# Patient Record
Sex: Female | Born: 1945 | Race: White | Hispanic: No | Marital: Married | Smoking: Never smoker
Health system: Southern US, Community
[De-identification: ages and names within clinical notes are randomized; demographics above are authoritative.]

## PROBLEM LIST (undated history)

## (undated) DIAGNOSIS — F419 Anxiety disorder, unspecified: Secondary | ICD-10-CM

## (undated) DIAGNOSIS — M1990 Unspecified osteoarthritis, unspecified site: Secondary | ICD-10-CM

## (undated) DIAGNOSIS — E669 Obesity, unspecified: Secondary | ICD-10-CM

## (undated) DIAGNOSIS — K589 Irritable bowel syndrome without diarrhea: Secondary | ICD-10-CM

## (undated) DIAGNOSIS — J309 Allergic rhinitis, unspecified: Secondary | ICD-10-CM

## (undated) DIAGNOSIS — G4762 Sleep related leg cramps: Secondary | ICD-10-CM

## (undated) DIAGNOSIS — G4752 REM sleep behavior disorder: Secondary | ICD-10-CM

## (undated) DIAGNOSIS — M5431 Sciatica, right side: Secondary | ICD-10-CM

## (undated) DIAGNOSIS — M4802 Spinal stenosis, cervical region: Secondary | ICD-10-CM

## (undated) DIAGNOSIS — G20 Parkinson's disease: Secondary | ICD-10-CM

## (undated) DIAGNOSIS — R413 Other amnesia: Secondary | ICD-10-CM

## (undated) DIAGNOSIS — F329 Major depressive disorder, single episode, unspecified: Secondary | ICD-10-CM

## (undated) DIAGNOSIS — E785 Hyperlipidemia, unspecified: Secondary | ICD-10-CM

## (undated) DIAGNOSIS — K219 Gastro-esophageal reflux disease without esophagitis: Secondary | ICD-10-CM

## (undated) DIAGNOSIS — L309 Dermatitis, unspecified: Secondary | ICD-10-CM

## (undated) DIAGNOSIS — L300 Nummular dermatitis: Principal | ICD-10-CM

## (undated) DIAGNOSIS — N812 Incomplete uterovaginal prolapse: Secondary | ICD-10-CM

## (undated) DIAGNOSIS — M549 Dorsalgia, unspecified: Secondary | ICD-10-CM

## (undated) DIAGNOSIS — B029 Zoster without complications: Secondary | ICD-10-CM

## (undated) DIAGNOSIS — I10 Essential (primary) hypertension: Secondary | ICD-10-CM

## (undated) DIAGNOSIS — Z8619 Personal history of other infectious and parasitic diseases: Secondary | ICD-10-CM

## (undated) DIAGNOSIS — M47812 Spondylosis without myelopathy or radiculopathy, cervical region: Secondary | ICD-10-CM

## (undated) DIAGNOSIS — I454 Nonspecific intraventricular block: Secondary | ICD-10-CM

## (undated) HISTORY — DX: Sciatica, right side: M54.31

## (undated) HISTORY — DX: Nonspecific intraventricular block: I45.4

## (undated) HISTORY — DX: Allergic rhinitis, unspecified: J30.9

## (undated) HISTORY — DX: REM sleep behavior disorder: G47.52

## (undated) HISTORY — DX: Irritable bowel syndrome, unspecified: K58.9

## (undated) HISTORY — DX: Major depressive disorder, single episode, unspecified: F32.9

## (undated) HISTORY — PX: ABDOMINAL HYSTERECTOMY: SHX81

## (undated) HISTORY — DX: Spinal stenosis, cervical region: M48.02

## (undated) HISTORY — DX: Zoster without complications: B02.9

## (undated) HISTORY — DX: Anxiety disorder, unspecified: F41.9

## (undated) HISTORY — DX: Hyperlipidemia, unspecified: E78.5

## (undated) HISTORY — DX: Nummular dermatitis: L30.0

## (undated) HISTORY — DX: Sleep related leg cramps: G47.62

## (undated) HISTORY — PX: LUMBAR LAMINECTOMY: SHX95

## (undated) HISTORY — DX: Spondylosis without myelopathy or radiculopathy, cervical region: M47.812

## (undated) HISTORY — DX: Personal history of other infectious and parasitic diseases: Z86.19

## (undated) HISTORY — DX: Dorsalgia, unspecified: M54.9

## (undated) HISTORY — DX: Other amnesia: R41.3

## (undated) HISTORY — DX: Dermatitis, unspecified: L30.9

## (undated) HISTORY — PX: OTHER SURGICAL HISTORY: SHX169

## (undated) HISTORY — DX: Incomplete uterovaginal prolapse: N81.2

## (undated) HISTORY — DX: Parkinson's disease: G20

## (undated) HISTORY — DX: Obesity, unspecified: E66.9

## (undated) HISTORY — DX: Unspecified osteoarthritis, unspecified site: M19.90

## (undated) HISTORY — DX: Essential (primary) hypertension: I10

## (undated) HISTORY — PX: CERVICAL LAMINECTOMY: SHX94

## (undated) HISTORY — DX: Gastro-esophageal reflux disease without esophagitis: K21.9

---

## 1998-10-21 ENCOUNTER — Inpatient Hospital Stay (HOSPITAL_COMMUNITY): Admission: EM | Admit: 1998-10-21 | Discharge: 1998-10-24 | Payer: Self-pay | Admitting: Emergency Medicine

## 1998-10-27 ENCOUNTER — Encounter: Admission: RE | Admit: 1998-10-27 | Discharge: 1998-10-27 | Payer: Self-pay | Admitting: Family Medicine

## 1998-11-22 ENCOUNTER — Other Ambulatory Visit: Admission: RE | Admit: 1998-11-22 | Discharge: 1998-11-22 | Payer: Self-pay | Admitting: Internal Medicine

## 2000-08-31 ENCOUNTER — Other Ambulatory Visit: Admission: RE | Admit: 2000-08-31 | Discharge: 2000-08-31 | Payer: Self-pay | Admitting: Obstetrics and Gynecology

## 2004-03-06 ENCOUNTER — Encounter: Admission: RE | Admit: 2004-03-06 | Discharge: 2004-03-06 | Payer: Self-pay | Admitting: Internal Medicine

## 2004-03-15 ENCOUNTER — Encounter: Admission: RE | Admit: 2004-03-15 | Discharge: 2004-03-15 | Payer: Self-pay | Admitting: Internal Medicine

## 2004-04-15 ENCOUNTER — Encounter: Admission: RE | Admit: 2004-04-15 | Discharge: 2004-04-15 | Payer: Self-pay | Admitting: Internal Medicine

## 2004-05-09 ENCOUNTER — Encounter: Admission: RE | Admit: 2004-05-09 | Discharge: 2004-08-07 | Payer: Self-pay | Admitting: Internal Medicine

## 2004-05-27 ENCOUNTER — Ambulatory Visit: Payer: Self-pay | Admitting: Internal Medicine

## 2004-07-03 ENCOUNTER — Ambulatory Visit: Payer: Self-pay | Admitting: Internal Medicine

## 2005-06-13 ENCOUNTER — Ambulatory Visit: Payer: Self-pay | Admitting: Internal Medicine

## 2005-06-18 ENCOUNTER — Ambulatory Visit (HOSPITAL_COMMUNITY): Admission: RE | Admit: 2005-06-18 | Discharge: 2005-06-18 | Payer: Self-pay | Admitting: Internal Medicine

## 2005-08-29 ENCOUNTER — Inpatient Hospital Stay (HOSPITAL_COMMUNITY): Admission: RE | Admit: 2005-08-29 | Discharge: 2005-09-02 | Payer: Self-pay | Admitting: Neurosurgery

## 2005-09-01 ENCOUNTER — Ambulatory Visit: Payer: Self-pay | Admitting: Internal Medicine

## 2006-01-22 ENCOUNTER — Ambulatory Visit: Payer: Self-pay | Admitting: Internal Medicine

## 2006-01-27 ENCOUNTER — Ambulatory Visit (HOSPITAL_COMMUNITY): Admission: RE | Admit: 2006-01-27 | Discharge: 2006-01-27 | Payer: Self-pay | Admitting: Internal Medicine

## 2006-02-19 ENCOUNTER — Ambulatory Visit: Payer: Self-pay | Admitting: Internal Medicine

## 2006-08-25 ENCOUNTER — Ambulatory Visit: Payer: Self-pay | Admitting: Internal Medicine

## 2006-09-09 ENCOUNTER — Telehealth: Payer: Self-pay | Admitting: Internal Medicine

## 2006-09-16 ENCOUNTER — Telehealth (INDEPENDENT_AMBULATORY_CARE_PROVIDER_SITE_OTHER): Payer: Self-pay | Admitting: *Deleted

## 2006-09-16 ENCOUNTER — Ambulatory Visit: Payer: Self-pay | Admitting: Hospitalist

## 2006-09-16 DIAGNOSIS — J309 Allergic rhinitis, unspecified: Secondary | ICD-10-CM

## 2006-09-16 DIAGNOSIS — Z9079 Acquired absence of other genital organ(s): Secondary | ICD-10-CM | POA: Insufficient documentation

## 2006-09-16 DIAGNOSIS — K219 Gastro-esophageal reflux disease without esophagitis: Secondary | ICD-10-CM | POA: Insufficient documentation

## 2006-09-16 DIAGNOSIS — I454 Nonspecific intraventricular block: Secondary | ICD-10-CM | POA: Insufficient documentation

## 2006-09-16 DIAGNOSIS — E785 Hyperlipidemia, unspecified: Secondary | ICD-10-CM

## 2006-09-16 DIAGNOSIS — I10 Essential (primary) hypertension: Secondary | ICD-10-CM | POA: Insufficient documentation

## 2006-09-16 HISTORY — DX: Allergic rhinitis, unspecified: J30.9

## 2006-09-17 ENCOUNTER — Telehealth (INDEPENDENT_AMBULATORY_CARE_PROVIDER_SITE_OTHER): Payer: Self-pay | Admitting: *Deleted

## 2006-10-12 ENCOUNTER — Telehealth: Payer: Self-pay | Admitting: *Deleted

## 2007-01-12 ENCOUNTER — Telehealth: Payer: Self-pay | Admitting: *Deleted

## 2007-02-25 ENCOUNTER — Ambulatory Visit (HOSPITAL_COMMUNITY): Admission: RE | Admit: 2007-02-25 | Discharge: 2007-02-25 | Payer: Self-pay | Admitting: Obstetrics and Gynecology

## 2007-03-08 ENCOUNTER — Telehealth (INDEPENDENT_AMBULATORY_CARE_PROVIDER_SITE_OTHER): Payer: Self-pay | Admitting: *Deleted

## 2007-03-25 ENCOUNTER — Encounter (INDEPENDENT_AMBULATORY_CARE_PROVIDER_SITE_OTHER): Payer: Self-pay | Admitting: Internal Medicine

## 2007-03-25 ENCOUNTER — Ambulatory Visit: Payer: Self-pay | Admitting: Internal Medicine

## 2007-04-02 LAB — CONVERTED CEMR LAB
BUN: 14 mg/dL (ref 6–23)
CO2: 28 meq/L (ref 19–32)
Calcium: 9.8 mg/dL (ref 8.4–10.5)
Chloride: 102 meq/L (ref 96–112)
Creatinine, Ser: 1.04 mg/dL (ref 0.40–1.20)
Glucose, Bld: 126 mg/dL — ABNORMAL HIGH (ref 70–99)
Potassium: 4.5 meq/L (ref 3.5–5.3)
Sodium: 141 meq/L (ref 135–145)

## 2007-04-06 ENCOUNTER — Telehealth (INDEPENDENT_AMBULATORY_CARE_PROVIDER_SITE_OTHER): Payer: Self-pay | Admitting: Internal Medicine

## 2007-04-07 ENCOUNTER — Ambulatory Visit: Payer: Self-pay | Admitting: Internal Medicine

## 2007-04-07 LAB — CONVERTED CEMR LAB
Blood Glucose, Fingerstick: 143
Hgb A1c MFr Bld: 5.9 %

## 2007-04-12 LAB — CONVERTED CEMR LAB
Cholesterol: 225 mg/dL — ABNORMAL HIGH (ref 0–200)
HDL: 47 mg/dL (ref >39)
LDL Cholesterol: 136 mg/dL — ABNORMAL HIGH (ref 0–99)
Total CHOL/HDL Ratio: 4.8
Triglycerides: 211 mg/dL — ABNORMAL HIGH (ref <150)
VLDL: 42 mg/dL — ABNORMAL HIGH (ref 0–40)

## 2007-04-13 ENCOUNTER — Ambulatory Visit: Payer: Self-pay | Admitting: Internal Medicine

## 2007-04-13 LAB — CONVERTED CEMR LAB

## 2007-05-05 ENCOUNTER — Ambulatory Visit: Payer: Self-pay | Admitting: Internal Medicine

## 2007-05-05 LAB — CONVERTED CEMR LAB: Blood Glucose, Fingerstick: 98

## 2007-05-25 ENCOUNTER — Ambulatory Visit (HOSPITAL_COMMUNITY): Admission: RE | Admit: 2007-05-25 | Discharge: 2007-05-25 | Payer: Self-pay | Admitting: Gastroenterology

## 2007-05-25 ENCOUNTER — Encounter (INDEPENDENT_AMBULATORY_CARE_PROVIDER_SITE_OTHER): Payer: Self-pay | Admitting: Internal Medicine

## 2007-06-16 ENCOUNTER — Ambulatory Visit: Payer: Self-pay | Admitting: Internal Medicine

## 2007-06-16 LAB — CONVERTED CEMR LAB: Blood Glucose, Fingerstick: 112

## 2007-06-22 LAB — CONVERTED CEMR LAB
Cholesterol: 221 mg/dL — ABNORMAL HIGH (ref 0–200)
HDL: 52 mg/dL (ref >39)
LDL Cholesterol: 143 mg/dL — ABNORMAL HIGH (ref 0–99)
Total CHOL/HDL Ratio: 4.3
Triglycerides: 130 mg/dL (ref <150)
VLDL: 26 mg/dL (ref 0–40)

## 2007-08-02 ENCOUNTER — Telehealth (INDEPENDENT_AMBULATORY_CARE_PROVIDER_SITE_OTHER): Payer: Self-pay | Admitting: *Deleted

## 2007-10-14 ENCOUNTER — Telehealth (INDEPENDENT_AMBULATORY_CARE_PROVIDER_SITE_OTHER): Payer: Self-pay | Admitting: Pharmacy Technician

## 2007-11-15 ENCOUNTER — Telehealth: Payer: Self-pay | Admitting: *Deleted

## 2008-03-09 ENCOUNTER — Ambulatory Visit: Payer: Self-pay | Admitting: Internal Medicine

## 2008-03-10 LAB — CONVERTED CEMR LAB
BUN: 18 mg/dL (ref 6–23)
CO2: 25 meq/L (ref 19–32)
Calcium: 10.2 mg/dL (ref 8.4–10.5)
Chloride: 100 meq/L (ref 96–112)
Cholesterol: 242 mg/dL — ABNORMAL HIGH (ref 0–200)
Creatinine, Ser: 0.95 mg/dL (ref 0.40–1.20)
Glucose, Bld: 103 mg/dL — ABNORMAL HIGH (ref 70–99)
HDL: 63 mg/dL (ref >39)
LDL Cholesterol: 153 mg/dL — ABNORMAL HIGH (ref 0–99)
Potassium: 3.9 meq/L (ref 3.5–5.3)
Sodium: 139 meq/L (ref 135–145)
Total CHOL/HDL Ratio: 3.8
Triglycerides: 130 mg/dL (ref <150)
VLDL: 26 mg/dL (ref 0–40)

## 2008-03-23 ENCOUNTER — Ambulatory Visit (HOSPITAL_COMMUNITY): Admission: RE | Admit: 2008-03-23 | Discharge: 2008-03-23 | Payer: Self-pay | Admitting: Internal Medicine

## 2008-03-23 LAB — HM MAMMOGRAPHY: HM Mammogram: NEGATIVE

## 2008-05-25 ENCOUNTER — Telehealth (INDEPENDENT_AMBULATORY_CARE_PROVIDER_SITE_OTHER): Payer: Self-pay | Admitting: Internal Medicine

## 2008-05-29 ENCOUNTER — Telehealth (INDEPENDENT_AMBULATORY_CARE_PROVIDER_SITE_OTHER): Payer: Self-pay | Admitting: Internal Medicine

## 2008-07-05 ENCOUNTER — Telehealth (INDEPENDENT_AMBULATORY_CARE_PROVIDER_SITE_OTHER): Payer: Self-pay | Admitting: Internal Medicine

## 2008-07-27 ENCOUNTER — Telehealth: Payer: Self-pay | Admitting: *Deleted

## 2008-08-08 ENCOUNTER — Telehealth (INDEPENDENT_AMBULATORY_CARE_PROVIDER_SITE_OTHER): Payer: Self-pay | Admitting: Internal Medicine

## 2008-12-08 ENCOUNTER — Telehealth (INDEPENDENT_AMBULATORY_CARE_PROVIDER_SITE_OTHER): Payer: Self-pay | Admitting: Internal Medicine

## 2009-03-07 ENCOUNTER — Telehealth (INDEPENDENT_AMBULATORY_CARE_PROVIDER_SITE_OTHER): Payer: Self-pay | Admitting: Internal Medicine

## 2009-07-09 ENCOUNTER — Telehealth: Payer: Self-pay | Admitting: Internal Medicine

## 2009-07-31 ENCOUNTER — Telehealth: Payer: Self-pay | Admitting: Internal Medicine

## 2009-08-18 DIAGNOSIS — F329 Major depressive disorder, single episode, unspecified: Secondary | ICD-10-CM | POA: Insufficient documentation

## 2009-08-18 DIAGNOSIS — F32A Depression, unspecified: Secondary | ICD-10-CM | POA: Insufficient documentation

## 2009-08-18 HISTORY — DX: Depression, unspecified: F32.A

## 2009-09-04 ENCOUNTER — Telehealth: Payer: Self-pay | Admitting: Internal Medicine

## 2009-09-11 ENCOUNTER — Ambulatory Visit: Payer: Self-pay | Admitting: Internal Medicine

## 2009-09-11 DIAGNOSIS — B029 Zoster without complications: Secondary | ICD-10-CM

## 2009-09-11 HISTORY — DX: Zoster without complications: B02.9

## 2009-09-11 LAB — CONVERTED CEMR LAB
Blood Glucose, Fingerstick: 147
Hgb A1c MFr Bld: 5.6 %

## 2009-09-12 ENCOUNTER — Encounter: Payer: Self-pay | Admitting: Internal Medicine

## 2009-10-18 ENCOUNTER — Telehealth: Payer: Self-pay | Admitting: Internal Medicine

## 2010-02-11 ENCOUNTER — Telehealth: Payer: Self-pay | Admitting: Internal Medicine

## 2010-03-28 ENCOUNTER — Telehealth: Payer: Self-pay | Admitting: Internal Medicine

## 2010-05-27 ENCOUNTER — Ambulatory Visit: Payer: Self-pay | Admitting: Internal Medicine

## 2010-05-27 LAB — CONVERTED CEMR LAB
ALT: 14 units/L (ref 0–35)
Albumin: 4.6 g/dL (ref 3.5–5.2)
Blood Glucose, Fingerstick: 120
CO2: 26 meq/L (ref 19–32)
Glucose, Bld: 102 mg/dL — ABNORMAL HIGH (ref 70–99)
Hgb A1c MFr Bld: 5.7 %
Potassium: 3.8 meq/L (ref 3.5–5.3)
Sodium: 140 meq/L (ref 135–145)
TSH: 1.38 microintl units/mL (ref 0.350–4.5)
Total Protein: 8.1 g/dL (ref 6.0–8.3)

## 2010-06-06 ENCOUNTER — Ambulatory Visit: Payer: Self-pay | Admitting: Internal Medicine

## 2010-06-07 ENCOUNTER — Encounter: Payer: Self-pay | Admitting: Internal Medicine

## 2010-07-09 ENCOUNTER — Ambulatory Visit: Payer: Self-pay | Admitting: Internal Medicine

## 2010-07-09 DIAGNOSIS — M549 Dorsalgia, unspecified: Secondary | ICD-10-CM | POA: Insufficient documentation

## 2010-07-09 HISTORY — DX: Dorsalgia, unspecified: M54.9

## 2010-07-10 ENCOUNTER — Telehealth (INDEPENDENT_AMBULATORY_CARE_PROVIDER_SITE_OTHER): Payer: Self-pay | Admitting: *Deleted

## 2010-07-18 ENCOUNTER — Ambulatory Visit (HOSPITAL_COMMUNITY)
Admission: RE | Admit: 2010-07-18 | Discharge: 2010-07-18 | Payer: Self-pay | Source: Home / Self Care | Admitting: Internal Medicine

## 2010-07-18 ENCOUNTER — Ambulatory Visit: Payer: Self-pay | Admitting: Internal Medicine

## 2010-07-25 ENCOUNTER — Ambulatory Visit: Payer: Self-pay | Admitting: Internal Medicine

## 2010-07-25 LAB — CONVERTED CEMR LAB: Blood Glucose, Fingerstick: 142

## 2010-07-26 ENCOUNTER — Telehealth: Payer: Self-pay | Admitting: Internal Medicine

## 2010-08-05 ENCOUNTER — Telehealth: Payer: Self-pay | Admitting: *Deleted

## 2010-08-07 ENCOUNTER — Ambulatory Visit: Payer: Self-pay | Admitting: Internal Medicine

## 2010-09-08 ENCOUNTER — Encounter: Payer: Self-pay | Admitting: Internal Medicine

## 2010-09-09 ENCOUNTER — Telehealth: Payer: Self-pay | Admitting: Internal Medicine

## 2010-09-16 ENCOUNTER — Telehealth: Payer: Self-pay | Admitting: Internal Medicine

## 2010-09-17 NOTE — Assessment & Plan Note (Signed)
Summary: STOMACH/SB.   Vital Signs:  Patient profile:   65 year old female Height:      61 inches (154.94 cm) Weight:      159.04 pounds (72.29 kg) BMI:     30.16 Temp:     97.6 degrees F (36.44 degrees C) oral Pulse rate:   94 / minute BP sitting:   140 / 80  (right arm)  Vitals Entered By: Angelina Ok RN (May 27, 2010 11:39 AM)   CC: Depression Is Patient Diabetic? No Pain Assessment Patient in pain? no      Nutritional Status BMI of > 30 = obese CBG Result 120  Have you ever been in a relationship where you felt threatened, hurt or afraid?No   Does patient need assistance? Functional Status Self care Ambulation Normal Comments Indigestion all the time   CC:  Depression.  History of Present Illness: 65 yr old woman with pmhx as described below comes to the clinic complaining of indigestion. Patient is taking omeprazole once a day but its not helping. Patient has alot of stress in her life. Patient takes advil most  days of the week. (3 tablets two times a day) for neck pain and back pain. Patient has had epigastric pain.   Depression History:      The patient is having a depressed mood most of the day but denies diminished interest in her usual daily activities.        The patient denies that she feels like life is not worth living, denies that she wishes that she were dead, and denies that she has thought about ending her life.        Comments:  On pills for.  Wants something for depression.   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 Use 1 Spray in Each Nostril Once A Day 2)  Aspirin 81 Mg Tbec (Aspirin) .... Take 1 Tablet By Mouth Once A Day 3)  Quinapril Hcl 20 Mg Tabs (Quinapril Hcl) .... Take 1/2 A Pill A Day 4)  Alprazolam 0.5 Mg  Tabs (Alprazolam) .... 1/2 Tablet Three Times A Day As Needed 5)  Pantoprazole Sodium 20 Mg Tbec (Pantoprazole Sodium) .... One Pill A Day 6)  Omeprazole 20 Mg Tbec (Omeprazole) .... Take 1 Tablet By Mouth Once A Day 7)  Triamterene-Hctz 75-50 Mg Tabs (Triamterene-Hctz) .... 1/4- 1/2 of A Pill One A Day As Needed  Current Medications (verified): 1)  Nasonex 50 Mcg/act Susp (Mometasone Furoate) .... Use 1 Spray in Each Nostril Once A Day 2)  Aspirin 81 Mg Tbec (Aspirin) .... Take 1 Tablet By Mouth Once A Day 3)  Quinapril Hcl 20 Mg Tabs (Quinapril Hcl) .Marland KitchenMarland KitchenMarland Kitchen  Take 1/2 A Pill A Day 4)  Alprazolam 0.5 Mg  Tabs (Alprazolam) .... 1/2 Tablet Three Times A Day As Needed 5)  Pantoprazole Sodium 20 Mg Tbec (Pantoprazole Sodium) .... One Pill A Day 6)  Omeprazole 20 Mg Tbec (Omeprazole) .... Take 1 Tablet By Mouth Once A Day 7)  Triamterene-Hctz 75-50 Mg Tabs (Triamterene-Hctz) .... 1/4- 1/2 of A Pill One A Day As Needed  Allergies: 1)  ! Prednisone 2)  ! * Pineapple 3)  ! 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, CERVICAL SPINAL (ICD-723.0) HEALTH SCREENING (ICD-V70.0) HYPERTENSION (ICD-401.9) HYPERLIPIDEMIA (ICD-272.4) GERD (ICD-530.81) ALLERGIC RHINITIS (ICD-477.9) IRRITABLE BOWEL SYNDROME (ICD-564.1) BUNDLE  BRANCH BLOCK (ICD-426.50) DEGENERATIVE JOINT DISEASE (ICD-715.90) HYSTERECTOMY, HX OF (ICD-V45.77)  Past Surgical History: Last updated: 09/16/2006 Hysterectomy  Risk Factors: Alcohol Use: 0 (05/27/2010) Exercise: no (09/11/2009)  Risk Factors: Smoking Status: never (05/27/2010)  Review of Systems  The patient denies fever, chest pain, dyspnea on exertion, headaches, hemoptysis, melena, hematochezia, hematuria, muscle weakness, and difficulty walking.    Physical Exam  General:  alert and well-developed.   Mouth:  MMM Lungs:  normal breath sounds.   Heart:  normal rate and regular rhythm.   Abdomen:  ttp epigastric area, soft and normal bowel sounds.   Msk:  normal ROM.   Extremities:  no edema Neurologic:  Nonfocal    Impression & Recommendations:  Problem # 1:  GERD (ICD-530.81) Start patient on ppi two times a day. Rule out H.pylori infection. Further management per PCP on follow up.  The following medications were removed from the medication list:    Omeprazole 20 Mg Tbec (Omeprazole) .Marland Kitchen... Take 1 tablet by mouth once a day Her updated medication list for this problem includes:    Pantoprazole Sodium 20 Mg Tbec (Pantoprazole sodium) .Marland Kitchen... Take 1 tablet by mouth two times a day once before breakfast and the other before dinner  Orders: T-Stool for Helicobacter Pylori ([id number])  Problem # 2:  DEGENERATIVE JOINT DISEASE (ICD-715.90) Patient was instructed to stop taking advil or any other NSAIDs and to start using Tramadol for pain.  Her updated medication list for this problem includes:    Aspirin 81 Mg Tbec (Aspirin) .Marland Kitchen... Take 1 tablet by mouth once a day    Tramadol Hcl 50 Mg Tabs (Tramadol hcl) .Marland Kitchen... Take 1 tablet by mouth two times a day as needed for pain  Problem # 3:  HYPERTENSION (ICD-401.9) Continue current regimen.  Her updated medication list for this problem includes:    Quinapril Hcl 20 Mg Tabs (Quinapril hcl) .Marland Kitchen... Take 1/2 a pill a day     Triamterene-hctz 75-50 Mg Tabs (Triamterene-hctz) .Marland Kitchen... 1/4- 1/2 of a pill one a day as needed  Orders: T-CMP with Estimated GFR ([address]) T-TSH (60109-32355)  BP today: 140/80 Prior BP: 110/64 (09/11/2009)  Labs Reviewed: K+: 3.9 (03/09/2008) Creat: : 0.95 (03/09/2008)   Chol: 242 (03/09/2008)   HDL: 63 (03/09/2008)   LDL: 153 (03/09/2008)   TG: 130 (03/09/2008)  Problem # 4:  HYPERLIPIDEMIA (ICD-272.4) Patient not fasting. Check FLP on follow up.    HDL:63 (03/09/2008), 52 (06/16/2007)  LDL:153 (03/09/2008), 143 (06/16/2007)  Chol:242 (03/09/2008), 221 (06/16/2007)  Trig:130 (03/09/2008), 130 (06/16/2007)  Problem # 5:  AODM (ICD-250.00) Would not consider patient to be diabetic at this time. Continue diet control.   Her updated medication list for this problem includes:    Aspirin 81 Mg  Tbec (Aspirin) .Marland Kitchen... Take 1 tablet by mouth once a day    Quinapril Hcl 20 Mg Tabs (Quinapril hcl) .Marland Kitchen... Take 1/2 a pill a day  Orders: T- Capillary Blood Glucose (82948) T-Hgb A1C (in-house) (16109UE)  Complete Medication List: 1)  Nasonex 50 Mcg/act Susp (Mometasone furoate) .... Use 1 spray in each nostril once a day 2)  Aspirin 81 Mg Tbec (Aspirin) .... Take 1 tablet by mouth once a day 3)  Quinapril Hcl 20 Mg Tabs (Quinapril hcl) .... Take 1/2 a pill a day 4)  Alprazolam 0.5 Mg Tabs (Alprazolam) .... 1/2 tablet three times a day as needed 5)  Pantoprazole Sodium 20 Mg Tbec (Pantoprazole sodium) .... Take 1 tablet by mouth two times a day once before breakfast and the other before dinner 6)  Triamterene-hctz 75-50 Mg Tabs (Triamterene-hctz) .... 1/4- 1/2 of a pill one a day as needed 7)  Tramadol Hcl 50 Mg Tabs (Tramadol hcl) .... Take 1 tablet by mouth two times a day as needed for pain  Patient Instructions: 1)  Please follow up with PCP in November.  2)  Take all medications as directed. 3)  You will be called with any abnormalities in the tests scheduled or performed today.  If  you don't hear from [place] within a week from when the test was performed, you can assume that your test was normal.  Prescriptions: PANTOPRAZOLE SODIUM 20 MG TBEC (PANTOPRAZOLE SODIUM) Take 1 tablet by mouth two times a day once before breakfast and the other before dinner  #60 x 1   Entered and Authorized by:   Laren Everts MD   Signed by:   Laren Everts MD on 05/27/2010   Method used:   Print then Give to Patient   RxID:   [id number] ALPRAZOLAM 0.5 MG  TABS (ALPRAZOLAM) 1/2 tablet three times a day as needed  #45 x 0   Entered and Authorized by:   Laren Everts MD   Signed by:   Laren Everts MD on 05/27/2010   Method used:   Print then Give to Patient   RxID:   [id number] TRAMADOL HCL 50 MG TABS (TRAMADOL HCL) Take 1 tablet by mouth two times a day as needed for pain  #60 x 1   Entered and Authorized by:   Laren Everts MD   Signed by:   Laren Everts MD on 05/27/2010   Method used:   Print then Give to Patient   RxID:   [id number]   Prevention & Chronic Care Immunizations   Influenza vaccine: Fluvax 3+  (09/11/2009)    Tetanus booster: Not documented   Td booster deferral: Deferred  (09/11/2009)    Pneumococcal vaccine: Not documented    H. zoster vaccine: Not documented  Colorectal Screening   Hemoccult: Not documented    Colonoscopy: Not documented  Other Screening   Pap smear: Not documented   Pap smear action/deferral: Not indicated S/P hysterectomy  (09/11/2009)    Mammogram: Assessment: BIRADS 1.   (03/23/2008)   Mammogram action/deferral: Ordered  (09/11/2009)    DXA bone density scan: Not documented   Smoking status: never  (05/27/2010)  Diabetes Mellitus   HgbA1C: 5.7  (05/27/2010)   Hemoglobin A1C due: 07/08/2007    Eye exam: Not documented    Foot exam: yes  (09/11/2009)   Foot exam action/deferral: Do today   High risk foot: No  (09/11/2009)   Foot care education: Done   (09/11/2009)    Urine microalbumin/creatinine ratio: Not  documented   Urine microalbumin action/deferral: Deferred    Diabetes flowsheet reviewed?: Yes   Progress toward A1C goal: At goal  Lipids   Total Cholesterol: 242  (03/09/2008)   LDL: 153  (03/09/2008)   LDL Direct: Not documented   HDL: 63  (03/09/2008)   Triglycerides: 130  (03/09/2008)    SGOT (AST): Not documented   SGPT (ALT): Not documented   Alkaline phosphatase: Not documented   Total bilirubin: Not documented    Lipid flowsheet reviewed?: Yes   Progress toward LDL goal: Unchanged  Hypertension   Last Blood Pressure: 140 / 80  (05/27/2010)   Serum creatinine: 0.95  (03/09/2008)   Serum potassium 3.9  (03/09/2008)    Hypertension flowsheet reviewed?: Yes   Progress toward BP goal: At goal  Self-Management Support :   Personal Goals (by the next clinic visit) :     Personal A1C goal: 6  (05/27/2010)     Personal blood pressure goal: 140/90  (05/27/2010)     Personal LDL goal: 100  (05/27/2010)    Patient will work on the following items until the next clinic visit to reach self-care goals:     Medications and monitoring: take my medicines every day, bring all of my medications to every visit  (05/27/2010)     Eating: drink diet soda or water instead of juice or soda, eat more vegetables, use fresh or frozen vegetables, eat foods that are low in salt, eat baked foods instead of fried foods, eat fruit for snacks and desserts, limit or avoid alcohol  (05/27/2010)     Activity: take a 30 minute walk every day  (05/27/2010)    Diabetes self-management support: CBG self-monitoring log, Written self-care plan, Education handout, Pre-printed educational material, Resources for patients handout  (05/27/2010)   Diabetes care plan printed   Diabetes education handout printed   Last diabetes self-management training by diabetes educator: 05/05/2007   Last medical nutrition therapy: 04/13/2007    Hypertension  self-management support: Written self-care plan, Education handout, Pre-printed educational material, Resources for patients handout  (05/27/2010)   Hypertension self-care plan printed.   Hypertension education handout printed    Lipid self-management support: Written self-care plan, Education handout, Pre-printed educational material, Resources for patients handout  (05/27/2010)   Lipid self-care plan printed.   Lipid education handout printed      Resource handout printed.   Laboratory Results   Blood Tests   Date/Time Received: May 27, 2010 12:12 PM  Date/Time Reported: Burke Keels  May 27, 2010 12:12 PM   HGBA1C: 5.7%   (Normal Range: Non-Diabetic - 3-6%   Control Diabetic - 6-8%) CBG Random:: 120mg /dL      Process Orders Check Orders Results:     [facility]: ABN not required for this insurance Tests Sent for requisitioning (May 27, 2010 4:17 PM):     05/27/2010: [facility] -- T-Stool for Helicobacter Pylori [phone] (signed)     05/27/2010: [facility] -- T-CMP with Estimated GFR [[id number]] (signed)     05/27/2010: [facility] -- T-TSH [phone] (signed)

## 2010-09-17 NOTE — Assessment & Plan Note (Signed)
Summary: CHECKUP/SB.   Vital Signs:  Patient profile:   65 year old female Height:      61 inches (154.94 cm) Weight:      155.4 pounds (70.64 kg) BMI:     29.47 Temp:     97.5 degrees F (36.39 degrees C) oral Pulse rate:   74 / minute BP sitting:   119 / 63  (left arm) Cuff size:   regular  Vitals Entered By: Cynda Familia Duncan Dull) (July 09, 2010 9:55 AM) CC: routine f/u, Depression Is Patient Diabetic? No Pain Assessment Patient in pain? no      Nutritional Status BMI of 25 - 29 = overweight  Have you ever been in a relationship where you felt threatened, hurt or afraid?No   Does patient need assistance? Functional Status Self care Ambulation Normal   CC:  routine f/u and Depression.  History of Present Illness: 65 yr old who comes in for follow up and new concerns.  1. HTN: This is well controlled today.  2. Diabetes mellitus: This has been diet controlled. HbA1c was 5.7 in 10/11.  3. Hyperlipidemia: She keeps forgetting to come fasting, but liver enzymes were normal 10/11.  4. Epigastric pain: This brought her in 10/11. Her omeprazole was changed to two times a day and she is doing better. As well  she was told to stop the advil she had been taking daily for her back and was prescribed tramadol. This was just prescribed two times a day as needed and while it "takes the edge off" she still has back pain.  5. Depression: Since I last saw Christina Stevenson 1/11, she has had MANY stressors. Her son Thayer Ohm suffered a CVA, was hospitalized, suffered another CVA, had a trach and a feeding tube placed, was sent to a facility, pulled the trach out, had a janitor put it back in, was rehospitalized, had pneumonia, was sent to palliative care, decision was made to d/c trach and feeding tube, he lived, and now he is in a rehab facility where he says 3 words and is undergoing slow rehab. She walks him in a wheel-chair 1 1/2-2 hours a day. During that time her husbnad lost his job,  and got a new one, they lost 2 pets of 12 and 20 years, they were forced to refinance their house, etc.    She is doing amazingly well through all of this. She does sleep at night when she takes a full .5mg  Xanax which I told her is fine. Her appetite is decreased. She gets, understandably, tearful talking about her son and his future. She says he continues to prove everyone around him wrong. She denies suicidal ideation. She definitely feels over-whelmed at times.  Depression History:      The patient is having a depressed mood most of the day and has a diminished interest in her usual daily activities.        The patient denies that she has thought about ending her life.         Preventive Screening-Counseling & Management  Caffeine-Diet-Exercise     Does Patient Exercise: yes  Current Medications (verified): 1)  Nasonex 50 Mcg/act Susp (Mometasone Furoate) .... Use 1 Spray in Each Nostril Once A Day 2)  Aspirin 81 Mg Tbec (Aspirin) .... Take 1 Tablet By Mouth Once A Day 3)  Quinapril Hcl 20 Mg Tabs (Quinapril Hcl) .... Take 1/2 A Pill A Day 4)  Alprazolam 0.5 Mg  Tabs (Alprazolam) .... 1/2 Tablet  Three Times A Day As Needed 5)  Pantoprazole Sodium 20 Mg Tbec (Pantoprazole Sodium) .... Take 1 Tablet By Mouth Two Times A Day Once Before Breakfast and The Other Before Dinner 6)  Triamterene-Hctz 75-50 Mg Tabs (Triamterene-Hctz) .... 1/4- 1/2 of A Pill One A Day As Needed 7)  Tramadol Hcl 50 Mg Tabs (Tramadol Hcl) .... Take 1 Tablet By Mouth Two Times A Day As Needed For Pain 8)  Zostavax 16109 Unt/0.90ml Solr (Zoster Vaccine Live) .... Inject Im X 1  Allergies: 1)  ! Prednisone 2)  ! * Pineapple 3)  ! * Strawberries  Past History:  Past Medical History: Reviewed history from 09/16/2006 and no changes required. 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 CV:  Denies chest pain or discomfort and palpitations. Resp:  Denies cough and shortness of breath. GI:  Denies abdominal pain. Psych:  Complains of depression.  Physical Exam  General:  alert.   Head:  normocephalic and atraumatic.   Eyes:  vision grossly intact.   Lungs:  normal respiratory effort and normal breath sounds.   Heart:  normal rate and regular rhythm.   Abdomen:  soft, non-tender, and normal bowel sounds.   Extremities:  no edema Psych:  Oriented X3 and memory intact for recent and remote.  tearful at times talking about her son   Impression & Recommendations:  Problem # 1:  DEPRESSION (ICD-311) I will begin sertaline 100mg . Will start with 1/2 a day for 6 days, then increase to 1 a day. Risks, benefits and side effects explained to Christina Stevenson. I believe there is a very good chance that she will benefit from this. She will follow up in a few weeks. I have told her it is fine for her to continue the Xanax 1 at night for now. If she continues to need something once sertaline is in her system I might consider switching to [place]. Her updated medication list for this problem includes:    Alprazolam 0.5 Mg Tabs (Alprazolam) .Marland Kitchen... 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 As well she has lost 9 pounds as she has been walking her son daily. This is encouraged. Her updated medication list for this problem includes:    Aspirin 81 Mg Tbec (Aspirin) .Marland Kitchen... Take 1 tablet by mouth once  a day    Quinapril Hcl 20 Mg Tabs (Quinapril hcl) .Marland Kitchen... Take 1/2 a pill a day  Problem # 3:  HYPERTENSION (ICD-401.9) BP well controlled. Continue present regimen. Her updated medication list for this problem includes:    Quinapril Hcl 20 Mg Tabs (Quinapril hcl) .Marland Kitchen... Take 1/2 a pill a day    Triamterene-hctz 75-50 Mg Tabs (Triamterene-hctz) .Marland Kitchen... 1/4- 1/2 of a pill one a day as needed  Problem # 4:  HERPES ZOSTER (ICD-053.9) Prescription given for Zostavax. Patient has had shingles in the last year and should definitley have immunization though I am not sure that it is covered by her insurance.  Problem # 5:  GERD (ICD-530.81) This is doing well  on two times a day proton pump inhibitor.  Her updated medication list for this problem includes:    Pantoprazole Sodium 20 Mg Tbec (Pantoprazole sodium) .Marland Kitchen... Take 1 tablet by mouth two times a day once before breakfast and the other before dinner  Problem # 6:  BACK PAIN (ICD-724.5) Will increase the tramadol dose up to 2 qid as needed  Her updated medication list for this problem includes:    Aspirin 81 Mg Tbec (Aspirin) .Marland Kitchen... Take 1 tablet by mouth once a day    Tramadol Hcl 50 Mg Tabs (Tramadol hcl) .Marland Kitchen... Take 1 tablet by mouth two times a day as needed for pain  Complete Medication List: 1)  Nasonex 50 Mcg/act Susp (Mometasone furoate) .... Use 1 spray in each nostril once a day 2)  Aspirin 81 Mg Tbec (Aspirin) .... Take 1 tablet by mouth once a day 3)  Quinapril Hcl 20 Mg Tabs (Quinapril hcl) .... Take 1/2 a pill a day 4)  Alprazolam 0.5 Mg Tabs (Alprazolam) .... 1/2 tablet three times a day as needed 5)  Pantoprazole Sodium 20 Mg Tbec (Pantoprazole sodium) .... Take 1 tablet by mouth two times a day once before breakfast and the other before dinner 6)  Triamterene-hctz 75-50 Mg Tabs (Triamterene-hctz) .... 1/4- 1/2 of a pill one a day as needed 7)  Tramadol Hcl 50 Mg Tabs (Tramadol hcl) .... Take 1 tablet by mouth two times a day as  needed for pain 8)  Zostavax 16109 Unt/0.26ml Solr (Zoster vaccine live) .... Inject im x 1  Patient Instructions: 1)  Please schedule a follow-up appointment in 4-5 weeks. 2)  I am going to start Sertaline for depression. Please take 1/2  tablet a day for 6 days, then start taking one a day. 3)  You can take as many as 2 tramadol 4 times a day as needed for your back pain. 4)  You are amazing, keep up the good work!!! Prescriptions: ZOSTAVAX [id number] UNT/0.65ML SOLR (ZOSTER VACCINE LIVE) inject IM x 1  #1 x 0   Entered by:   Cynda Familia (AAMA)   Authorized by:   Zoila Shutter MD   Signed by:   Cynda Familia (AAMA) on 07/09/2010   Method used:   Reprint   RxID:   [id number] ZOSTAVAX [id number] UNT/0.65ML SOLR (ZOSTER VACCINE LIVE) inject IM x 1  #1 x 0   Entered by:   Cynda Familia (AAMA)   Authorized by:   Zoila Shutter MD   Signed by:   Cynda Familia (Duncan Dull) on 07/09/2010   Method used:   Print then Give to Patient   RxID:   [id number]    Orders Added: 1)  Est. Patient Level V [40102]     Prevention & Chronic Care Immunizations   Influenza vaccine: Fluvax 3+  (09/11/2009)    Tetanus booster: Not documented   Td booster deferral: Deferred  (09/11/2009)    Pneumococcal vaccine: Not documented    H. zoster vaccine: Not documented  Colorectal Screening   Hemoccult: Not documented    Colonoscopy: Not documented  Other Screening   Pap smear: Not documented   Pap smear action/deferral: Not indicated S/P hysterectomy  (09/11/2009)    Mammogram: Assessment: BIRADS 1.   (03/23/2008)   Mammogram action/deferral: Ordered  (09/11/2009)    DXA bone density scan: Not documented   Smoking status: never  (05/27/2010)  Diabetes Mellitus   HgbA1C: 5.7  (05/27/2010)   Hemoglobin A1C due: 07/08/2007  Eye exam: Not documented    Foot exam: yes  (09/11/2009)   Foot exam action/deferral: Do today   High risk foot: No  (09/11/2009)   Foot care  education: Done  (09/11/2009)    Urine microalbumin/creatinine ratio: Not documented   Urine microalbumin action/deferral: Deferred    Diabetes flowsheet reviewed?: Yes   Progress toward A1C goal: At goal  Lipids   Total Cholesterol: 242  (03/09/2008)   LDL: 153  (03/09/2008)   LDL Direct: Not documented   HDL: 63  (03/09/2008)   Triglycerides: 130  (03/09/2008)    SGOT (AST): 16  (05/27/2010)   SGPT (ALT): 14  (05/27/2010)   Alkaline phosphatase: 61  (05/27/2010)   Total bilirubin: 0.6  (05/27/2010)  Hypertension   Last Blood Pressure: 119 / 63  (07/09/2010)   Serum creatinine: 0.97  (05/27/2010)   Serum potassium 3.8  (05/27/2010)    Hypertension flowsheet reviewed?: Yes   Progress toward BP goal: At goal  Self-Management Support :   Personal Goals (by the next clinic visit) :     Personal A1C goal: 6  (05/27/2010)     Personal blood pressure goal: 140/90  (05/27/2010)     Personal LDL goal: 100  (05/27/2010)    Diabetes self-management support: CBG self-monitoring log, Written self-care plan, Education handout, Pre-printed educational material, Resources for patients handout  (05/27/2010)   Last diabetes self-management training by diabetes educator: 05/05/2007   Last medical nutrition therapy: 04/13/2007    Hypertension self-management support: Written self-care plan, Education handout, Pre-printed educational material, Resources for patients handout  (05/27/2010)    Lipid self-management support: Written self-care plan, Education handout, Pre-printed educational material, Resources for patients handout  (05/27/2010)

## 2010-09-17 NOTE — Progress Notes (Signed)
Summary: refill/gg  Phone Note Refill Request  on March 28, 2010 11:06 AM  Refills Requested: Medication #1:  ALPRAZOLAM 0.5 MG  TABS 1/2 tablet three times a day as needed   Last Refilled: 02/26/2010  Method Requested: Fax to Local Pharmacy Initial call taken by: Merrie Roof RN,  March 28, 2010 11:06 AM  Follow-up for Phone Call        I will give one refill, then pt. needs to come in. She is over-due for follow-up. Follow-up by: Zoila Shutter MD,  March 28, 2010 2:39 PM  Additional Follow-up for Phone Call Additional follow up Details #1::        Rx called to pharmacy Additional Follow-up by: Merrie Roof RN,  March 28, 2010 3:37 PM    Prescriptions: ALPRAZOLAM 0.5 MG  TABS (ALPRAZOLAM) 1/2 tablet three times a day as needed  #45 x 0   Entered and Authorized by:   Zoila Shutter MD   Signed by:   Zoila Shutter MD on 03/28/2010   Method used:   Telephoned to ...       [facility] [phone]* (retail)       [address] [place], [place]  [address]       Ph: [phone] or [phone]       Fax: [phone]   RxID:   [id number]

## 2010-09-17 NOTE — Assessment & Plan Note (Signed)
Summary: ACUTE/WOODYEAR/1 WEEK RECHECK/CH   Vital Signs:  Patient profile:   65 year old female Height:      61 inches (154.94 cm) Weight:      156.7 pounds (71.23 kg) BMI:     29.72 Temp:     97.1 degrees F (36.17 degrees C) oral Pulse rate:   99 / minute BP sitting:   107 / 61  (left arm)  Vitals Entered By: Stanton Kidney Ditzler RN (July 25, 2010 9:14 AM) Is Patient Diabetic? Yes Did you bring your meter with you today? No Pain Assessment Patient in pain? yes     Location: neck Intensity: 4 Type: aches Onset of pain  since last visit Nutritional Status BMI of 25 - 29 = overweight Nutritional Status Detail appetite ok CBG Result 142  Have you ever been in a relationship where you felt threatened, hurt or afraid?denies   Does patient need assistance? Functional Status Self care Ambulation Normal Comments FU on neck - sl better.   Primary Care Provider:  Zoila Shutter MD   History of Present Illness: 65 yo woman with PMH of multiple neck surgeries, depression, hypothyroidism, neck pain presents today for follow up.  Patient was seen in office 1 week ago for severe neck pain.  She was prescribed Flexeril 10mg  three times a day x 1 week.  She reports much improvement with her neck pain and can move her neck from side to side now.  She also states that she fell last week while trying to get in to bed because her neck was hurting so much.  Has some bruises on her right elbow and leg.  Mrs. Heart complains of dried solid food getting stuck in her throat whenever she eats.  She has had this problem for a while but notices that it gotten worse in the past week. She can tolerate liquid fine and denies any pain on swallowing.  She also denies hx of alcohol or smoking.  Her depression is improving and has not been crying in the past several days.      Depression History:      The patient denies a depressed mood most of the day and a diminished interest in her usual daily  activities.         Preventive Screening-Counseling & Management  Alcohol-Tobacco     Alcohol drinks/day: 0     Alcohol type: rarely     Smoking Status: never  Caffeine-Diet-Exercise     Does Patient Exercise: yes  Allergies: 1)  ! Prednisone 2)  ! * Pineapple 3)  ! * Strawberries  Review of Systems  The patient denies anorexia, fever, weight loss, weight gain, vision loss, decreased hearing, hoarseness, chest pain, syncope, dyspnea on exertion, peripheral edema, prolonged cough, headaches, hemoptysis, abdominal pain, melena, hematochezia, severe indigestion/heartburn, hematuria, incontinence, genital sores, muscle weakness, suspicious skin lesions, transient blindness, difficulty walking, depression, unusual weight change, abnormal bleeding, enlarged lymph nodes, angioedema, breast masses, and testicular masses.    Physical Exam  General:  alert, well-developed, well-nourished, and well-hydrated.  alert, well-developed, well-nourished, and well-hydrated.   Neck:  supple and no masses. Mild limited range of motion; significant improvement from exam 1 week ago, nontender,   supple and no masses.   Lungs:  normal respiratory effort, no intercostal retractions, no accessory muscle use, normal breath sounds, no crackles, and no wheezes.  normal respiratory effort, no intercostal retractions, no accessory muscle use, normal breath sounds, no crackles, and no wheezes.  Heart:  normal rate, regular rhythm, no murmur, no gallop, no rub, and no JVD.  normal rate, regular rhythm, no murmur, no gallop, no rub, and no JVD.   Abdomen:  soft, non-tender, normal bowel sounds, no distention, no masses, and no rebound tenderness.  soft, non-tender, normal bowel sounds, no distention, no masses, and no rebound tenderness.    Diabetes Management Exam:    Foot Exam (with socks and/or shoes not present):       Sensory-Monofilament:          Left foot: normal          Right foot: normal   Impression  & Recommendations:  Problem # 1:  NECK PAIN, LEFT (ICD-723.1)  Significant improvement.  Will continue 1 more week of Flexeril for her muscle spasm.   Written prescription: Flexeril 10mg  1 to 2 tablets daily as needed for spasm.  Advised her to take it at night because it can cause her to be sleepy.  She will follow up with Dr. Coralee Pesa in 3 weeks.  Her updated medication list for this problem includes:    Aspirin 81 Mg Tbec (Aspirin) .Marland Kitchen... Take 1 tablet by mouth once a day    Tramadol Hcl 50 Mg Tabs (Tramadol hcl) .Marland Kitchen... Take 1-2 tablet by mouth4times a day as needed for pain    Cyclobenzaprine Hcl 10 Mg Tabs (Cyclobenzaprine hcl) .Marland Kitchen... Take 1 to 2 tablets daily as needed for muscle spasm  Her updated medication list for this problem includes:    Aspirin 81 Mg Tbec (Aspirin) .Marland Kitchen... Take 1 tablet by mouth once a day    Tramadol Hcl 50 Mg Tabs (Tramadol hcl) .Marland Kitchen... Take 1-2 tablet by mouth4times a day as needed for pain  Problem # 2:  DEPRESSION (ICD-311) Improvement with medication started by Dr. Coralee Pesa in November.  She reports of not being tearful anymore.   Will continue current medications. Her updated medication list for this problem includes:    Alprazolam 0.5 Mg Tabs (Alprazolam) .Marland Kitchen... 1/2 tablet three times a day as needed    Sertraline Hcl 100 Mg Tabs (Sertraline hcl) .Marland Kitchen... 1 a day  Her updated medication list for this problem includes:    Alprazolam 0.5 Mg Tabs (Alprazolam) .Marland Kitchen... 1/2 tablet three times a day as needed    Sertraline Hcl 100 Mg Tabs (Sertraline hcl) .Marland Kitchen... 1 a day  Problem # 3:  HYPERTENSION (ICD-401.9) Well controlled.  BP 107/61. Continue current meds  Continue current meds Her updated medication list for this problem includes:    Quinapril Hcl 20 Mg Tabs (Quinapril hcl) .Marland Kitchen... Take 1/2 a pill a day    Triamterene-hctz 75-50 Mg Tabs (Triamterene-hctz) .Marland Kitchen... 1/4- 1/2 of a pill one a day as needed  Her updated medication list for this problem includes:     Quinapril Hcl 20 Mg Tabs (Quinapril hcl) .Marland Kitchen... Take 1/2 a pill a day    Triamterene-hctz 75-50 Mg Tabs (Triamterene-hctz) .Marland Kitchen... 1/4- 1/2 of a pill one a day as needed  Problem # 4:  AODM (ICD-250.00) CBG 142 today.  She has a history of borderline DM; however, her HbA1c in 05/27/2010 was 5.7.  She reported just eating breakfast prior to office visit.  We will continue to monitor her CBG and HbA1c.  Her updated medication list for this problem includes:    Aspirin 81 Mg Tbec (Aspirin) .Marland Kitchen... Take 1 tablet by mouth once a day    Quinapril Hcl 20 Mg Tabs (Quinapril hcl) .Marland Kitchen... Take 1/2  a pill a day  Complete Medication List: 1)  Nasonex 50 Mcg/act Susp (Mometasone furoate) .... Use 1 spray in each nostril once a day 2)  Aspirin 81 Mg Tbec (Aspirin) .... Take 1 tablet by mouth once a day 3)  Quinapril Hcl 20 Mg Tabs (Quinapril hcl) .... Take 1/2 a pill a day 4)  Alprazolam 0.5 Mg Tabs (Alprazolam) .... 1/2 tablet three times a day as needed 5)  Pantoprazole Sodium 20 Mg Tbec (Pantoprazole sodium) .... Take 1 tablet by mouth two times a day once before breakfast and the other before dinner 6)  Triamterene-hctz 75-50 Mg Tabs (Triamterene-hctz) .... 1/4- 1/2 of a pill one a day as needed 7)  Tramadol Hcl 50 Mg Tabs (Tramadol hcl) .... Take 1-2 tablet by mouth4times a day as needed for pain 8)  Zostavax 10272 Unt/0.27ml Solr (Zoster vaccine live) .... Inject im x 1 9)  Sertraline Hcl 100 Mg Tabs (Sertraline hcl) .Marland Kitchen.. 1 a day 10)  Cyclobenzaprine Hcl 10 Mg Tabs (Cyclobenzaprine hcl) .... Take 1 to 2 tablets daily as needed for muscle spasm  Other Orders: Capillary Blood Glucose/CBG (53664)  Patient Instructions: 1)  Continue Flexeril 10mg  one to two tablets by mouth daily as needed for muscle spasm x 1 week 2)  Follow up with Dr. Coralee Pesa in 3 weeks. Prescriptions: CYCLOBENZAPRINE HCL 10 MG TABS (CYCLOBENZAPRINE HCL) take 1 to 2 tablets daily as needed for muscle spasm  #60 x 0   Entered and  Authorized by:   Rosana Berger MD   Signed by:   Rosana Berger MD on 07/25/2010   Method used:   Handwritten   RxID:   [id number]    Orders Added: 1)  Capillary Blood Glucose/CBG [43329]     Prevention & Chronic Care Immunizations   Influenza vaccine: Fluvax 3+  (09/11/2009)    Tetanus booster: Not documented   Td booster deferral: Deferred  (09/11/2009)    Pneumococcal vaccine: Not documented    H. zoster vaccine: Not documented  Colorectal Screening   Hemoccult: Not documented    Colonoscopy: Not documented  Other Screening   Pap smear: Not documented   Pap smear action/deferral: Not indicated S/P hysterectomy  (09/11/2009)    Mammogram: Assessment: BIRADS 1.   (03/23/2008)   Mammogram action/deferral: Ordered  (09/11/2009)    DXA bone density scan: Not documented   Smoking status: never  (07/25/2010)  Diabetes Mellitus   HgbA1C: 5.7  (05/27/2010)   Hemoglobin A1C due: 07/08/2007    Eye exam: Not documented    Foot exam: yes  (07/25/2010)   Foot exam action/deferral: Do today   High risk foot: Yes  (07/25/2010)   Foot care education: Done  (09/11/2009)    Urine microalbumin/creatinine ratio: Not documented   Urine microalbumin action/deferral: Deferred  Lipids   Total Cholesterol: 242  (03/09/2008)   LDL: 153  (03/09/2008)   LDL Direct: Not documented   HDL: 63  (03/09/2008)   Triglycerides: 130  (03/09/2008)    SGOT (AST): 16  (05/27/2010)   SGPT (ALT): 14  (05/27/2010)   Alkaline phosphatase: 61  (05/27/2010)   Total bilirubin: 0.6  (05/27/2010)  Hypertension   Last Blood Pressure: 107 / 61  (07/25/2010)   Serum creatinine: 0.97  (05/27/2010)   Serum potassium 3.8  (05/27/2010)  Self-Management Support :   Personal Goals (by the next clinic visit) :     Personal A1C goal: 6  (05/27/2010)     Personal blood pressure goal: 140/90  (  05/27/2010)     Personal LDL goal: 100  (05/27/2010)    Patient will work on the following items until  the next clinic visit to reach self-care goals:     Medications and monitoring: take my medicines every day, bring all of my medications to every visit, weigh myself weekly, examine my feet every day  (07/25/2010)     Eating: drink diet soda or water instead of juice or soda, eat more vegetables, use fresh or frozen vegetables, eat fruit for snacks and desserts, limit or avoid alcohol  (07/25/2010)     Activity: take a 30 minute walk every day  (07/25/2010)    Diabetes self-management support: Written self-care plan, Education handout, Resources for patients handout  (07/25/2010)   Diabetes care plan printed   Diabetes education handout printed   Last diabetes self-management training by diabetes educator: 05/05/2007   Last medical nutrition therapy: 04/13/2007    Hypertension self-management support: Written self-care plan, Education handout, Resources for patients handout  (07/25/2010)   Hypertension self-care plan printed.   Hypertension education handout printed    Lipid self-management support: Written self-care plan, Education handout, Resources for patients handout  (07/25/2010)   Lipid self-care plan printed.   Lipid education handout printed      Resource handout printed.   Last LDL:                                                 153 (03/09/2008 9:12:00 PM)        Diabetic Foot Exam Foot Inspection Is there a history of a foot ulcer?              No Is there a foot ulcer now?              No Can the patient see the bottom of their feet?          Yes Are the shoes appropriate in style and fit?          Yes Is there swelling or an abnormal foot shape?          No Are the toenails long?                No Are the toenails thick?                No Are the toenails ingrown?              No Is there heavy callous build-up?              No Is there a claw toe deformity?                          No Is there elevated skin temperature?            No Is there limited ankle  dorsiflexion?            No Is there foot or ankle muscle weakness?            No Do you have pain in calf while walking?           No       High Risk Feet? Yes   10-g (5.07) Semmes-Weinstein Monofilament Test Performed by: Stanton Kidney Ditzler RN  Right Foot          Left Foot Visual Inspection     normal         normal Test Control      normal         normal Site 1         normal         normal Site 2         normal         normal Site 3         normal         normal Site 4         normal         normal Site 5         normal         normal Site 6         normal         normal Site 7         normal         normal Site 8         normal         normal Site 9         normal         normal Site 10         normal         normal  Impression      normal         normal

## 2010-09-17 NOTE — Progress Notes (Signed)
Summary: Refill/gg  Phone Note Call from Patient   Caller: Patient Summary of Call: Pt called and the pharmacy has not received the Rx for Sertaline  1/2  tablet a day for 6 days, then start taking one a day or for the change for  2 tramadol 4 times a  day.  Can you send these in? Initial call taken by: Merrie Roof RN,  July 10, 2010 12:35 PM    New/Updated Medications: TRAMADOL HCL 50 MG TABS (TRAMADOL HCL) Take 1-2 tablet by mouth4times a day as needed for pain SERTRALINE HCL 100 MG TABS (SERTRALINE HCL) 1 a day  Prescriptions: SERTRALINE HCL 100 MG TABS (SERTRALINE HCL) 1 a day  #31 x 6   Entered and Authorized by:   Zoila Shutter MD   Signed by:   Zoila Shutter MD on 07/10/2010   Method used:   Electronically to        [facility] [phone]* (retail)       [address] [place], [place]  [address]       Ph: [phone] or [phone]       Fax: [phone]   RxID:   [id number] TRAMADOL HCL 50 MG TABS (TRAMADOL HCL) Take 1-2 tablet by mouth4times a day as needed for pain  #120 x 6   Entered and Authorized by:   Zoila Shutter MD   Signed by:   Zoila Shutter MD on 07/10/2010   Method used:   Electronically to        [facility] [phone]* (retail)       [address] [place], [place]  [address]       Ph: [phone] or [phone]       Fax: [phone]   RxID:   [id number]  Pt informed Rx called in Merrie Roof RN  July 10, 2010 2:31 PM

## 2010-09-17 NOTE — Progress Notes (Signed)
Summary: med  dosage change/gp  Phone Note Refill Request Message from:  Fax from Pharmacy on February 11, 2010 3:45 PM  Refills Requested: Medication #1:  MAXZIDE-25 37.5-25 MG TABS Take 1/2 to 1 tablet by mouth once daily Maxzide 37.5/25 mg is currently unavailabe; the pharmacy wants to know if it's  o.k. to switch to 75/50mg  1/4 to 1/2 tablet once daily?   Method Requested: Electronic Initial call taken by: Chinita Pester RN,  February 11, 2010 3:45 PM  Follow-up for Phone Call        Fine to switch to 75/50 one half daily Follow-up by: Zoila Shutter MD,  February 11, 2010 4:15 PM    New/Updated Medications: TRIAMTERENE-HCTZ 75-50 MG TABS (TRIAMTERENE-HCTZ) 1/4- 1/2 of a pill one a day as needed Prescriptions: TRIAMTERENE-HCTZ 75-50 MG TABS (TRIAMTERENE-HCTZ) 1/4- 1/2 of a pill one a day as needed  #30 x 6   Entered and Authorized by:   Zoila Shutter MD   Signed by:   Zoila Shutter MD on 02/11/2010   Method used:   Electronically to        [facility] [phone]* (retail)       [address] [place], [place]  [address]       Ph: [phone] or [phone]       Fax: [phone]   RxID:   [id number]
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
## 2010-09-17 NOTE — Assessment & Plan Note (Signed)
Summary: EST-CK/FU/MEDS/CFB   Vital Signs:  Patient profile:   65 year old female Height:      61 inches (154.94 cm) Weight:      164.2 pounds (74.64 kg) BMI:     31.14 Temp:     98.1 degrees F (36.72 degrees C) oral Pulse rate:   94 / minute BP sitting:   110 / 64  (right arm)  Vitals Entered By: Chinita Pester RN (September 11, 2009 2:13 PM) CC: New to MD. c/o ? reflux/mid chest discomfort. Is Patient Diabetic? Yes Did you bring your meter with you today? No; she does not have a meter Pain Assessment Patient in pain? yes     Location: neck/shoulder Intensity: 2 Type: aching Onset of pain  Chronic Nutritional Status BMI of > 30 = obese CBG Result 147  Have you ever been in a relationship where you felt threatened, hurt or afraid?No   Does patient need assistance? Functional Status Self care Ambulation Normal   Diabetic Foot Exam Last Podiatry Exam Date: 09/11/2009  Foot Inspection Is there a history of a foot ulcer?              No Is there a foot ulcer now?              No Can the patient see the bottom of their feet?          Yes Are the shoes appropriate in style and fit?          Yes Is there swelling or an abnormal foot shape?          No Are the toenails long?                No Are the toenails thick?                No Are the toenails ingrown?              No Is there heavy callous build-up?              No  Diabetic Foot Care Education Patient educated on appropriate care of diabetic feet.  Pulse Check          Right Foot          Left Foot Dorsalis Pedis:        normal            normal  High Risk Feet? No   10-g (5.07) Semmes-Weinstein Monofilament Test Performed by: Chinita Pester RN          Right Foot          Left Foot Visual Inspection     normal         normal Test Control      normal         normal Site 1         normal         normal Site 2         normal         normal Site 3         normal         normal Site 4         normal          normal Site 5         normal         normal Site 6         normal  normal Site 7         normal         normal Site 8         normal         normal Site 9         normal         normal  Impression      normal         normal   CC:  New to MD. c/o ? reflux/mid chest discomfort.Marland Kitchen  History of Present Illness: 65 yr old who comes in to establish with new MD. Pt. has not been in since 7/09. She has not been taking care of herself. She admits she has not been following up like she is supposed to  Shows me a rash on her R breast. Was vesicular and burning.  Has had chest tightness like her indigestion that responded to Protonix in the past. She stopped it some time ago. She has been taking Pepcid without relief. It is worse with rest. She feels a bit SOB with it, but no other symptoms.  Does not check sugars at home. She had lost " a bunch of weight" and her sugars had normalized but now she has not been active and has gained it back.  Her OTC Allertec is still working.  Preventive Screening-Counseling & Management  Alcohol-Tobacco     Alcohol drinks/day: 0     Alcohol type: rarely     Smoking Status: never  Caffeine-Diet-Exercise     Does Patient Exercise: no  Current Medications (verified): 1)  Maxzide-25 37.5-25 Mg Tabs (Triamterene-Hctz) .... Take 1/2 To 1 Tablet By Mouth Once Daily 2)  Nasonex 50 Mcg/act Susp (Mometasone Furoate) .... Use 1 Spray in Each Nostril Once A Day 3)  Aspirin 81 Mg Tbec (Aspirin) .... Take 1 Tablet By Mouth Once A Day 4)  Quinapril Hcl 40 Mg Tabs (Quinapril Hcl) .... Take 1/2 Tablet By Mouth Once Daily 5)  Alprazolam 0.5 Mg  Tabs (Alprazolam) .... 1/2 Tablet Three Times A Day As Needed  Allergies: 1)  ! Prednisone 2)  ! * Pineapple 3)  ! * Strawberries  Social History: Does Patient Exercise:  no  Review of Systems General:  See HPI; Complains of fatigue; denies sweats. CV:  Denies palpitations and shortness of breath with  exertion. Resp:  Denies cough and shortness of breath. GI:  Complains of indigestion; worse with a large meal.  Physical Exam  General:  alert and well-developed.   Neck:  supple and full ROM.   Lungs:  normal breath sounds.   Heart:  normal rate and regular rhythm.   Abdomen:  soft and non-tender.   Msk:  normal ROM and no joint swelling.   Extremities:  no edema Additional Exam:  skin-pt. has healing lesions on L upper breas,t there are no longer any vesicular lesions, now just healing muculo-papular ones  Diabetes Management Exam:    Foot Exam (with socks and/or shoes not present):       Sensory-Monofilament:          Left foot: normal          Right foot: normal   Impression & Recommendations:  Problem # 1:  CHEST PAIN (ICD-786.50) I suspect this is GI in etiology as does patient. She will start back on Protonix and let us know if she is not feeling better in 1-2 weeks. Her EKG has a LBBB so that would not be  helpful. We have also discussed beginning exercise at length. Usually this is worse with a heavy meal and rest.  Problem # 2:  HERPES ZOSTER (ICD-053.9) Pt. was not aware that this is what she had. I asked her to check with her insurance company to see if they would pay for the Zostavax. They should as she is more likely to have recurrence after a first episode. As well if she does have a recurrence have told her to come in right away.  Problem # 3:  AODM (ICD-250.00) Pts. HbA1c is actually fine today. However she is not taking care of herself and plans to start exercising again. Her updated medication list for this problem includes:    Aspirin 81 Mg Tbec (Aspirin) .Marland Kitchen... Take 1 tablet by mouth once a day    Quinapril Hcl 20 Mg Tabs (Quinapril hcl) .Marland Kitchen... Take 1/2 a pill a day  Orders: T- Capillary Blood Glucose (82948) T-Hgb A1C (in-house) (25956LO)  Problem # 4:  HYPERTENSION (ICD-401.9) Pts. BP was actually low normal at times at home and she is symptomatic. I have  decreased dose of Quinapril in half. Her updated medication list for this problem includes:    Maxzide-25 37.5-25 Mg Tabs (Triamterene-hctz) .Marland Kitchen... Take 1/2 to 1 tablet by mouth once daily    Quinapril Hcl 20 Mg Tabs (Quinapril hcl) .Marland Kitchen... Take 1/2 a pill a day  Problem # 5:  HYPERLIPIDEMIA (ICD-272.4) Pt. to return in 6-8 She will come fasting at that time.  Problem # 6:  HEALTH SCREENING (ICD-V70.0) Flu shot given today. Mammogram ordered She is s/p hysterectomy.  Complete Medication List: 1)  Maxzide-25 37.5-25 Mg Tabs (Triamterene-hctz) .... Take 1/2 to 1 tablet by mouth once daily 2)  Nasonex 50 Mcg/act Susp (Mometasone furoate) .... Use 1 spray in each nostril once a day 3)  Aspirin 81 Mg Tbec (Aspirin) .... Take 1 tablet by mouth once a day 4)  Quinapril Hcl 20 Mg Tabs (Quinapril hcl) .... Take 1/2 a pill a day 5)  Alprazolam 0.5 Mg Tabs (Alprazolam) .... 1/2 tablet three times a day as needed 6)  Pantoprazole Sodium 20 Mg Tbec (Pantoprazole sodium) .... One pill a day  Other Orders: Admin 1st Vaccine (75643) Flu Vaccine 28yrs + (32951) Mammogram (Screening) (Mammo)  Patient Instructions: 1)  Please schedule a follow-up appointment in 6-8 weeks. 2)  Start getting some exercise again-YOU CAN DO IT!!! 3)  We restarted the Protonix today. Let me know if the chest discomfort is not a whole lot better in 1-2 weeks. 4)  We went down on your BP medicine Quinapril to 20mg  1/2 a pill a day. 5)  You got a flu shot today. 6)  I'm rooting for you!!! Prescriptions: MAXZIDE-25 37.5-25 MG TABS (TRIAMTERENE-HCTZ) Take 1/2 to 1 tablet by mouth once daily  #30 x 11   Entered and Authorized by:   Zoila Shutter MD   Signed by:   Zoila Shutter MD on 09/11/2009   Method used:   Electronically to        [facility] [phone]* (retail)       [address] [place], [place]  [address]       Ph: [phone] or [phone]       Fax: [phone]   RxID:   [id number] QUINAPRIL  HCL 20 MG TABS (QUINAPRIL HCL) take 1/2 a pill a day  #15 x 11   Entered and  Authorized by:   Zoila Shutter MD   Signed by:   Zoila Shutter MD on 09/11/2009   Method used:   Electronically to        [facility] [phone]* (retail)       [address] [place], [place]  [address]       Ph: [phone] or [phone]       Fax: [phone]   RxID:   [id number] PANTOPRAZOLE SODIUM 20 MG TBEC (PANTOPRAZOLE SODIUM) one pill a day  #31 x 11   Entered and Authorized by:   Zoila Shutter MD   Signed by:   Zoila Shutter MD on 09/11/2009   Method used:   Electronically to        [facility] [phone]* (retail)       [address] [place], [place]  [address]       Ph: [phone] or [phone]       Fax: [phone]   RxID:   [id number]   Prevention & Chronic Care Immunizations   Influenza vaccine: Fluvax 3+  (09/11/2009)    Tetanus booster: Not documented   Td booster deferral: Deferred  (09/11/2009)    Pneumococcal vaccine: Not documented    H. zoster vaccine: Not documented  Colorectal Screening   Hemoccult: Not documented    Colonoscopy: Not documented  Other Screening   Pap smear: Not documented   Pap smear action/deferral: Not indicated S/P hysterectomy  (09/11/2009)    Mammogram: Assessment: BIRADS 1.   (03/23/2008)   Mammogram action/deferral: Ordered  (09/11/2009)    DXA bone density scan: Not documented   Smoking status: never  (09/11/2009)  Diabetes Mellitus   HgbA1C: 5.6  (09/11/2009)   Hemoglobin A1C due: 07/08/2007    Eye exam: Not documented    Foot exam: yes  (09/11/2009)   Foot exam action/deferral: Do today   High risk foot: No  (09/11/2009)   Foot care education: Done  (09/11/2009)    Urine microalbumin/creatinine ratio: Not documented   Urine microalbumin action/deferral: Deferred   Progress toward A1C goal: At goal  Lipids   Total Cholesterol: 242  (03/09/2008)   LDL: 153  (03/09/2008)   LDL Direct:  Not documented   HDL: 63  (03/09/2008)   Triglycerides: 130  (03/09/2008)    SGOT (AST): Not documented   SGPT (ALT): Not documented   Alkaline phosphatase: Not documented   Total bilirubin: Not documented  Hypertension   Last Blood Pressure: 110 / 64  (09/11/2009)   Serum creatinine: 0.95  (03/09/2008)   Serum potassium 3.9  (03/09/2008)  Self-Management Support :    Patient will work on the following items until the next clinic visit to reach self-care goals:     Medications and monitoring: bring all of my medications to every visit, examine my feet every day  (09/11/2009)     Activity: take a 30 minute walk every day  (09/11/2009)    Diabetes self-management support: Written self-care plan  (09/11/2009)   Diabetes care plan printed   Last diabetes self-management training by diabetes educator: 05/05/2007   Last medical nutrition therapy: 04/13/2007    Hypertension self-management support: Written self-care plan  (09/11/2009)   Hypertension self-care plan printed.    Lipid self-management support: Written self-care plan  (09/11/2009)   Lipid self-care plan printed.   Nursing Instructions: Diabetic foot exam today  Give Flu vaccine today Schedule screening mammogram (see order)     Laboratory Results   Blood Tests   Date/Time Received: September 11, 2009 2:35 PM  Date/Time Reported: Burke Keels  September 11, 2009 2:35 PM   HGBA1C: 5.6%   (Normal Range: Non-Diabetic - 3-6%   Control Diabetic - 6-8%) CBG Random:: 147mg /dL    Flu Vaccine Consent Questions     Do you have a history of severe allergic reactions to this vaccine? no    Any prior history of allergic reactions to egg and/or gelatin? no    Do you have a sensitivity to the preservative Thimersol? no    Do you have a past history of Guillan-Barre Syndrome? no    Do you currently have an acute febrile illness? no    Have you ever had a severe reaction to latex? no    Vaccine information given and  explained to patient? yes    Are you currently pregnant? no    Lot [id number]   Exp Date:11/15/2009   Manufacturer: [employer]    Site Given  Left Deltoid IMsflu Chinita Pester RN  September 11, 2009 3:52 PM

## 2010-09-17 NOTE — Miscellaneous (Signed)
Summary: HIPAA Restrictions  HIPAA Restrictions   Imported By: Florinda Marker 09/12/2009 14:16:24  _____________________________________________________________________  External Attachment:    Type:   Image     Comment:   External Document

## 2010-09-17 NOTE — Progress Notes (Signed)
Summary: med refill/gp  Phone Note Refill Request Message from:  Fax from Pharmacy on September 04, 2009 1:47 PM  Refills Requested: Medication #1:  QUINAPRIL HCL 40 MG TABS Take 1/2 tablet by mouth once daily   Last Refilled: 08/07/2009  Method Requested: Electronic Initial call taken by: Chinita Pester RN,  September 04, 2009 1:47 PM    Prescriptions: QUINAPRIL HCL 40 MG TABS (QUINAPRIL HCL) Take 1/2 tablet by mouth once daily  #31 x 11   Entered and Authorized by:   Zoila Shutter MD   Signed by:   Zoila Shutter MD on 09/05/2009   Method used:   Electronically to        [facility] [phone]* (retail)       [address] [place], [place]  [address]       Ph: [phone] or [phone]       Fax: [phone]   RxID:   [id number] QUINAPRIL HCL 40 MG TABS (QUINAPRIL HCL) Take 1/2 tablet by mouth once daily  #31 x 11   Entered and Authorized by:   Zoila Shutter MD   Signed by:   Zoila Shutter MD on 09/05/2009   Method used:   Electronically to        [facility] [phone]* (retail)       [address] [place], [place]  [address]       Ph: [phone] or [phone]       Fax: [phone]   RxID:   [id number]

## 2010-09-17 NOTE — Progress Notes (Signed)
Summary: med change/gp  Phone Note Other Incoming   Caller: [facility] Summary of Call: Prior Authorization is needed for Pantoprazole 20mg . Her insurance will cover brand name which is $135.19 co-pay; so pt. requests less expensive alternative.   Thanks Initial call taken by: Chinita Pester RN,  October 18, 2009 9:20 AM  Follow-up for Phone Call        her insurance will or will not cover brand name? what will they cover that is less expensive? Follow-up by: Zoila Shutter MD,  October 18, 2009 10:45 AM  Additional Follow-up for Phone Call Additional follow up Details #1::        Her insurance will cover brand  name but it will cost the pt. $135.19.  The pharmacist suggested Omeprazole, or Prilosec OTC. Nexium is on the preferred list. Additional Follow-up by: Chinita Pester RN,  October 18, 2009 4:24 PM    New/Updated Medications: OMEPRAZOLE 20 MG TBEC (OMEPRAZOLE) Take 1 tablet by mouth once a day Prescriptions: OMEPRAZOLE 20 MG TBEC (OMEPRAZOLE) Take 1 tablet by mouth once a day  #30 x 11   Entered and Authorized by:   Zoila Shutter MD   Signed by:   Zoila Shutter MD on 10/22/2009   Method used:   Electronically to        [facility] [phone]* (retail)       [address] [place], [place]  [address]       Ph: [phone] or [phone]       Fax: [phone]   RxID:   [id number]

## 2010-09-19 ENCOUNTER — Other Ambulatory Visit: Payer: Self-pay | Admitting: *Deleted

## 2010-09-19 NOTE — Progress Notes (Signed)
Summary: refill/gg  Phone Note Refill Request  on July 26, 2010 3:39 PM  Refills Requested: Medication #1:  PANTOPRAZOLE SODIUM 20 MG TBEC Take 1 tablet by mouth two times a day once before breakfast and the other before dinner   Last Refilled: 06/23/2010  Method Requested: Electronic Initial call taken by: Merrie Roof RN,  July 26, 2010 3:40 PM    Prescriptions: PANTOPRAZOLE SODIUM 20 MG TBEC (PANTOPRAZOLE SODIUM) Take 1 tablet by mouth two times a day once before breakfast and the other before dinner  #60 x 12   Entered and Authorized by:   Zoila Shutter MD   Signed by:   Zoila Shutter MD on 07/26/2010   Method used:   Electronically to        [facility] [phone]* (retail)       [address] [place], [place]  [address]       Ph: [phone] or [phone]       Fax: [phone]   RxID:   [id number]

## 2010-09-19 NOTE — Progress Notes (Signed)
Summary: refill/ hla  Phone Note Refill Request Message from:  Fax from Pharmacy on August 05, 2010 5:27 PM  Refills Requested: Medication #1:  ALPRAZOLAM 0.5 MG  TABS 1/2 tablet three times a day as needed   Dosage confirmed as above?Dosage Confirmed   Last Refilled: 10/10 last visit 12/8  Initial call taken by: Marin Roberts RN,  August 05, 2010 5:27 PM  Follow-up for Phone Call        Rx called to pharmacy Follow-up by: Marin Roberts RN,  August 08, 2010 9:32 AM    Prescriptions: ALPRAZOLAM 0.5 MG  TABS (ALPRAZOLAM) 1/2 tablet three times a day as needed  #45 x 0   Entered and Authorized by:   Zoila Shutter MD   Signed by:   Zoila Shutter MD on 08/06/2010   Method used:   Telephoned to ...       [facility] [phone]* (retail)       [address] [place], [place]  [address]       Ph: [phone] or [phone]       Fax: [phone]   RxID:   [id number]

## 2010-09-19 NOTE — Assessment & Plan Note (Signed)
Summary: EST-6 WEEK CHECKUP/CH   Vital Signs:  Patient profile:   65 year old female Height:      61 inches (154.94 cm) Weight:      153.8 pounds (69.91 kg) BMI:     29.17 Temp:     98.7 degrees F (37.06 degrees C) oral Pulse rate:   103 / minute BP sitting:   122 / 71  (left arm)  Vitals Entered By: Cynda Familia Duncan Dull) (August 07, 2010 2:55 PM) CC: f/u depression,  Is Patient Diabetic? No Pain Assessment Patient in pain? no      Nutritional Status BMI of > 30 = obese  Have you ever been in a relationship where you felt threatened, hurt or afraid?No   Does patient need assistance? Functional Status Self care Ambulation Normal   Primary Care Provider:  Zoila Shutter MD  CC:  f/u depression and .  History of Present Illness:  65 yr old who comes in for follow up and new concerns.   1. Epigastric pain: This brought her in 10/11. Her omeprazole was changed to two times a day and she was doing better. However now that she is on protonix two times a day it is completely resolved. The last time she was in she c/o some trouble with swallowing dry food and that has resolved.  2. Depression: Since I last saw Mrs. Elliott 1/11, she has had MANY stressors. Her son Thayer Ohm suffered a CVA, was hospitalized, suffered another CVA, had a trach and a feeding tube placed, was sent to a facility, pulled the trach out, had a janitor put it back in, was rehospitalized, had pneumonia, was sent to palliative care, decision was made to d/c trach and feeding tube, he lived, and now he is in a rehab facility where he says 3 words and is undergoing slow rehab. She walks him in a wheel-chair 1 1/2-2 hours a day. During that time her husban lost his job, and got a new one, they lost 2 pets of 12 and 20 years, they were forced to refinance their house, etc.    Since starting the sertaline she is no longer finding herself crying. She still feels overwhelmed, but thinks the holiday season is  contributing to this. She is using the xanax that was prescibed several years ago a little more frequently-to fall asleep at night.  3. Neck strain/spasm: Mrs. Jarnagin was seen twice in the last few weeks for this. It has completely resolved with the flexeril. She needs a note saying that she can return to work.  4. HTN: Stable.    Depression History:      The patient is having a depressed mood most of the day but denies diminished interest in her usual daily activities.         Preventive Screening-Counseling & Management  Alcohol-Tobacco     Alcohol drinks/day: 0     Alcohol type: rarely     Smoking Status: never  Current Medications (verified): 1)  Nasonex 50 Mcg/act Susp (Mometasone Furoate) .... Use 1 Spray in Each Nostril Once A Day 2)  Aspirin 81 Mg Tbec (Aspirin) .... Take 1 Tablet By Mouth Once A Day 3)  Quinapril Hcl 20 Mg Tabs (Quinapril Hcl) .... Take 1/2 A Pill A Day 4)  Alprazolam 0.5 Mg  Tabs (Alprazolam) .... 1/2 Tablet Three Times A Day As Needed 5)  Pantoprazole Sodium 20 Mg Tbec (Pantoprazole Sodium) .... Take 1 Tablet By Mouth Two Times A Day Once  Before Breakfast and The Other Before Dinner 6)  Triamterene-Hctz 75-50 Mg Tabs (Triamterene-Hctz) .... 1/4- 1/2 of A Pill One A Day As Needed 7)  Tramadol Hcl 50 Mg Tabs (Tramadol Hcl) .... Take 1-2 Tablet By Mouth4times A Day As Needed For Pain 8)  Zostavax 01027 Unt/0.17ml Solr (Zoster Vaccine Live) .... Inject Im X 1 9)  Sertraline Hcl 100 Mg Tabs (Sertraline Hcl) .Marland Kitchen.. 1 A Day 10)  Cyclobenzaprine Hcl 10 Mg Tabs (Cyclobenzaprine Hcl) .... Take 1 To 2 Tablets Daily As Needed For Muscle Spasm  Allergies: 1)  ! Prednisone 2)  ! * Pineapple 3)  ! * Strawberries  Past History:  Past Medical History: Reviewed history from 09/16/2006 and no changes required. 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 Hysterectomy  Social History: Reviewed history from 07/09/2010 and no changes required. married  son Thayer Ohm suffered CVA x2, trach, now in rehab facility-2011 Regular exercise-yes  Review of Systems General:  Denies chills, fatigue, fever, sleep disorder, and weakness. CV:  Denies chest pain or discomfort and palpitations. Resp:  Denies cough and shortness of breath. GI:  Denies abdominal pain, nausea, and vomiting. Psych:  Complains of anxiety and depression.  Physical Exam  General:  alert.   Head:  normocephalic and atraumatic.   Eyes:  vision grossly intact.   Neck:  supple and no masses.   Lungs:  normal respiratory effort and normal breath sounds.   Heart:  normal rate, regular rhythm, and no murmur.   Abdomen:  soft, non-tender, and normal bowel sounds.   Extremities:  no edema Psych:  Oriented X3 and not depressed appearing.  not tearful   Impression & Recommendations:  Problem # 1:  DEPRESSION (ICD-311) She is definitely improved. Will continue present medication and have her followup in 2-3 months or sooner if needed. Her updated medication list for this problem includes:    Alprazolam 0.5 Mg Tabs (Alprazolam) .Marland Kitchen... 1/2 tablet three times a day as needed    Sertraline Hcl 100 Mg Tabs (Sertraline hcl) .Marland Kitchen... 1 a day  Problem # 2:  NECK PAIN, LEFT (ICD-723.1) Assessment: Improved This has resolved with treatment. Her updated medication list for this problem includes:    Aspirin 81 Mg Tbec (Aspirin) .Marland Kitchen... Take 1 tablet by mouth once a day    Tramadol Hcl 50 Mg Tabs (Tramadol hcl) .Marland Kitchen... Take 1-2 tablet by mouth4times a day as needed for pain     Cyclobenzaprine Hcl 10 Mg Tabs (Cyclobenzaprine hcl) .Marland Kitchen... Take 1 to 2 tablets daily as needed for muscle spasm  Problem # 3:  HYPERTENSION (ICD-401.9) Well controlled, continue current regimen. Her updated medication list for this problem includes:    Quinapril Hcl 20 Mg Tabs (Quinapril hcl) .Marland Kitchen... Take 1/2 a pill a day    Triamterene-hctz 75-50 Mg Tabs (Triamterene-hctz) .Marland Kitchen... 1/4- 1/2 of a pill one a day as needed  Problem # 4:  AODM (ICD-250.00) Stable, diet controlled. Her updated medication list for this problem includes:    Aspirin 81 Mg Tbec (Aspirin) .Marland Kitchen... Take 1 tablet by mouth once a day    Quinapril Hcl 20 Mg Tabs (Quinapril hcl) .Marland Kitchen... Take 1/2 a pill a day  Problem # 5:  GERD (ICD-530.81)  Stable, is currently requiring two times a day proton pump inhibitor. Her updated medication list for this problem includes:    Pantoprazole Sodium 20 Mg Tbec (Pantoprazole sodium) .Marland Kitchen... Take 1 tablet by mouth two times a day once before breakfast and the other before dinner  Complete Medication List: 1)  Nasonex 50 Mcg/act Susp (Mometasone furoate) .... Use 1 spray in each nostril once a day 2)  Aspirin 81 Mg Tbec (Aspirin) .... Take 1 tablet by mouth once a day 3)  Quinapril Hcl 20 Mg Tabs (Quinapril hcl) .... Take 1/2 a pill a day 4)  Alprazolam 0.5 Mg Tabs (Alprazolam) .... 1/2 tablet three times a day as needed 5)  Pantoprazole Sodium 20 Mg Tbec (Pantoprazole sodium) .... Take 1 tablet by mouth two times a day once before breakfast and the other before dinner 6)  Triamterene-hctz 75-50 Mg Tabs (Triamterene-hctz) .... 1/4- 1/2 of a pill one a day as needed 7)  Tramadol Hcl 50 Mg Tabs (Tramadol hcl) .... Take 1-2 tablet by mouth4times a day as needed for pain 8)  Zostavax 47829 Unt/0.73ml Solr (Zoster vaccine live) .... Inject im x 1 9)  Sertraline Hcl 100 Mg Tabs (Sertraline hcl) .Marland Kitchen.. 1 a day 10)  Cyclobenzaprine Hcl 10 Mg Tabs (Cyclobenzaprine hcl) .... Take 1 to 2 tablets daily as  needed for muscle spasm  Patient Instructions: 1)  Please schedule a follow-up appointment in 3 months. 2)  I wish you wonderful holidays!!!   Orders Added: 1)  Est. Patient Level IV [56213]

## 2010-09-19 NOTE — Progress Notes (Signed)
Summary: Refill/gh  Phone Note Refill Request Message from:  Fax from Pharmacy on September 09, 2010 11:02 AM  Refills Requested: Medication #1:  ALPRAZOLAM 0.5 MG  TABS 1/2 tablet three times a day as needed   Last Refilled: 08/08/2010  Method Requested: Fax to Local Pharmacy Initial call taken by: Angelina Ok RN,  September 09, 2010 11:02 AM  Follow-up for Phone Call        Rx called to pharmacy Follow-up by: Angelina Ok RN,  September 09, 2010 3:52 PM    Prescriptions: ALPRAZOLAM 0.5 MG  TABS (ALPRAZOLAM) 1/2 tablet three times a day as needed  #45 x 2   Entered and Authorized by:   Zoila Shutter MD   Signed by:   Zoila Shutter MD on 09/09/2010   Method used:   Telephoned to ...       [facility] [phone]* (retail)       [address] [place], [place]  [address]       Ph: [phone] or [phone]       Fax: [phone]   RxID:   [id number]

## 2010-09-25 NOTE — Progress Notes (Signed)
Summary: Refill/gh  Phone Note Refill Request Message from:  Fax from Pharmacy on September 16, 2010 9:19 AM  Refills Requested: Medication #1:  QUINAPRIL HCL 20 MG TABS take 1/2 a pill a day  Medication #2:  ALPRAZOLAM 0.5 MG  TABS 1/2 tablet three times a day as needed  Medication #3:  NASONEX 50 MCG/ACT SUSP Use 1 spray in each nostril once a day Call to pharmacy.  Pt has never gotten the Quinapril 20 mg tablets to take 1/2 tablet.  Pt has always gotten the 40 mg and ordered to take i/2 tablet of that.  Pharmacy never received the prescription for the 20 mg tablets to take 1/2 tablet of the Quinapril 20 mg tablets.  Call to pt unable to speak with pt.  Did not leave a message on the answering machine.   Method Requested: Electronic Initial call taken by: Angelina Ok RN,  September 16, 2010 9:19 AM  Follow-up for Phone Call        This refill was done in 09/11/09 when her BP was lower. This is the dose she should be on. Aprazolam was just filled a couple of days ago. Follow-up by: Zoila Shutter MD,  September 16, 2010 4:15 PM    Prescriptions: QUINAPRIL HCL 20 MG TABS (QUINAPRIL HCL) take 1/2 a pill a day  #15 x 11   Entered and Authorized by:   Zoila Shutter MD   Signed by:   Zoila Shutter MD on 09/16/2010   Method used:   Electronically to        [facility] [phone]* (retail)       [address] [place], [place]  [address]       Ph: [phone] or [phone]       Fax: [phone]   RxID:   [id number] NASONEX 50 MCG/ACT SUSP (MOMETASONE FUROATE) Use 1 spray in each nostril once a day  #1 x 12   Entered and Authorized by:   Zoila Shutter MD   Signed by:   Zoila Shutter MD on 09/16/2010   Method used:   Electronically to        [facility] [phone]* (retail)       [address] [place], [place]  [address]       Ph: [phone] or [phone]       Fax: [phone]   RxID:   [id number]

## 2010-10-28 LAB — GLUCOSE, CAPILLARY: Glucose-Capillary: 142 mg/dL — ABNORMAL HIGH (ref 70–99)

## 2010-10-31 ENCOUNTER — Encounter: Payer: Self-pay | Admitting: Internal Medicine

## 2010-10-31 LAB — GLUCOSE, CAPILLARY: Glucose-Capillary: 120 mg/dL — ABNORMAL HIGH (ref 70–99)

## 2010-11-18 ENCOUNTER — Encounter: Payer: Self-pay | Admitting: Internal Medicine

## 2010-11-18 DIAGNOSIS — E785 Hyperlipidemia, unspecified: Secondary | ICD-10-CM

## 2010-11-18 DIAGNOSIS — E119 Type 2 diabetes mellitus without complications: Secondary | ICD-10-CM

## 2010-11-18 DIAGNOSIS — K219 Gastro-esophageal reflux disease without esophagitis: Secondary | ICD-10-CM

## 2010-11-19 ENCOUNTER — Ambulatory Visit (INDEPENDENT_AMBULATORY_CARE_PROVIDER_SITE_OTHER): Payer: BC Managed Care – PPO | Admitting: Internal Medicine

## 2010-11-19 ENCOUNTER — Encounter: Payer: Self-pay | Admitting: Internal Medicine

## 2010-11-19 VITALS — BP 119/69 | HR 88 | Temp 98.0°F | Ht 61.0 in | Wt 153.5 lb

## 2010-11-19 DIAGNOSIS — I10 Essential (primary) hypertension: Secondary | ICD-10-CM

## 2010-11-19 DIAGNOSIS — F329 Major depressive disorder, single episode, unspecified: Secondary | ICD-10-CM

## 2010-11-19 DIAGNOSIS — R7309 Other abnormal glucose: Secondary | ICD-10-CM

## 2010-11-19 DIAGNOSIS — R739 Hyperglycemia, unspecified: Secondary | ICD-10-CM

## 2010-11-19 DIAGNOSIS — E785 Hyperlipidemia, unspecified: Secondary | ICD-10-CM

## 2010-11-19 DIAGNOSIS — K219 Gastro-esophageal reflux disease without esophagitis: Secondary | ICD-10-CM

## 2010-11-19 LAB — COMPREHENSIVE METABOLIC PANEL
Alkaline Phosphatase: 63 U/L (ref 39–117)
BUN: 14 mg/dL (ref 6–23)
Creat: 1.01 mg/dL (ref 0.40–1.20)
Glucose, Bld: 104 mg/dL — ABNORMAL HIGH (ref 70–99)
Total Bilirubin: 0.6 mg/dL (ref 0.3–1.2)

## 2010-11-19 LAB — LIPID PANEL
HDL: 59 mg/dL (ref >39)
LDL Cholesterol: 144 mg/dL — ABNORMAL HIGH (ref 0–99)
Total CHOL/HDL Ratio: 3.7 Ratio
Triglycerides: 80 mg/dL (ref <150)
VLDL: 16 mg/dL (ref 0–40)

## 2010-11-19 MED ORDER — CYCLOBENZAPRINE HCL 10 MG PO TABS: 10.0000 mg | ORAL_TABLET | Freq: Every evening | ORAL | Status: DC | PRN

## 2010-11-19 MED ORDER — ALPRAZOLAM 0.5 MG PO TABS: 0.2500 mg | ORAL_TABLET | Freq: Every evening | ORAL | Status: DC | PRN

## 2010-11-19 NOTE — Assessment & Plan Note (Signed)
She has a history of hyperlipidemia will check fasting lipid panel today.

## 2010-11-19 NOTE — Assessment & Plan Note (Signed)
Greater than 45 minutes spent with patient number for greater than 50% spent with counseling discussing the importance of patient signed to take care of herself and spending less time at the rehabilitation facility. Patient agrees as does her husband who I saw today but it has started to affect her health-both her emotional and physical health and she spends so much time at the facility and is not taking time to take care of herself. I've asked her to consider at least starting taking one day week off to take care of herself and take time for herself. She may also consider going back to work. For now will continue the sertraline daily and Xanax or Flexeril at night for sleep. I will see her back in 6-8 weeks for followup.

## 2010-11-19 NOTE — Assessment & Plan Note (Signed)
In the past setting patient was wrongly diagnosed with adult onset diabetes mellitus. In fact I don't think that she has had 2 fasting sugars above 122. Her hemoglobin A1c today is 5.6. I have told her that I don't think that she has diabetes and that she can just continue as she can with good diet but strongly recommend regular aerobic exercise.

## 2010-11-19 NOTE — Progress Notes (Signed)
  Subjective:    Patient ID: Christina Stevenson, female    DOB: Aug 29, 1945, 65 y.o.   MRN: [id number]  HPI Christina Stevenson comes in for 3 month followup. She is doing better since I put her on sertraline. However she is going daily to the rehabilitation facility where her son Thayer Ohm lives. She spends most of her day there because she feels responsible to make sure that he gets fed and that he doesn't have time to himself just sitting out in the facility. As she tells me about it I can tell that it is a huge burden for her. 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 She is sleeping at night taking either a half to one Xanax at night or Flexeril at night. Patient says that she does eat over eat and she notes that's related to stress. She is not getting any regular aerobic exercise but she does take her son Christina Stevenson and pushes him in his wheelchair every day. She actually had an opportunity to get the beach for a week but did not do so because of her need to take care of her son. We talked at length today about the fact that he is in a rehabilitation facility and they take care of him there may actually make him do more for himself and she does. He did suffer 2 strokes and is unable really to talk except for occasional words and phrases.  As far as health maintenance patient had a mammogram last summer. She is due for Pap smear. She had colonoscopy 3 years ago. Patient's blood pressure is well controlled today we have gone down on her blood pressure medicine. She is fasting today so that we can check lipid panel and cmet. Patient's reflexes usually controlled with Protonix twice a day.      Review of Systems patient denies chest pain shortness of breath, nausea vomiting, diarrhea, GU symptoms. Before starting the sertraline patient was crying a lot she is not doing that. She does have some fatigue and obviously worries a lot about her son     Objective:   Physical Exam    Constitutional: She appears well-developed. No distress.  HENT:  Head: Normocephalic and atraumatic.  Cardiovascular: Normal rate, regular rhythm and normal heart sounds.   No murmur heard. Pulmonary/Chest: Breath sounds normal.  Abdominal: Soft. Bowel sounds are normal. There is no tenderness.  Musculoskeletal: Normal range of motion. She exhibits no edema.  Skin: Skin is warm and dry.  Psychiatric: She has a normal mood and affect.          Assessment & Plan:

## 2010-11-19 NOTE — Assessment & Plan Note (Signed)
Christina Stevenson wonders what she can take when she occasionally needs something else in addition to the protonic twice a day. I've told her that it's fine to supplement with TUMS.

## 2010-11-19 NOTE — Patient Instructions (Signed)
Please start taking time to take care of yourself!  I will see you back in 6-8 weeks. We will let you know when your labs return.

## 2010-11-19 NOTE — Assessment & Plan Note (Signed)
Blood pressure is well controlled on lower dose of current medications we'll continue.

## 2010-11-28 NOTE — Progress Notes (Signed)
Message left on recorder for pt to contact me regarding lab results.

## 2010-12-09 ENCOUNTER — Encounter: Payer: Self-pay | Admitting: *Deleted

## 2011-01-03 NOTE — Op Note (Signed)
[name], Christina Stevenson              ACCOUNT NO.:  [id number]   MEDICAL RECORD NO.:  [id number]          PATIENT TYPE:  INP   LOCATION:  3108                         FACILITY:  [facility]   PHYSICIAN:  Hilda Lias, M.D.   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   PROCEDURE:  Anterior 4-5, 5-6, 6-7 diskectomy. Decompression to spinal  column, foraminotomy, interbody fusion with allograft plate from C4 to C7  microscopy.   SURGEON:  Hilda Lias, M.D.   ASSISTANT:  Coletta Memos, M.D.   CLINICAL HISTORY:  The patient was admitted because of neck pain __________.  She had __________ biceps, deltoid, and triceps x-rays showed stenosis and  spondylosis. Surgery was advised. The risks were explained on history and  physical.   PROCEDURE:  The patient was taken to the OR and the neck was prepped. Then a  longitudinal incision through the skin, subplatysmal, down to cervical spine  was done. X-ray showed __________ L4-L5. The patient had a large osteophyte  anteriorly which was removed. With the microscope we opened the anterior  ligament at 4-5, 5-6, 6-7 and total gross diskectomy were removed and  degenerative disk was achieved. With the drill we drilled the posterior part  of the 4-5 area. The posterior ligament was calcified. The incision was made  and we were able to decompress the spinal cord and proceed laterally and  accomplish foraminotomy. The same procedure was done at the level of 4-5, 5-  6, and 6-7 with the same result. At the end we had good decompression at  those 3 levels. With the drill we drilled the end plate and 3 pieces of  allograft, with autograft inside about a centimeter high  were inserted. This  was followed by a plate using 8 screws. Lateral C spine showed good position  of the graft. The area was irrigated. Hemostasis was done with bipolar.  Before moving on we waited 5 minutes just to be sure that we had plenty of  hemostasis. Having achieved this, the area was closed with Vicryl and a  Steri-Strip.           ______________________________  Hilda Lias, M.D.     EB/MEDQ  D:  08/29/2005  T:  08/31/2005  Job:  [id number]

## 2011-01-03 NOTE — Op Note (Signed)
Christina Stevenson, Christina Stevenson              ACCOUNT NO.:  [id number]   MEDICAL RECORD NO.:  [id number]          PATIENT TYPE:  INP   LOCATION:  3108                         FACILITY:  [facility]   PHYSICIAN:  Hilda Lias, M.D.   DATE OF BIRTH:  10-19-1945   DATE OF PROCEDURE:  08/29/2005  DATE OF DISCHARGE:                                 OPERATIVE REPORT   PREOPERATIVE DIAGNOSIS:  Anterior cervical discectomy hematoma.   POSTOPERATIVE DIAGNOSIS:  Anterior cervical discectomy hematoma.   PROCEDURE:  Evacuation of cervical hematoma.   SURGEON:  Hilda Lias, M.D.   CLINICAL HISTORY:  The patient underwent a __________  anterior cervical  fusion a few hours ago.  The patient was in the PACU and she was stable.  Nevertheless, she developed some swelling of the neck.  The patient had no  difficulty breathing, but the swelling continued to get worse.  Because of  that, Dr. Franky Macho decided to take the patient to surgery.   DESCRIPTION OF PROCEDURE:  The patient was taken to the OR.  She was  intubated.  The neck was prepped with Betadine.  The previous stitches were  removed.  Indeed, there was quite a bit of clot in the paravertebral space.  Evacuation with the __________  clot was achieved without any problems.  There was no true evidence of any bleeding whatsoever.  The area was  irrigated with several liters of saline solution.  We waited several minutes  just to be sure that there was no evidence of any bleeding.  There was no  point of bleeding whatsoever.  Having done this, the large Hemovac was  placed in the paravertebral space and the wound was closed with Vicryl.  The  patient is going back to the intensive care unit and she is going to be kept  intubated overnight.  After the procedure, I was able to talk to the family  at length about the hematoma, that most likely, it was secondary to her  chronic intake of aspirin.           ______________________________  Hilda Lias,  M.D.     EB/MEDQ  D:  08/29/2005  T:  08/31/2005  Job:  [id number]   cc:   Coletta Memos, M.D.  Fax: [phone]

## 2011-01-03 NOTE — Discharge Summary (Signed)
Christina Stevenson, Christina Stevenson              ACCOUNT NO.:  [id number]   MEDICAL RECORD NO.:  [id number]          PATIENT TYPE:  INP   LOCATION:  3015                         FACILITY:  [facility]   PHYSICIAN:  Hilda Lias, M.D.   DATE OF BIRTH:  11-Jun-1946   DATE OF ADMISSION:  08/29/2005  DATE OF DISCHARGE:  09/02/2005                                 DISCHARGE SUMMARY   ADMISSION DIAGNOSIS:  C4-5, 5-6, 6-7 spondylosis with a chronic  radiculopathy.   FINAL DIAGNOSES:  1.  C4-5, 5-6, 6-7 spondylosis with a chronic radiculopathy.  2.  Pre-cervical hematoma post surgery.   CLINICAL HISTORY:  The patient was admitted because of neck pain with  radiation to both upper extremity.  X-ray showed cervical spondylosis at the  C4-5, 5-6, 6-7.  Surgery was advised.   LABORATORY:  Normal.   COURSE IN THE HOSPITAL:  The patient was taken to surgery and three-level  discectomy was done.  Before we closed, we waited about five minutes just to  be sure there was plenty of hemostasis.  Having achieved that, the area was  closed.  The patient was sent to PACU and she remained there.  There was  question about use of Morphine.  Nevertheless, she developed some swelling  of the left side of the neck, and I was called by Dr. Franky Macho and because of  the swelling, we decided to take her back to surgery with some clots around  the area of the surgery were removed.  After that, the patient was sent to  the Intensive Care Unit.  A drain was left for at least 48 hours.  Today she  is doing fine, and she is eating.  She has no problems whatsoever.  The  wound looks fine and she is ready to go home.   CONDITION ON DISCHARGE:  Improvement.   MEDICATIONS:  Percocet and Diazepam.  She is going to take all of her other  medications except aspirin that she is not going to be taking for at least a  week.   DIET:  Regular.   ACTIVITY:  Not to drive for at least five days.   FOLLOW UP:  I will see her in my office in  three weeks, or before as needed.           ______________________________  Hilda Lias, M.D.     EB/MEDQ  D:  09/02/2005  T:  09/02/2005  Job:  [id number]

## 2011-01-03 NOTE — Consult Note (Signed)
[name], [name]              ACCOUNT NO.:  [id number]   MEDICAL RECORD NO.:  [id number]          PATIENT TYPE:  INP   LOCATION:  3108                         FACILITY:  [facility]   PHYSICIAN:  Coletta Memos, M.D.     DATE OF BIRTH:  1946-02-22   DATE OF CONSULTATION:  08/29/2005  DATE OF DISCHARGE:                                   CONSULTATION   TIME:  2230.   Christina Stevenson is a patient who underwent a three level anterior cervical  decompression today with subsequent arthrodesis with rebone plugs and  anterior plating.  She had previously been having some problems with her  oxygenation having only obtained an oxygen saturation of 93% of 2 liters of  O2.  An ABG showed that her pCO2 was 71.  It was later found out that the  recovery room nurses that the patient's family have been pushing the button  on her pain control anesthesia unit causing her to be sonorous and to lose  her respiratory drive.  Secondarily to that she was given Narcan and  proceeded to wake up to some degree.  She subsequently ate and was able to  swallow and was doing relatively well though she remained tachycardic at  approximately 130.  She was catheterized for approximately 600 cc of urine  output.  She received approximately 1 liter of fluid in the operating room  and had her fluids postoperatively going at a rate of 100 cc per hour. I was  then later called with regards to her having some difficulty breathing.  The  patient stated that it was hard for her to breath and hard for her to  swallow.  She had not stated this earlier in the evening.  I came in,  removed the dressing and saw what was a slightly edematous area around the  incision.  She certainly was using her accessory muscle at times and sounded  also at times quite stridorous.   Neurologic exam showed her to have normal strength in the upper and lower  extremities.  She was alert and oriented x 4 and answered all questions  appropriately.  Voice was somewhat hoarse.  Blood pressure 130/80's.  Pulse  of 95.  Respiratory rate in the mid teens.   I explained to both [name] and her family why it is that I believe she  needs to go back to the operating room for exploration of the cervical  wounds.  At this point and time she is not an extremist or anywhere close to  it and I would rather do a controlled exploration now.  Secondary to the use  of accessory muscles and a stridor, I am at this time nervous with regards  to her respiratory status and would prefer to take a pre-emptive action as  opposed to a reactive one.  She is therefore going to be taken to the  operating room for a wound exploration and placement probably of a drain.           ______________________________  Coletta Memos, M.D.     KC/MEDQ  D:  08/29/2005  T:  08/31/2005  Job:  [id number]

## 2011-01-14 ENCOUNTER — Ambulatory Visit (INDEPENDENT_AMBULATORY_CARE_PROVIDER_SITE_OTHER): Payer: BC Managed Care – PPO | Admitting: Internal Medicine

## 2011-01-14 ENCOUNTER — Encounter: Payer: Self-pay | Admitting: Internal Medicine

## 2011-01-14 VITALS — BP 120/70 | HR 93 | Temp 98.3°F | Wt 157.5 lb

## 2011-01-14 DIAGNOSIS — K219 Gastro-esophageal reflux disease without esophagitis: Secondary | ICD-10-CM

## 2011-01-14 DIAGNOSIS — I454 Nonspecific intraventricular block: Secondary | ICD-10-CM

## 2011-01-14 DIAGNOSIS — I10 Essential (primary) hypertension: Secondary | ICD-10-CM

## 2011-01-14 DIAGNOSIS — Z0000 Encounter for general adult medical examination without abnormal findings: Secondary | ICD-10-CM | POA: Insufficient documentation

## 2011-01-14 DIAGNOSIS — F329 Major depressive disorder, single episode, unspecified: Secondary | ICD-10-CM

## 2011-01-14 MED ORDER — BUPROPION HCL ER (XL) 150 MG PO TB24: 150.0000 mg | ORAL_TABLET | Freq: Every day | ORAL | Status: DC

## 2011-01-14 NOTE — Assessment & Plan Note (Signed)
Per discussion of this today patient does not have any ischemia associated with this.

## 2011-01-14 NOTE — Assessment & Plan Note (Signed)
[name] has only partially treated depression. Obviously her situation is not going to change and understandably she feels that she needs to be there for her son. At this point I'm going to go ahead and add Wellbutrin 150 mg XL to her sertraline 100 mg a day. Risks benefits and side effects explained. I've explained to her that the 2 of these medications off and work together. I have also told her that I do not think that depression needs to be a way of life for her. I plan to see her back in one month for followup.

## 2011-01-14 NOTE — Assessment & Plan Note (Signed)
We'll schedule mammogram today. We have talked about doing pap smear today but we'll wait to patient's feeling better and schedule in the next couple of months.

## 2011-01-14 NOTE — Patient Instructions (Signed)
I will see you back in 1 month. Stop taking aspirin. I am starting you on Wellbutrin (buproprion) 150mg  XL. Take one a day in the morning. Take good care of yourself.

## 2011-01-14 NOTE — Progress Notes (Signed)
  Subjective:    Patient ID: Christina Stevenson, female    DOB: 12/07/1945, 65 y.o.   MRN: [id number]  HPI Christina Stevenson comes in for followup of depression. She is tearful and says that "depression is just the way of life for her." She is skipping one day a week and not going to see her son at the rehabilitation facility he lives every single day but still is spending almost all of her time here and understandably this continues to get her quite down.  When she first started on the sertraline that did help her to not cry all the time. She does sleep because of the Xanax at night but still feels down a lot of the time. She does not cry "except for when she talks about her son." She doesn't feel like doing anything "I don't even go shopping and I used to love to do that." She denies suicidal ideation.  Christina Stevenson does still complain of dyspeptic symptoms he denies she is on Protonix twice a day. She wonders if she can take another pill a day. She is also sometimes taking TUMS. She does take an aspirin once a day though she only has hypertension and a left bundle branch block. She has had a negative evaluation for ischemia.   Review of Systems  Constitutional: Negative for fatigue.  Respiratory: Negative for shortness of breath.   Cardiovascular: Negative for chest pain.  Gastrointestinal: Negative for nausea, vomiting and abdominal pain.       Objective:   Physical Exam  Cardiovascular: Normal rate and regular rhythm.   Pulmonary/Chest: Breath sounds normal.  Abdominal: Bowel sounds are normal.  Musculoskeletal: She exhibits no edema.   tearful female otherwise in no acute distress.        Assessment & Plan:

## 2011-01-14 NOTE — Assessment & Plan Note (Signed)
I have told Christina Stevenson that she is on the maximum dose of proton pump inhibitor a day. At this time have decided to stop the aspirin as I think her risks outweigh her benefits and she only has hypertension and a left bundle branch block but no evidence of ischemia or diabetes. If when she returns in a month she is still having problems will add Carafate each bedtime. I would rather not do this at this point however because this would get in the way of her Xanax absorption.

## 2011-01-14 NOTE — Assessment & Plan Note (Signed)
Blood pressure is well controlled continue current therapy 

## 2011-02-11 ENCOUNTER — Ambulatory Visit (INDEPENDENT_AMBULATORY_CARE_PROVIDER_SITE_OTHER): Payer: BC Managed Care – PPO | Admitting: Internal Medicine

## 2011-02-11 ENCOUNTER — Encounter: Payer: Self-pay | Admitting: Internal Medicine

## 2011-02-11 VITALS — BP 131/68 | HR 97 | Temp 98.6°F | Wt 156.6 lb

## 2011-02-11 DIAGNOSIS — F3289 Other specified depressive episodes: Secondary | ICD-10-CM

## 2011-02-11 DIAGNOSIS — F329 Major depressive disorder, single episode, unspecified: Secondary | ICD-10-CM

## 2011-02-11 DIAGNOSIS — R1310 Dysphagia, unspecified: Secondary | ICD-10-CM

## 2011-02-11 DIAGNOSIS — Z0000 Encounter for general adult medical examination without abnormal findings: Secondary | ICD-10-CM

## 2011-02-11 DIAGNOSIS — K219 Gastro-esophageal reflux disease without esophagitis: Secondary | ICD-10-CM

## 2011-02-11 DIAGNOSIS — I10 Essential (primary) hypertension: Secondary | ICD-10-CM

## 2011-02-11 NOTE — Assessment & Plan Note (Signed)
At this time we'll continue sertraline and Wellbutrin for at least another 6-8 weeks. When patient first came in she said that the Wellbutrin was doing nothing. However she definitely looks better today and told her that I didn't want to consider it at failure after just 4 weeks. Patient's son is doing better and it could just be is doing better however she agrees to continue this combination for another 6-8 weeks. She will followup in 2-3 months. Again have talked her length about self care and continuing walking.

## 2011-02-11 NOTE — Assessment & Plan Note (Signed)
Will refer patient to GI for further regulation. Because of patient's continued reflux despite proton pump inhibitor twice a day as well did not want to just do a swallowing study.

## 2011-02-11 NOTE — Progress Notes (Signed)
  Subjective:    Patient ID: Christina Stevenson, female    DOB: 1946-01-25, 65 y.o.   MRN: [id number]  HPI [name] comes in today for one-month followup depression. She also has a couple of other concerns. Patient was started on Wellbutrin in addition to the sertraline 1 month ago. She does feel a lot different however this is the first day that she didn't cry in my office and she notes that herself. Her son is doing better and she thinks that may be why she is feeling better.  Patient does have some hip pain. It's her left hip. She describes it in the left lateral area into the gluteal area. She says that she gets better when she gets up and moves around. She has been walking and says that it does not hurt when she is walking. It does hurt first thing in the morning. She does take tramadol few times a week for this pain.  Patient also describes dysphagia to food at times and carbonated drinks. She also continues to complain of heartburn and reflux despite Protonix twice a day.    Review of Systems positive as per history of present illness. Negative for nausea vomiting, diarrhea, chest pain, shortness of breath, GU symptoms.     Objective:   Physical Exam  Constitutional: She appears well-developed and well-nourished.  HENT:  Head: Normocephalic and atraumatic.  Eyes: Pupils are equal, round, and reactive to light.  Neck: Normal range of motion.  Cardiovascular: Normal rate and regular rhythm.   Pulmonary/Chest: Breath sounds normal.  Abdominal: Bowel sounds are normal. There is no tenderness.  Musculoskeletal: She exhibits no edema.   straight leg raises are negative bilaterally as is internal and external rotation of the hip joints. Oropharynx is without erythema, neck is without any pain to palpation.        Assessment & Plan:

## 2011-02-11 NOTE — Patient Instructions (Signed)
I will see you back in 2-3 months. Keep up the good work taking care of yourself. Try the glucosamine chondroitin for the hip pain. Keep up the walking. We are going to refer you to a gastroenterologist for the problems with swallowing.

## 2011-02-11 NOTE — Assessment & Plan Note (Signed)
Blood pressure well controlled continue present medications.

## 2011-02-11 NOTE — Assessment & Plan Note (Signed)
Patient again went away on Pap today will do this when she returns.

## 2011-02-11 NOTE — Assessment & Plan Note (Signed)
See assessment minor dysphagia. Again because of persistent reflux we'll go ahead and refer to GI.

## 2011-02-13 NOTE — Progress Notes (Signed)
Addended by: Maura Crandall on: 02/13/2011 04:44 PM   Modules accepted: Orders

## 2011-02-18 ENCOUNTER — Other Ambulatory Visit: Payer: Self-pay | Admitting: *Deleted

## 2011-02-18 MED ORDER — TRIAMTERENE-HCTZ 75-50 MG PO TABS: 1.0000 | ORAL_TABLET | Freq: Every day | ORAL | Status: DC

## 2011-02-26 ENCOUNTER — Other Ambulatory Visit: Payer: Self-pay | Admitting: *Deleted

## 2011-02-26 MED ORDER — SERTRALINE HCL 100 MG PO TABS: 100.0000 mg | ORAL_TABLET | Freq: Every day | ORAL | Status: DC

## 2011-02-26 NOTE — Telephone Encounter (Signed)
I approved the sertraline refill.  Please call patient and advise her to hold the tramadol until she can discuss this further with Dr. Coralee Pesa, since there is a potential interaction between the sertraline and the tramadol.

## 2011-02-27 NOTE — Telephone Encounter (Signed)
Pt has been informed and voices understanding 

## 2011-02-27 NOTE — Telephone Encounter (Signed)
i have called and gotten vmail, lm for pt to call clinic triage back

## 2011-03-04 NOTE — Telephone Encounter (Signed)
Patient has tolerated this without problem. She can take both.

## 2011-04-02 ENCOUNTER — Other Ambulatory Visit: Payer: Self-pay | Admitting: Internal Medicine

## 2011-05-19 ENCOUNTER — Other Ambulatory Visit: Payer: Self-pay | Admitting: *Deleted

## 2011-05-19 DIAGNOSIS — F329 Major depressive disorder, single episode, unspecified: Secondary | ICD-10-CM

## 2011-05-19 MED ORDER — ALPRAZOLAM 0.5 MG PO TABS: 0.2500 mg | ORAL_TABLET | Freq: Every evening | ORAL | Status: DC | PRN

## 2011-05-19 NOTE — Telephone Encounter (Signed)
Refill called to the [facility].

## 2011-07-31 ENCOUNTER — Other Ambulatory Visit: Payer: Self-pay | Admitting: *Deleted

## 2011-07-31 MED ORDER — PANTOPRAZOLE SODIUM 20 MG PO TBEC: 20.0000 mg | DELAYED_RELEASE_TABLET | Freq: Two times a day (BID) | ORAL | Status: DC

## 2011-07-31 MED ORDER — TRAMADOL HCL 50 MG PO TABS: 50.0000 mg | ORAL_TABLET | Freq: Four times a day (QID) | ORAL | Status: DC | PRN

## 2011-10-09 ENCOUNTER — Other Ambulatory Visit: Payer: Self-pay | Admitting: *Deleted

## 2011-10-09 MED ORDER — QUINAPRIL HCL 20 MG PO TABS: 10.0000 mg | ORAL_TABLET | Freq: Every day | ORAL | Status: DC

## 2011-10-14 ENCOUNTER — Other Ambulatory Visit: Payer: Self-pay | Admitting: *Deleted

## 2011-10-14 DIAGNOSIS — F329 Major depressive disorder, single episode, unspecified: Secondary | ICD-10-CM

## 2011-10-14 MED ORDER — MOMETASONE FUROATE 50 MCG/ACT NA SUSP: 1.0000 | Freq: Every day | NASAL | Status: DC

## 2011-10-14 MED ORDER — ALPRAZOLAM 0.5 MG PO TABS: 0.2500 mg | ORAL_TABLET | Freq: Every evening | ORAL | Status: DC | PRN

## 2011-10-14 NOTE — Telephone Encounter (Signed)
Alprazolam rx called to [facility] pharmacy. 

## 2011-12-12 ENCOUNTER — Other Ambulatory Visit: Payer: Self-pay | Admitting: *Deleted

## 2011-12-18 NOTE — Telephone Encounter (Signed)
Pt has an appt 5/8 w/Dr Milbert Coulter.

## 2011-12-24 ENCOUNTER — Encounter: Payer: Self-pay | Admitting: Internal Medicine

## 2011-12-24 ENCOUNTER — Ambulatory Visit (INDEPENDENT_AMBULATORY_CARE_PROVIDER_SITE_OTHER): Payer: Medicare Other | Admitting: Internal Medicine

## 2011-12-24 VITALS — BP 146/73 | HR 88 | Temp 97.7°F | Ht 60.0 in | Wt 164.4 lb

## 2011-12-24 DIAGNOSIS — Z0000 Encounter for general adult medical examination without abnormal findings: Secondary | ICD-10-CM

## 2011-12-24 DIAGNOSIS — J309 Allergic rhinitis, unspecified: Secondary | ICD-10-CM

## 2011-12-24 DIAGNOSIS — N390 Urinary tract infection, site not specified: Secondary | ICD-10-CM

## 2011-12-24 DIAGNOSIS — R1310 Dysphagia, unspecified: Secondary | ICD-10-CM

## 2011-12-24 DIAGNOSIS — Z1239 Encounter for other screening for malignant neoplasm of breast: Secondary | ICD-10-CM

## 2011-12-24 DIAGNOSIS — R002 Palpitations: Secondary | ICD-10-CM

## 2011-12-24 DIAGNOSIS — Z1231 Encounter for screening mammogram for malignant neoplasm of breast: Secondary | ICD-10-CM

## 2011-12-24 DIAGNOSIS — M549 Dorsalgia, unspecified: Secondary | ICD-10-CM

## 2011-12-24 DIAGNOSIS — R232 Flushing: Secondary | ICD-10-CM

## 2011-12-24 DIAGNOSIS — Z1211 Encounter for screening for malignant neoplasm of colon: Secondary | ICD-10-CM

## 2011-12-24 DIAGNOSIS — E785 Hyperlipidemia, unspecified: Secondary | ICD-10-CM

## 2011-12-24 DIAGNOSIS — F3289 Other specified depressive episodes: Secondary | ICD-10-CM

## 2011-12-24 DIAGNOSIS — R7309 Other abnormal glucose: Secondary | ICD-10-CM

## 2011-12-24 DIAGNOSIS — Z23 Encounter for immunization: Secondary | ICD-10-CM

## 2011-12-24 DIAGNOSIS — R739 Hyperglycemia, unspecified: Secondary | ICD-10-CM

## 2011-12-24 DIAGNOSIS — B029 Zoster without complications: Secondary | ICD-10-CM

## 2011-12-24 DIAGNOSIS — F329 Major depressive disorder, single episode, unspecified: Secondary | ICD-10-CM

## 2011-12-24 DIAGNOSIS — R300 Dysuria: Secondary | ICD-10-CM | POA: Insufficient documentation

## 2011-12-24 DIAGNOSIS — I10 Essential (primary) hypertension: Secondary | ICD-10-CM

## 2011-12-24 DIAGNOSIS — G4700 Insomnia, unspecified: Secondary | ICD-10-CM

## 2011-12-24 DIAGNOSIS — K219 Gastro-esophageal reflux disease without esophagitis: Secondary | ICD-10-CM

## 2011-12-24 HISTORY — DX: Palpitations: R00.2

## 2011-12-24 HISTORY — DX: Flushing: R23.2

## 2011-12-24 LAB — LIPID PANEL
HDL: 58 mg/dL (ref >39)
Triglycerides: 102 mg/dL (ref <150)

## 2011-12-24 LAB — COMPREHENSIVE METABOLIC PANEL
CO2: 26 mEq/L (ref 19–32)
Calcium: 9.5 mg/dL (ref 8.4–10.5)
Creat: 0.91 mg/dL (ref 0.50–1.10)
Glucose, Bld: 118 mg/dL — ABNORMAL HIGH (ref 70–99)
Sodium: 139 mEq/L (ref 135–145)
Total Bilirubin: 0.7 mg/dL (ref 0.3–1.2)
Total Protein: 7.9 g/dL (ref 6.0–8.3)

## 2011-12-24 LAB — CBC
MCH: 27.8 pg (ref 26.0–34.0)
MCHC: 33.3 g/dL (ref 30.0–36.0)
MCV: 83.3 fL (ref 78.0–100.0)
Platelets: 209 10*3/uL (ref 150–400)
RDW: 13.4 % (ref 11.5–15.5)
WBC: 3.8 10*3/uL — ABNORMAL LOW (ref 4.0–10.5)

## 2011-12-24 LAB — TSH: TSH: 2.372 u[IU]/mL (ref 0.350–4.500)

## 2011-12-24 MED ORDER — ALPRAZOLAM 0.5 MG PO TABS: 0.2500 mg | ORAL_TABLET | Freq: Every evening | ORAL | Status: DC | PRN

## 2011-12-24 MED ORDER — BUPROPION HCL ER (XL) 150 MG PO TB24: 150.0000 mg | ORAL_TABLET | Freq: Every day | ORAL | Status: DC

## 2011-12-24 MED ORDER — SERTRALINE HCL 100 MG PO TABS: 100.0000 mg | ORAL_TABLET | Freq: Every day | ORAL | Status: DC

## 2011-12-24 MED ORDER — PANTOPRAZOLE SODIUM 20 MG PO TBEC: 20.0000 mg | DELAYED_RELEASE_TABLET | Freq: Two times a day (BID) | ORAL | Status: DC

## 2011-12-24 MED ORDER — MOMETASONE FUROATE 50 MCG/ACT NA SUSP: 1.0000 | Freq: Every day | NASAL | Status: DC

## 2011-12-24 MED ORDER — TRIAMTERENE-HCTZ 75-50 MG PO TABS: 1.0000 | ORAL_TABLET | Freq: Every day | ORAL | Status: DC

## 2011-12-24 MED ORDER — TRAMADOL HCL 50 MG PO TABS: 50.0000 mg | ORAL_TABLET | Freq: Four times a day (QID) | ORAL | Status: DC | PRN

## 2011-12-24 MED ORDER — ZOSTER VACCINE LIVE 19400 UNT/0.65ML ~~LOC~~ SOLR: 0.6500 mL | Freq: Once | SUBCUTANEOUS | Status: AC

## 2011-12-24 MED ORDER — ZOSTER VACCINE LIVE 19400 UNT/0.65ML ~~LOC~~ SOLR: 0.6500 mL | Freq: Once | SUBCUTANEOUS | Status: DC

## 2011-12-24 MED ORDER — QUINAPRIL HCL 20 MG PO TABS: 10.0000 mg | ORAL_TABLET | Freq: Every day | ORAL | Status: DC

## 2011-12-24 NOTE — Assessment & Plan Note (Addendum)
Not accompanied by chest pain or shortness of breath. In addition to palpitations she has been experiencing hand tremor, insomnia, heat intolerance and change in hair texture. With this combination of symptoms, I suspect hyperthyroidism. Will also check CBC to screen for anemia  -Check TSH, CBC

## 2011-12-24 NOTE — Progress Notes (Signed)
Subjective:   Patient ID: Christina Stevenson female   DOB: 08/15/1946 66 y.o.   MRN: [id number]  HPI: [name] is a 66 y.o. female with past medical history significant for hypertension, dysphagia, GERD, spinal stenosis and osteoarthritis who presents today for routine followup. She was last seen by Dr. Coralee Pesa on 02/11/2011.  Today she complains of occasional heart palpitations, that is not accompanied by shortness of breath or chest pain. In addition to this, she experiences change in hair texture (frizzy),  insomnia and hand tremors.  The hand tremor is isolated to the left upper extremity and seemed to be more spasm-like.    She also complains of increased hot flashes for the last 3 months. She underwent menopause years ago. With hot flashes, she has noticed increased facial flushing.  She notes burning with urination, which has occurred for some time. No blood in urine. No frequency.  She tells me that the tramadol does not help with her back pain, but pain is better with Advil.  She has recently experienced gradual with congestion due to seasonal allergies.  Regarding her dysphagia and GERD, she tells me that she did not follow through with endoscopy and dilation as suggested by gastroenterology as she did not like her gastroenterologist. For this reason she is also not interested in having a colonoscopy. She still feels like she is swallowing on one side of her throat.  Regarding her depression, she notes that her mood is improved, though has been down for the last few days, likely because mother states approaching and her sign lives at a nursing facility because he suffered from a stroke. She has only been taking Wellbutrin daily. She discontinued Zoloft several months prior.   Current Outpatient Prescriptions  Medication Sig Dispense Refill  . ALPRAZolam (XANAX) 0.5 MG tablet Take 0.5 tablets (0.25 mg total) by mouth at bedtime as needed.  45 tablet  0  . buPROPion  (WELLBUTRIN XL) 150 MG 24 hr tablet Take 1 tablet (150 mg total) by mouth daily.  30 tablet  11  . cyclobenzaprine (FLEXERIL) 10 MG tablet Take 1 tablet (10 mg total) by mouth at bedtime as needed. For muscle spasm.  30 tablet  3  . mometasone (NASONEX) 50 MCG/ACT nasal spray Place 1 spray into the nose daily.  17 g  3  . pantoprazole (PROTONIX) 20 MG tablet Take 1 tablet (20 mg total) by mouth 2 (two) times daily. Once before breakfast and the other before dinner.  60 tablet  5  . quinapril (ACCUPRIL) 20 MG tablet Take 0.5 tablets (10 mg total) by mouth daily.  30 tablet  0  . traMADol (ULTRAM) 50 MG tablet Take 1-2 tablets (50-100 mg total) by mouth 4 (four) times daily as needed. For pain.  120 tablet  4  . DISCONTD: buPROPion (WELLBUTRIN XL) 150 MG 24 hr tablet Take 1 tablet (150 mg total) by mouth daily.  30 tablet  11  . DISCONTD: mometasone (NASONEX) 50 MCG/ACT nasal spray Place 1 spray into the nose daily.  17 g  3  . DISCONTD: pantoprazole (PROTONIX) 20 MG tablet Take 1 tablet (20 mg total) by mouth 2 (two) times daily. Once before breakfast and the other before dinner.  60 tablet  5  . DISCONTD: quinapril (ACCUPRIL) 20 MG tablet Take 0.5 tablets (10 mg total) by mouth daily.  30 tablet  0  . sertraline (ZOLOFT) 100 MG tablet Take 1 tablet (100 mg total) by mouth daily. For  the first week, please cut tablets in half and take 50mg  daily.  After one week, you may continue 100 mg (1 full tablet) daily.  30 tablet  5  . triamterene-hydrochlorothiazide (MAXZIDE) 75-50 MG per tablet Take 1 tablet by mouth daily. 1/4-1/2 tablet once a day as needed.  30 tablet  5  . zoster vaccine live, PF, (ZOSTAVAX) 16109 UNT/0.65ML injection Inject 19,400 Units into the skin once.  1 each  0  . DISCONTD: sertraline (ZOLOFT) 100 MG tablet take 1 tablet by mouth daily  31 tablet  5  . DISCONTD: triamterene-hydrochlorothiazide (MAXZIDE) 75-50 MG per tablet Take 1 tablet by mouth daily. 1/4-1/2 tablet once a day as  needed.  30 tablet  5   Review of Systems: Constitutional: Denies fever, chills, diaphoresis, appetite change and fatigue.  HEENT: Denies photophobia, eye pain, redness, hearing loss, ear pain, neck pain, neck stiffness and tinnitus.   Respiratory: Denies SOB, DOE, cough, chest tightness,  and wheezing.   Cardiovascular: Denies chest pain and leg swelling.  Gastrointestinal: Denies nausea, vomiting, abdominal pain, diarrhea, constipation, blood in stool and abdominal distention.  Genitourinary: Denies urgency, frequency, hematuria, flank pain and difficulty urinating.  Musculoskeletal: chronic back pain  Skin: Denies pallor, rash and wound.  Neurological: Denies dizziness, seizures, syncope, weakness, light-headedness, numbness and headaches.  Psychiatric/Behavioral: Denies suicidal ideation, mood changes, confusion, nervousness,  and agitation; she sleeps well after she takes 0.25 of Xanax before bed   Objective:  Physical Exam: Filed Vitals:   12/24/11 0900  BP: 146/73  Pulse: 88  Temp: 97.7 F (36.5 C)  TempSrc: Oral  Height: 5' (1.524 m)  Weight: 164 lb 6.4 oz (74.571 kg)  SpO2: 99%   Constitutional: Vital signs reviewed.  Patient is a well-developed and well-nourished woman  in no acute distress and cooperative with exam.    Head: Normocephalic and atraumatic Ear: TM normal bilaterally Mouth: no erythema or exudates, MMM Eyes: PERRL, EOMI, conjunctivae normal, No scleral icterus.  Neck: Supple, Trachea midline normal ROM, No JVD, mass, thyromegaly, or carotid bruit present.  Cardiovascular: RRR, S1 normal, S2 normal, no MRG, pulses symmetric and intact bilaterally Pulmonary/Chest: CTAB, no wheezes, rales, or rhonchi Abdominal: Soft. Non-tender, non-distended, bowel sounds are normal, no masses, organomegaly, or guarding present.  Musculoskeletal: No joint deformities, erythema, or stiffness, ROM full and nontender; fasciculation over her left thenar muscles, but no muscle  atrophy  Neurological: A&O x3, Strength is normal and symmetric bilaterally, cranial nerve II-XII are grossly intact, no focal motor deficit Skin: Warm, dry and intact. No rash, cyanosis, or clubbing.  Psychiatric: Normal mood and affect. speech and behavior is normal. Judgment and thought content normal. Cognition and memory are normal.   Assessment & Plan:    Case and care discussed with Dr. Josem Kaufmann. Patient to follow up in 3 months for BP & vasomotor type/hyperthyroid type symptom follow up.  Please see prob oriented charting for further detail.

## 2011-12-24 NOTE — Assessment & Plan Note (Signed)
Patient's last LDL was 144. She is currently not on a statin. We will recheck lipid panel today.

## 2011-12-24 NOTE — Assessment & Plan Note (Signed)
Lab Results  Component Value Date   NA 138 11/19/2010   K 4.3 11/19/2010   CL 99 11/19/2010   CO2 27 11/19/2010   BUN 14 11/19/2010   CREATININE 1.01 11/19/2010   CREATININE 0.97 05/27/2010    BP Readings from Last 3 Encounters:  12/24/11 146/73  02/11/11 131/68  01/14/11 120/70    Assessment: Hypertension control:  mildly elevated  Progress toward goals:  deteriorated Barriers to meeting goals:  nonadherence to medications  Plan: Hypertension treatment:  continue current medications ; Patient had been out of medications, and I had not filled them since she had not had an appointment in a year. We will refill medications today and recheck blood pressure in 3 months.

## 2011-12-24 NOTE — Assessment & Plan Note (Signed)
Per chart review, patient was wrongly diagnosed with adult onset diabetes mellitus. Her last A1c was 5.6. I do not suspect that she has diabetes at this time.  -Recheck A1c

## 2011-12-24 NOTE — Patient Instructions (Addendum)
-  Regarding your back pain, you may take advil & tramadol as needed.  If your labs show me that your kidney function cannot tolerate advil, I will call you.  -I believe Sertraline (Zoloft) will help with you hot flashes, burning with urination and facial flushing.  Let's try restarting this medication and have your return to see me in 3 months. -->  For the first week, please take 50mg  daily (cut tablet in half).  After the first week, you can take 100mg  (1 full tablet) daily.  -Regarding the tremor in your left arm, I am checking some labs today to try to determine the cause.  -Please bring the stool cards back to the lab to be tested.  -Please be sure to have the barium esophagus study done as soon as possible.   -Please be sure to bring all of your medications with you to every visit.  Should you have any new or worsening symptoms, please be sure to call the clinic at [phone].

## 2011-12-24 NOTE — Assessment & Plan Note (Signed)
Referral for mammogram

## 2011-12-24 NOTE — Assessment & Plan Note (Signed)
Mood has been relatively better, and she is only been taking Wellbutrin. Because her vasomotor symptoms seem to be worse since she has discontinued sertraline, I will ask her restart this medication.  -Continue Wellbutrin and sertraline

## 2011-12-24 NOTE — Assessment & Plan Note (Signed)
Stable with twice a day Protonix

## 2011-12-24 NOTE — Assessment & Plan Note (Addendum)
Patient continues to decline colonoscopy at this time. She did not enjoy her last experience, and was not pleased with her gastroenterologist. For now, I've given her stool cards which she will return to the clinic. Depending on the results, we will readdress colonoscopy at next visit in 3 months.  Will also check CBC to screen for anemia

## 2011-12-24 NOTE — Assessment & Plan Note (Signed)
She had been [facility] for [facility]. She felt that she did not get along well with her gastroenterologist, so she never had any procedures done. A note from Dr. Madilyn Fireman of [facility] suggest that she was going to undergo endoscopy with esophageal dilation.    -Barium esophagram to evaluate for esophageal stricture and/or spasm -Continue protonix

## 2011-12-24 NOTE — Assessment & Plan Note (Addendum)
I suspect that dysuria may be secondary to vaginal atrophy, but we will check a urinalysis with reflux micro to rule out UTI  ADDENDUM: Patient's UA positive for leukocytes and nitrites, and micro-reveals 3-6 WBCs.  Given the patient is symptomatic, I will prescribe bactrim x3d. Patient has [place] health care so I suspect that she should be able to afford this medication, if she cannot I will change the antibiotic.  I will not obtain urine culture because her symptoms are characteristic of UTI and are not complicated.  We will culture if symptoms persist or recur within three months following antimicrobial therapy. I called and discussed plan with patient on 12/25/11.  She is not allergic to any antibiotics that she knows of.  She is agreeable.  She knows to call back if symptoms don't resolve.

## 2011-12-24 NOTE — Assessment & Plan Note (Signed)
Continue Nasonex during allergy season.

## 2011-12-24 NOTE — Assessment & Plan Note (Signed)
Patient is postmenopausal, but for the last 3 months or so she's experienced an increase in hot flashes and facial flushing. Facial flushing does not look like rosacea. She has also been off of her SSRI for the last few months. Vasomotor symptoms may be lessened by SSRIs, and the symptoms may be exacerbated since she's been off this medication. We will restart Zoloft and see if the symptoms resolve.

## 2011-12-24 NOTE — Assessment & Plan Note (Signed)
Patient given prescription for zoster vaccine

## 2011-12-25 ENCOUNTER — Ambulatory Visit (HOSPITAL_COMMUNITY)
Admission: RE | Admit: 2011-12-25 | Discharge: 2011-12-25 | Disposition: A | Discharge status: Home / Self Care | Payer: Medicare Other | Source: Ambulatory Visit | Attending: Internal Medicine | Admitting: Internal Medicine

## 2011-12-25 DIAGNOSIS — K219 Gastro-esophageal reflux disease without esophagitis: Secondary | ICD-10-CM | POA: Insufficient documentation

## 2011-12-25 DIAGNOSIS — K449 Diaphragmatic hernia without obstruction or gangrene: Secondary | ICD-10-CM | POA: Insufficient documentation

## 2011-12-25 DIAGNOSIS — R1310 Dysphagia, unspecified: Secondary | ICD-10-CM | POA: Insufficient documentation

## 2011-12-25 LAB — URINALYSIS, MICROSCOPIC ONLY
Bacteria, UA: NONE SEEN
Crystals: NONE SEEN

## 2011-12-25 LAB — URINALYSIS, ROUTINE W REFLEX MICROSCOPIC
Hgb urine dipstick: NEGATIVE
Nitrite: POSITIVE — AB
Urobilinogen, UA: 0.2 mg/dL (ref 0.0–1.0)
pH: 7 (ref 5.0–8.0)

## 2011-12-25 MED ORDER — SULFAMETHOXAZOLE-TRIMETHOPRIM 800-160 MG PO TABS: 1.0000 | ORAL_TABLET | Freq: Two times a day (BID) | ORAL | Status: AC

## 2011-12-25 NOTE — Progress Notes (Signed)
Addended by: Alanson Puls on: 12/25/2011 08:22 AM   Modules accepted: Orders

## 2012-01-15 ENCOUNTER — Other Ambulatory Visit (INDEPENDENT_AMBULATORY_CARE_PROVIDER_SITE_OTHER): Payer: Medicare Other

## 2012-01-15 DIAGNOSIS — Z1211 Encounter for screening for malignant neoplasm of colon: Secondary | ICD-10-CM

## 2012-01-15 LAB — POC HEMOCCULT BLD/STL (HOME/3-CARD/SCREEN)
Card #2 Fecal Occult Blod, POC: NEGATIVE
Fecal Occult Blood, POC: NEGATIVE

## 2012-01-15 NOTE — Progress Notes (Signed)
Addended by: Bufford Spikes on: 01/15/2012 08:55 AM   Modules accepted: Orders

## 2012-01-19 ENCOUNTER — Other Ambulatory Visit: Payer: Self-pay | Admitting: *Deleted

## 2012-01-19 ENCOUNTER — Ambulatory Visit (HOSPITAL_COMMUNITY)
Admission: RE | Admit: 2012-01-19 | Discharge: 2012-01-19 | Disposition: A | Discharge status: Home / Self Care | Payer: Medicare Other | Source: Ambulatory Visit | Attending: Internal Medicine | Admitting: Internal Medicine

## 2012-01-19 DIAGNOSIS — Z1231 Encounter for screening mammogram for malignant neoplasm of breast: Secondary | ICD-10-CM | POA: Insufficient documentation

## 2012-01-19 DIAGNOSIS — Z1239 Encounter for other screening for malignant neoplasm of breast: Secondary | ICD-10-CM

## 2012-01-21 NOTE — Telephone Encounter (Signed)
Pharmacy aware Rx denied by Dr Milbert Coulter. Stanton Kidney Maruice Pieroni RN 01/21/12 9:30AM

## 2012-02-25 ENCOUNTER — Other Ambulatory Visit: Payer: Self-pay | Admitting: *Deleted

## 2012-02-25 DIAGNOSIS — F329 Major depressive disorder, single episode, unspecified: Secondary | ICD-10-CM

## 2012-02-25 DIAGNOSIS — I10 Essential (primary) hypertension: Secondary | ICD-10-CM

## 2012-03-01 MED ORDER — QUINAPRIL HCL 20 MG PO TABS: 10.0000 mg | ORAL_TABLET | Freq: Every day | ORAL | Status: DC

## 2012-03-01 MED ORDER — ALPRAZOLAM 0.5 MG PO TABS: 0.2500 mg | ORAL_TABLET | Freq: Every evening | ORAL | Status: DC | PRN

## 2012-03-01 NOTE — Telephone Encounter (Signed)
Refill approved - nurse to complete. 

## 2012-03-01 NOTE — Telephone Encounter (Signed)
Rx called in 

## 2012-04-15 ENCOUNTER — Ambulatory Visit (INDEPENDENT_AMBULATORY_CARE_PROVIDER_SITE_OTHER): Payer: Medicare Other | Admitting: Internal Medicine

## 2012-04-15 ENCOUNTER — Encounter: Payer: Self-pay | Admitting: Internal Medicine

## 2012-04-15 VITALS — BP 147/80 | HR 87 | Temp 98.3°F | Ht 61.0 in | Wt 168.8 lb

## 2012-04-15 DIAGNOSIS — Z1211 Encounter for screening for malignant neoplasm of colon: Secondary | ICD-10-CM

## 2012-04-15 DIAGNOSIS — R1310 Dysphagia, unspecified: Secondary | ICD-10-CM

## 2012-04-15 DIAGNOSIS — F329 Major depressive disorder, single episode, unspecified: Secondary | ICD-10-CM

## 2012-04-15 DIAGNOSIS — R253 Fasciculation: Secondary | ICD-10-CM

## 2012-04-15 DIAGNOSIS — K219 Gastro-esophageal reflux disease without esophagitis: Secondary | ICD-10-CM

## 2012-04-15 DIAGNOSIS — G20 Parkinson's disease: Secondary | ICD-10-CM | POA: Insufficient documentation

## 2012-04-15 DIAGNOSIS — B029 Zoster without complications: Secondary | ICD-10-CM

## 2012-04-15 DIAGNOSIS — M549 Dorsalgia, unspecified: Secondary | ICD-10-CM

## 2012-04-15 DIAGNOSIS — R259 Unspecified abnormal involuntary movements: Secondary | ICD-10-CM

## 2012-04-15 DIAGNOSIS — I10 Essential (primary) hypertension: Secondary | ICD-10-CM

## 2012-04-15 MED ORDER — CYCLOBENZAPRINE HCL 10 MG PO TABS: 10.0000 mg | ORAL_TABLET | Freq: Two times a day (BID) | ORAL | Status: DC | PRN

## 2012-04-15 MED ORDER — ALPRAZOLAM 0.5 MG PO TABS: 0.2500 mg | ORAL_TABLET | Freq: Every evening | ORAL | Status: DC | PRN

## 2012-04-15 MED ORDER — SERTRALINE HCL 100 MG PO TABS: 100.0000 mg | ORAL_TABLET | Freq: Every day | ORAL | Status: DC

## 2012-04-15 NOTE — Assessment & Plan Note (Addendum)
Progressively worsening right thenar muscle fasciculations without atrophy. Bothersome to patient. May be related to cervical disc disease that she has had in the past. Less likely ALS.  -Refer to neurology

## 2012-04-15 NOTE — Assessment & Plan Note (Signed)
Lab Results  Component Value Date   NA 139 12/24/2011   K 4.0 12/24/2011   CL 103 12/24/2011   CO2 26 12/24/2011   BUN 14 12/24/2011   CREATININE 0.91 12/24/2011   CREATININE 0.97 05/27/2010    BP Readings from Last 3 Encounters:  04/15/12 147/80  12/24/11 146/73  02/11/11 131/68    Assessment: Hypertension control:  mildly elevated  Progress toward goals:  unchanged Barriers to meeting goals:  no barriers identified  Plan: Hypertension treatment:  continue current medications -patient has not taken medications today. For now will continue quinapril 10 mg daily and Maxide 75-50 mg daily

## 2012-04-15 NOTE — Progress Notes (Signed)
Subjective:   Patient ID: Christina Stevenson female   DOB: 06-18-1946 66 y.o.   MRN: [id number]  HPI: [name] is a 66 y.o. woman with history of hypertension, cervical spinal stenosis, reflux disease with hiatal hernia, and arthritis who presents today for routine followup. I last saw patient on 12/24/2011. Today, she does not complaining of hot flashes, but she does report persistent, worsening right hand tremors have worsened since my last visit with her. She also reports persistent dysphagia. Finally, she notes increased stressors, as her husband and herself are dealing with problems at the nursing facility where their son is.  Regarding her back pain, she reports improvement with Advil, but tramadol is of no use.   Past Medical History  Diagnosis Date  . Hypertension   . Uterovaginal prolapse, incomplete     with cystocele  . Spinal stenosis in cervical region   . Hyperlipemia   . Allergic rhinitis, cause unspecified   . Irritable bowel syndrome   . Bundle branch block, unspecified   . GERD (gastroesophageal reflux disease)     on bid omeprazole  . Depression 08/2009    multiple stressors: including son who has suffered 2 CVA's and is in a rehab facility, financial difficulties, loss of pets, etc.  . Osteoarthrosis, unspecified whether generalized or localized, unspecified site    Current Outpatient Prescriptions  Medication Sig Dispense Refill  . ALPRAZolam (XANAX) 0.5 MG tablet Take 0.5 tablets (0.25 mg total) by mouth at bedtime as needed.  30 tablet  0  . buPROPion (WELLBUTRIN XL) 150 MG 24 hr tablet Take 1 tablet (150 mg total) by mouth daily.  30 tablet  11  . mometasone (NASONEX) 50 MCG/ACT nasal spray Place 1 spray into the nose daily.  17 g  3  . pantoprazole (PROTONIX) 20 MG tablet Take 1 tablet (20 mg total) by mouth 2 (two) times daily. Once before breakfast and the other before dinner.  60 tablet  5  . quinapril (ACCUPRIL) 20 MG tablet Take 0.5 tablets (10 mg  total) by mouth daily.  30 tablet  1  . sertraline (ZOLOFT) 100 MG tablet Take 1 tablet (100 mg total) by mouth daily.  30 tablet  5  . triamterene-hydrochlorothiazide (MAXZIDE) 75-50 MG per tablet Take 1 tablet by mouth daily. 1/4-1/2 tablet once a day as needed.  30 tablet  5  . DISCONTD: sertraline (ZOLOFT) 100 MG tablet Take 1 tablet (100 mg total) by mouth daily. For the first week, please cut tablets in half and take 50mg  daily.  After one week, you may continue 100 mg (1 full tablet) daily.  30 tablet  5  . cyclobenzaprine (FLEXERIL) 10 MG tablet Take 1 tablet (10 mg total) by mouth 2 (two) times daily as needed. For muscle spasm.  30 tablet  1   No family history on file. History   Social History  . Marital Status: Married    Spouse Name: N/A    Number of Children: N/A  . Years of Education: N/A   Social History Main Topics  . Smoking status: Never Smoker   . Smokeless tobacco: None  . Alcohol Use: No  . Drug Use: No  . Sexually Active: None   Other Topics Concern  . None   Social History Narrative   Son Thayer Ohm suffered CVA x2, trach, now in rehab facility-2011Regular exercise-yes   Review of Systems: Constitutional: Denies fever, chills, diaphoresis, appetite change and fatigue.  HEENT: Denies photophobia, eye  pain, redness, hearing loss, ear pain, congestion, sore throat, rhinorrhea, sneezing, mouth sores, trouble swallowing, neck pain, neck stiffness and tinnitus.   Respiratory: Denies SOB, DOE, cough, chest tightness,  and wheezing.   Cardiovascular: Denies chest pain, palpitations and leg swelling.  Gastrointestinal: Denies nausea, vomiting, abdominal pain, diarrhea, constipation, blood in stool and abdominal distention.  Genitourinary: Denies dysuria, urgency, frequency, hematuria, flank pain and difficulty urinating.  Musculoskeletal: Denies joint swelling, gait problem.  Skin: Denies pallor, rash and wound.  Neurological: Denies dizziness, seizures, syncope,  weakness, light-headedness, numbness and headaches.  Psychiatric/Behavioral: Denies suicidal ideation, mood changes, confusion, nervousness, sleep disturbance and agitation; continues to sleep well after she takes 0.25 mg Xanax before bed  Objective:  Physical Exam: Filed Vitals:   04/15/12 0945  BP: 147/80  Pulse: 87  Temp: 98.3 F (36.8 C)  TempSrc: Oral  Height: 5\' 1"  (1.549 m)  Weight: 168 lb 12.8 oz (76.567 kg)   Constitutional: Vital signs reviewed.  Patient is a well-developed and well-nourished woman in no acute distress and cooperative with exam.  Mouth: no erythema or exudates, MMM Eyes: PERRL, EOMI, conjunctivae normal, No scleral icterus.  Cardiovascular: RRR, S1 normal, S2 normal, no MRG, pulses symmetric and intact bilaterally Pulmonary/Chest: CTAB, no wheezes, rales, or rhonchi Abdominal: Soft. Non-tender, non-distended, bowel sounds are normal, no masses, organomegaly, or guarding present.  Musculoskeletal: No joint deformities, erythema, or stiffness, ROM full and no nontender Neurological: A&O x3, Strength is normal and symmetric bilaterally, cranial nerve II-XII are grossly intact, no focal motor deficit, sensory intact to light touch bilaterally. Right thenar fasciculations without atrophy Skin: Warm, dry and intact. No rash, cyanosis, or clubbing.  Psychiatric: Normal mood and affect. speech and behavior is normal. Judgment and thought content normal. Cognition and memory are normal.   Assessment & Plan:  Case and care discussed with Dr. Aundria Rud. Please see problem-oriented charting for further details. Patient to return in 3 months regarding dysphasia and tremors.

## 2012-04-15 NOTE — Assessment & Plan Note (Signed)
Patient declined colonoscopy, so last visit she was given stool cards. Stool cards are negative x3. We will reconsider colonoscopy in 3 years.

## 2012-04-15 NOTE — Assessment & Plan Note (Signed)
Patient would like to try to take PPI 20-30 minutes prior to each meal. If this problem has not improved by next visit, we will re-refer her back to GI for endoscopy

## 2012-04-15 NOTE — Assessment & Plan Note (Signed)
Vaccination was to expensive. Patient did not have vaccination

## 2012-04-15 NOTE — Assessment & Plan Note (Signed)
Patient was to continue to monitor for the next few months, she will take Protonix 20-30 minutes prior to her morning meal. If problem persists, we will refer back to GI for endoscopy

## 2012-04-15 NOTE — Patient Instructions (Addendum)
-   I have referred you to Neurology for the spasms in your hand.  We will let you know when that appointment is. -Start taking your protonix 30 minutes before breakfast - if your symptoms persist, we will refer you back to GI. -You may continue taking ibuprofen for your back pain.  Please be sure to bring all of your medications with you to every visit.  Should you have any new or worsening symptoms, please be sure to call the clinic at [phone].

## 2012-05-04 ENCOUNTER — Other Ambulatory Visit: Payer: Self-pay | Admitting: Internal Medicine

## 2012-05-20 ENCOUNTER — Ambulatory Visit (INDEPENDENT_AMBULATORY_CARE_PROVIDER_SITE_OTHER): Payer: Medicare Other | Admitting: Internal Medicine

## 2012-05-20 ENCOUNTER — Encounter: Payer: Self-pay | Admitting: Internal Medicine

## 2012-05-20 VITALS — BP 129/67 | HR 90 | Temp 98.2°F | Wt 170.9 lb

## 2012-05-20 DIAGNOSIS — R1310 Dysphagia, unspecified: Secondary | ICD-10-CM

## 2012-05-20 DIAGNOSIS — F329 Major depressive disorder, single episode, unspecified: Secondary | ICD-10-CM

## 2012-05-20 DIAGNOSIS — F3289 Other specified depressive episodes: Secondary | ICD-10-CM

## 2012-05-20 DIAGNOSIS — R253 Fasciculation: Secondary | ICD-10-CM

## 2012-05-20 DIAGNOSIS — R259 Unspecified abnormal involuntary movements: Secondary | ICD-10-CM

## 2012-05-20 DIAGNOSIS — M533 Sacrococcygeal disorders, not elsewhere classified: Secondary | ICD-10-CM

## 2012-05-20 MED ORDER — IBUPROFEN 200 MG PO TABS: 800.0000 mg | ORAL_TABLET | Freq: Three times a day (TID) | ORAL | Status: AC

## 2012-05-20 NOTE — Assessment & Plan Note (Signed)
Acute. With posterior leg pain & occasional radiation.  May be idiopathic inflammation.  Other differentials include herniated disk (possibly S1) vs lateral femoral cutaneous syndrome (but no sensory loss).    -Trial of ibuprofen 800mg  TID (good renal function) for 7-14 days (scheduled for 7 days at a minimum) -Sleep with a pillow between her legs -RTC in 2 weeks if no better, then can consider imaging (MRI vs XR) vs PT vs sports medicine referral -Informed her of red flag symptoms (increased urinary/bowel incontinence) or worsening pain - she should seek medical attn ASAP if this occurs

## 2012-05-20 NOTE — Assessment & Plan Note (Signed)
Patient to make neurology appointment. No tongue fasciculations or blurry vision so less concern for myasthenia / ALS.

## 2012-05-20 NOTE — Patient Instructions (Addendum)
-  Take ibuprofen (advil) 800mg  three times a day for 7-14 days; stop taking scheduled ibuprofen after 1 week if symptoms resolve.  If symptoms haven't resolved after 2 weeks, please return to clinic for further evaluation.  -Also, try sleeping with a pillow between your legs and consider sitting on a donut pillow.  -Please be sure to go to the neurologist regarding your hand fasciculations   Please be sure to bring all of your medications with you to every visit.  Should you have any new or worsening symptoms, please be sure to call the clinic at [phone].

## 2012-05-20 NOTE — Progress Notes (Signed)
Subjective:   Patient ID: Christina Stevenson female   DOB: 1946-05-06 66 y.o.   MRN: [id number]  HPI: [name] is a 66 y.o. woman with history of hypertension, cervical spinal stenosis, reflux disease with hiatal hernia, and arthritis who presents today for follow up regarding right buttocks pain that is awaking her at night when she tries to turn over (aching) - last night was the first time she awoke from pain.  When she tries to walk, she experiences sharp pain that shoots down the leg (lateral aspect) & wraps around ankle, similar to prior to when she had back surgery years ago.  Has been worsening over the last 2-3 weeks ago.  No inciting incident.  Worse with walking.  Better with sitting. Relief with advil. No numbness/tingling. Notes occasional urge incontinence (fecal and urine), but that was prior to this presentation (approx last 6 months). At worst 9/10, at best 0/10.  Radiate to the right groin.  Already sleeps with pillow between legs.  She also notes worsening hand tremors, and was most recently seen on 04/15/12 regarding the same problem.  At that visit, we referred her to neurology.  Of note, when she was seen on 12/24/11 with similar complaints, we checked TSH which was wnl. She denies vision changes or tongue fasciculations.   She reports improved mood, as stressors with son are less at present.  Improved swallowing with use of protonix 20-73min prior to AM meal.  Past Medical History  Diagnosis Date  . Hypertension   . Uterovaginal prolapse, incomplete     with cystocele  . Spinal stenosis in cervical region   . Hyperlipemia   . Allergic rhinitis, cause unspecified   . Irritable bowel syndrome   . Bundle branch block, unspecified   . GERD (gastroesophageal reflux disease)     on bid omeprazole  . Depression 08/2009    multiple stressors: including son who has suffered 2 CVA's and is in a rehab facility, financial difficulties, loss of pets, etc.  . Osteoarthrosis,  unspecified whether generalized or localized, unspecified site    Current Outpatient Prescriptions  Medication Sig Dispense Refill  . ALPRAZolam (XANAX) 0.5 MG tablet Take 0.5 tablets (0.25 mg total) by mouth at bedtime as needed.  30 tablet  0  . buPROPion (WELLBUTRIN XL) 150 MG 24 hr tablet Take 1 tablet (150 mg total) by mouth daily.  30 tablet  11  . cyclobenzaprine (FLEXERIL) 10 MG tablet Take 1 tablet (10 mg total) by mouth 2 (two) times daily as needed. For muscle spasm.  30 tablet  1  . mometasone (NASONEX) 50 MCG/ACT nasal spray Place 1 spray into the nose daily.  17 g  3  . pantoprazole (PROTONIX) 20 MG tablet Take 1 tablet (20 mg total) by mouth 2 (two) times daily. Once before breakfast and the other before dinner.  60 tablet  5  . quinapril (ACCUPRIL) 20 MG tablet take 1 tablet by mouth once daily  30 tablet  5  . sertraline (ZOLOFT) 100 MG tablet Take 1 tablet (100 mg total) by mouth daily.  30 tablet  5  . triamterene-hydrochlorothiazide (MAXZIDE) 75-50 MG per tablet Take 1 tablet by mouth daily. 1/4-1/2 tablet once a day as needed.  30 tablet  5   No family history on file. History   Social History  . Marital Status: Married    Spouse Name: N/A    Number of Children: N/A  . Years of Education: N/A  Social History Main Topics  . Smoking status: Never Smoker   . Smokeless tobacco: Not on file  . Alcohol Use: No  . Drug Use: No  . Sexually Active: Not on file   Other Topics Concern  . Not on file   Social History Narrative   Son Thayer Ohm suffered CVA x2, trach, now in rehab facility-2011Regular exercise-yes   Review of Systems: Constitutional: Denies fever, chills, diaphoresis, appetite change and fatigue.  HEENT: Denies photophobia, eye pain, redness, hearing loss, ear pain, congestion, sore throat, rhinorrhea, sneezing, mouth sores, trouble swallowing, neck pain, neck stiffness and tinnitus.  Respiratory: Denies SOB, DOE, cough, chest tightness, and wheezing.    Cardiovascular: Denies chest pain, palpitations and leg swelling.  Gastrointestinal: Denies nausea, vomiting, abdominal pain, diarrhea, constipation, blood in stool and abdominal distention.  Genitourinary: Denies dysuria, urgency, frequency, hematuria, flank pain and difficulty urinating.  Musculoskeletal: Denies joint swelling, gait problem.  Skin: Denies pallor, rash and wound.  Neurological: Denies dizziness, seizures, syncope, weakness, light-headedness, numbness and headaches.  Psychiatric/Behavioral: Denies suicidal ideation, mood changes, confusion, nervousness, sleep disturbance and agitation; continues to sleep well after she takes 0.25 mg Xanax before bed   Objective:  Physical Exam: Filed Vitals:   05/20/12 0839  BP: 129/67  Pulse: 90  Temp: 98.2 F (36.8 C)  TempSrc: Oral  Weight: 170 lb 14.4 oz (77.52 kg)  SpO2: 97%   Constitutional: Vital signs reviewed. Patient is a well-developed and well-nourished woman in no acute distress and cooperative with exam.  Mouth: no erythema or exudates, MMM  Eyes: PERRL, EOMI, conjunctivae normal, No scleral icterus.  Cardiovascular: RRR, S1 normal, S2 normal, no MRG, pulses symmetric and intact bilaterally  Pulmonary/Chest: CTAB, no wheezes, rales, or rhonchi  Abdominal: Soft. Non-tender, non-distended, bowel sounds are normal, no masses, organomegaly, or guarding present.  Musculoskeletal: No joint deformities, erythema, or stiffness, ROM full; mild tenderness to palpation of right coccyx  Neurological: A&O x3, Strength is normal and symmetric bilaterally, cranial nerve II-XII are grossly intact, no focal motor deficit, sensory intact to light touch bilaterally. Right thenar fasciculations without atrophy ; no intention or resting tremor; b/l LE DTR with hyperreflexia; able to do straight leg test with some minimal pain on hip flexion; normal gait Skin: Warm, dry and intact. No rash, cyanosis, or clubbing.  Psychiatric: Normal mood and  affect. speech and behavior is normal. Judgment and thought content normal. Cognition and memory are normal.    Assessment & Plan:  Case and care discussed with Dr.  Eben Burow Please see problem oriented charting for further details. Patient to return in 2 weeks of worsening or no improvement.

## 2012-05-20 NOTE — Assessment & Plan Note (Signed)
Improved with daily use of protonix.  Would like to continue conservative mgmt for now.

## 2012-05-20 NOTE — Assessment & Plan Note (Signed)
Improved mood. Continues to do well on Wellbutrin & zoloft

## 2012-06-02 DIAGNOSIS — G20A1 Parkinson's disease without dyskinesia, without mention of fluctuations: Secondary | ICD-10-CM

## 2012-06-02 DIAGNOSIS — G20 Parkinson's disease: Secondary | ICD-10-CM

## 2012-06-02 HISTORY — DX: Parkinson's disease: G20

## 2012-06-02 HISTORY — DX: Parkinson's disease without dyskinesia, without mention of fluctuations: G20.A1

## 2012-06-07 ENCOUNTER — Encounter: Payer: Self-pay | Admitting: Internal Medicine

## 2012-07-29 ENCOUNTER — Other Ambulatory Visit: Payer: Self-pay | Admitting: *Deleted

## 2012-07-29 DIAGNOSIS — K219 Gastro-esophageal reflux disease without esophagitis: Secondary | ICD-10-CM

## 2012-07-29 MED ORDER — PANTOPRAZOLE SODIUM 20 MG PO TBEC: 20.0000 mg | DELAYED_RELEASE_TABLET | Freq: Two times a day (BID) | ORAL | Status: DC

## 2012-10-05 DIAGNOSIS — G4752 REM sleep behavior disorder: Secondary | ICD-10-CM

## 2012-10-05 DIAGNOSIS — G20 Parkinson's disease: Secondary | ICD-10-CM | POA: Insufficient documentation

## 2012-10-05 DIAGNOSIS — R413 Other amnesia: Secondary | ICD-10-CM | POA: Insufficient documentation

## 2012-10-05 HISTORY — DX: REM sleep behavior disorder: G47.52

## 2012-11-12 ENCOUNTER — Ambulatory Visit (INDEPENDENT_AMBULATORY_CARE_PROVIDER_SITE_OTHER): Payer: Medicare Other | Admitting: Internal Medicine

## 2012-11-12 ENCOUNTER — Encounter: Payer: Self-pay | Admitting: Internal Medicine

## 2012-11-12 VITALS — BP 129/68 | HR 80 | Temp 97.9°F | Ht 60.5 in | Wt 171.4 lb

## 2012-11-12 DIAGNOSIS — R21 Rash and other nonspecific skin eruption: Secondary | ICD-10-CM

## 2012-11-12 MED ORDER — FLUOCINONIDE-E 0.05 % EX CREA: TOPICAL_CREAM | Freq: Two times a day (BID) | CUTANEOUS | Status: DC

## 2012-11-12 NOTE — Patient Instructions (Signed)
1. I am going to give you a cream that you can put on your rash. Put it on twice a day. If you are not getting better in the next couple of weeks, or you get worse with the cream, please call and come back to our clinic. 2. Call your neurologist to ask about any rash with ropinirole.

## 2012-11-12 NOTE — Assessment & Plan Note (Signed)
Patient has 2 scaly patches over her right ankle and behind right calf which resemble psoriatic plaques. 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 Ropinirole is associated with drug rash, and distribution of rash is more consistent with drug rash than a psoriasis. She has no systemic symptoms or involvement of her mucous membranes. The large scaly patches on her leg could also represent tinea corporis, although distribution on arms is not classic. -Will provide a trial of mid potency steroid with topical Lidex cream to affected areas. If symptoms get worse with steroid cream or do not improve, would next treat with topical antifungal. I instructed her to call us if she does not get better over the next week or 2. -Instructed her to call her neurologist ask about skin rash associated with ropinirole

## 2012-11-12 NOTE — Progress Notes (Signed)
Patient ID: Christina Stevenson, female   DOB: 06/27/1946, 67 y.o.   MRN: [id number]  Subjective:   Patient ID: Christina Stevenson female   DOB: 08-Jun-1946 67 y.o.   MRN: [id number]  HPI: [name] is a 67 y.o. female with history of parkinsons disease coming in for acute visit for rash.  About a month ago, she noticed pruritic erythematous patch develop over her right ankle and behind her right calf. She's tried to treat it with Gold Bond powder and lotions, but it has not gone away. Over the past couple weeks, she's also developed multiple pruritic erythematous papules over her bilateral arms with additional scattered lesions on her back and stomach. She denies any new medications, but says she recently increased her dose of ropinirole which she takes for Parkinson's disease. Denies any exposure to grass or weeds. Travel to the beach last week otherwise no recent travel. No new detergents. No pain or lesions in mouth eyes or genitals. No else at home with similar lesions. Denies any systemic symptoms such as fever, chills, nausea, vomiting, abdominal pain, changes in bowel or bladder habits.  Past Medical History  Diagnosis Date  . Hypertension   . Uterovaginal prolapse, incomplete     with cystocele  . Spinal stenosis in cervical region   . Hyperlipemia   . Allergic rhinitis, cause unspecified   . Irritable bowel syndrome   . Bundle branch block, unspecified   . GERD (gastroesophageal reflux disease)     on bid omeprazole  . Depression 08/2009    multiple stressors: including son who has suffered 2 CVA's and is in a rehab facility, financial difficulties, loss of pets, etc.  . Osteoarthrosis, unspecified whether generalized or localized, unspecified site   . Parkinson disease 06/02/12    Probable; being follwed by Dr. Anne Stevenson of [facility], has been started on Requip   Current Outpatient Prescriptions  Medication Sig Dispense Refill  . clonazePAM (KLONOPIN) 0.5 MG tablet Take 0.5 mg by  mouth at bedtime as needed for anxiety.      Marland Kitchen rOPINIRole (REQUIP) 1 MG tablet Take 1 mg by mouth 3 (three) times daily.      Marland Kitchen ALPRAZolam (XANAX) 0.5 MG tablet Take 0.5 tablets (0.25 mg total) by mouth at bedtime as needed.  30 tablet  0  . buPROPion (WELLBUTRIN XL) 150 MG 24 hr tablet Take 1 tablet (150 mg total) by mouth daily.  30 tablet  11  . cyclobenzaprine (FLEXERIL) 10 MG tablet Take 1 tablet (10 mg total) by mouth 2 (two) times daily as needed. For muscle spasm.  30 tablet  1  . fluocinonide-emollient (LIDEX-E) 0.05 % cream Apply topically 2 (two) times daily.  30 g  1  . ibuprofen (ADVIL) 200 MG tablet Take 4 tablets (800 mg total) by mouth 3 (three) times daily. For 7-14 days  100 tablet  2  . mometasone (NASONEX) 50 MCG/ACT nasal spray Place 1 spray into the nose daily.  17 g  3  . pantoprazole (PROTONIX) 20 MG tablet Take 1 tablet (20 mg total) by mouth 2 (two) times daily. Once before breakfast and the other before dinner.  60 tablet  5  . quinapril (ACCUPRIL) 20 MG tablet take 1 tablet by mouth once daily  30 tablet  5  . sertraline (ZOLOFT) 100 MG tablet Take 1 tablet (100 mg total) by mouth daily.  30 tablet  5  . triamterene-hydrochlorothiazide (MAXZIDE) 75-50 MG per tablet Take 1 tablet by  mouth daily. 1/4-1/2 tablet once a day as needed.  30 tablet  5   No current facility-administered medications for this visit.   No family history on file. History   Social History  . Marital Status: Married    Spouse Name: N/A    Number of Children: N/A  . Years of Education: N/A   Social History Main Topics  . Smoking status: Never Smoker   . Smokeless tobacco: None  . Alcohol Use: No  . Drug Use: No  . Sexually Active: None   Other Topics Concern  . None   Social History Narrative   Son Christina Stevenson suffered CVA x2, trach, now in rehab facility-2011   Regular exercise-yes   Review of Systems: 10 pt ROS performed, pertinent positives and negatives noted in HPI Objective:   Physical Exam: Filed Vitals:   11/12/12 1146  BP: 129/68  Pulse: 80  Temp: 97.9 F (36.6 C)  TempSrc: Oral  Height: 5' 0.5" (1.537 m)  Weight: 171 lb 6.4 oz (77.747 kg)  SpO2: 96%   Vitals reviewed. General: sitting in chair, NAD, resting tremor L arm Skin: 3cmx3cm scaling erythematous patch over dorsum of R ankle as well as on R upper calf. Multiple erythematous, excoriated papules covering bilateral arms with a few lesions on stomach and back. Sparing palms/soles. No involvement of MM  Assessment & Plan:   Please see problem-based charting for assessment and plan.

## 2012-11-16 ENCOUNTER — Telehealth: Payer: Self-pay | Admitting: *Deleted

## 2012-11-16 NOTE — Telephone Encounter (Signed)
Patient called stating she went to [facility] outpatient clinic on Friday because of rash and the physician thinks this rash may have come from ropinirole. Patient stated she was given a cream and it seems to be working. Pt. wants to speak w/physician.

## 2012-11-16 NOTE — Telephone Encounter (Signed)
I called patient. The patient indicates that she had a diffuse rash over her body. Her primary physician was concerned that this may be related to the Requip. The patient has been on this medication since October of 2013. I am not clear that this is the cause of the rash, but the rash is improving. The patient is to watch things over the next several weeks. If the rash keeps recurring, we may have to stop the Requip.

## 2012-11-23 ENCOUNTER — Encounter: Payer: Self-pay | Admitting: Internal Medicine

## 2012-11-23 DIAGNOSIS — R269 Unspecified abnormalities of gait and mobility: Secondary | ICD-10-CM | POA: Insufficient documentation

## 2013-01-04 ENCOUNTER — Other Ambulatory Visit: Payer: Self-pay | Admitting: *Deleted

## 2013-01-04 DIAGNOSIS — F329 Major depressive disorder, single episode, unspecified: Secondary | ICD-10-CM

## 2013-01-06 NOTE — Telephone Encounter (Signed)
There is a warning about combining sertraline and bupropion.  Has this been blessed by a psychiatrist?  I know nothing about it.  Please send this refill and this concern to Dr. Everardo Beals.

## 2013-01-12 MED ORDER — BUPROPION HCL ER (XL) 150 MG PO TB24: 150.0000 mg | ORAL_TABLET | Freq: Every day | ORAL | Status: DC

## 2013-01-12 MED ORDER — FLUOCINONIDE-E 0.05 % EX CREA: TOPICAL_CREAM | Freq: Two times a day (BID) | CUTANEOUS | Status: DC

## 2013-01-21 ENCOUNTER — Telehealth: Payer: Self-pay | Admitting: *Deleted

## 2013-01-21 MED ORDER — ALPRAZOLAM 1 MG PO TABS: 1.0000 mg | ORAL_TABLET | Freq: Every day | ORAL | Status: DC

## 2013-01-21 NOTE — Telephone Encounter (Signed)
I called the patient. The patient is having increasing problems with REM sleep disorder behavior. The patient will be taken off of clonazepam, and alprazolam will be used instead.
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
## 2013-02-08 ENCOUNTER — Encounter: Payer: Self-pay | Admitting: Neurology

## 2013-02-08 DIAGNOSIS — G20 Parkinson's disease: Secondary | ICD-10-CM

## 2013-02-08 DIAGNOSIS — G4752 REM sleep behavior disorder: Secondary | ICD-10-CM

## 2013-02-08 DIAGNOSIS — R413 Other amnesia: Secondary | ICD-10-CM

## 2013-02-11 ENCOUNTER — Other Ambulatory Visit: Payer: Self-pay | Admitting: *Deleted

## 2013-02-11 DIAGNOSIS — K219 Gastro-esophageal reflux disease without esophagitis: Secondary | ICD-10-CM

## 2013-02-14 MED ORDER — PANTOPRAZOLE SODIUM 20 MG PO TBEC: 20.0000 mg | DELAYED_RELEASE_TABLET | Freq: Two times a day (BID) | ORAL | Status: DC

## 2013-02-15 ENCOUNTER — Ambulatory Visit: Payer: Self-pay | Admitting: Neurology

## 2013-03-03 ENCOUNTER — Other Ambulatory Visit: Payer: Self-pay | Admitting: *Deleted

## 2013-03-05 MED ORDER — QUINAPRIL HCL 20 MG PO TABS: 20.0000 mg | ORAL_TABLET | Freq: Every day | ORAL | Status: DC

## 2013-03-16 ENCOUNTER — Encounter: Payer: Self-pay | Admitting: Neurology

## 2013-03-16 ENCOUNTER — Ambulatory Visit (INDEPENDENT_AMBULATORY_CARE_PROVIDER_SITE_OTHER): Payer: Medicare Other | Admitting: Neurology

## 2013-03-16 VITALS — BP 120/65 | HR 52 | Wt 166.0 lb

## 2013-03-16 DIAGNOSIS — G20A1 Parkinson's disease without dyskinesia, without mention of fluctuations: Secondary | ICD-10-CM

## 2013-03-16 DIAGNOSIS — R413 Other amnesia: Secondary | ICD-10-CM

## 2013-03-16 DIAGNOSIS — G20 Parkinson's disease: Secondary | ICD-10-CM

## 2013-03-16 DIAGNOSIS — G4752 REM sleep behavior disorder: Secondary | ICD-10-CM

## 2013-03-16 NOTE — Progress Notes (Signed)
Reason for visit: Parkinson's disease  Christina Stevenson is an 67 y.o. female  History of present illness:  Christina Stevenson is a 67 year old right-handed white female with a history of Parkinson's disease, gait disorder, memory disorder, and a REM sleep disorder. The patient is shuffling her feet some during the day, and she has tremors of both arms, with a true resting tremor. The patient was given a trial on Benadryl which was not very effective. The patient was placed on clonazepam for her REM sleep behavior disorder, but this was not effective. The patient has switched to alprazolam, and this has done well for her. The patient was having problems with sleepwalking, acting out dreams, yelling out in her sleep. This has improved significantly on the alprazolam. The patient has fallen on occasion, usually when she is shuffling her feet. The patient continues to note some problems with memory. The patient takes Flexeril intermittently for neck and back spasms, and this is usually a problem beginning in mid afternoon to the evening. The patient comes to this office for an evaluation. The patient is having increasing problems with reflux. The patient notes that she will choke on her own saliva frequently, and she will drool at nighttime.  Past Medical History  Diagnosis Date  . Hypertension   . Uterovaginal prolapse, incomplete     with cystocele  . Spinal stenosis in cervical region   . Hyperlipemia   . Allergic rhinitis, cause unspecified   . Irritable bowel syndrome   . Bundle branch block, unspecified   . GERD (gastroesophageal reflux disease)     on bid omeprazole  . Depression 08/2009    multiple stressors: including son who has suffered 2 CVA's and is in a rehab facility, financial difficulties, loss of pets, etc.  . Osteoarthrosis, unspecified whether generalized or localized, unspecified site   . Parkinson disease 06/02/12    Probable; being follwed by Dr. Anne Hahn of [facility], has been started  on Requip  . Obesity   . Sciatica of right side   . Depression   . Anxiety   . History of shingles   . Memory disturbance   . REM sleep behavior disorder     Past Surgical History  Procedure Laterality Date  . Abdominal hysterectomy    . Cervical laminectomy    . Lumbar laminectomy    . Trigger finger surgery Right     Thumb  . Bladder resuspension procedure      Family History  Problem Relation Age of Onset  . Cancer - Lung Sister     Social history:  reports that she has never smoked. She does not have any smokeless tobacco history on file. She reports that  drinks alcohol. She reports that she does not use illicit drugs.  Allergies:  Allergies  Allergen Reactions  . Prednisone     Swelling, emotionally volatile   . Latex Rash    Medications:  Current Outpatient Prescriptions on File Prior to Visit  Medication Sig Dispense Refill  . ALPRAZolam (XANAX) 1 MG tablet Take 1 tablet (1 mg total) by mouth at bedtime.  30 tablet  2  . buPROPion (WELLBUTRIN XL) 150 MG 24 hr tablet Take 1 tablet (150 mg total) by mouth daily.  30 tablet  11  . cyclobenzaprine (FLEXERIL) 10 MG tablet Take 1 tablet (10 mg total) by mouth 2 (two) times daily as needed. For muscle spasm.  30 tablet  1  . fluocinonide-emollient (LIDEX-E) 0.05 % cream Apply  topically 2 (two) times daily.  30 g  0  . ibuprofen (ADVIL) 200 MG tablet Take 4 tablets (800 mg total) by mouth 3 (three) times daily. For 7-14 days  100 tablet  2  . pantoprazole (PROTONIX) 20 MG tablet Take 1 tablet (20 mg total) by mouth 2 (two) times daily. Once before breakfast and the other before dinner.  60 tablet  2  . quinapril (ACCUPRIL) 20 MG tablet Take 1 tablet (20 mg total) by mouth daily.  90 tablet  0  . rOPINIRole (REQUIP) 1 MG tablet Take 1 mg by mouth 3 (three) times daily.      . sertraline (ZOLOFT) 100 MG tablet Take 1 tablet (100 mg total) by mouth daily.  30 tablet  5  . triamterene-hydrochlorothiazide (MAXZIDE) 75-50 MG  per tablet Take 1 tablet by mouth daily. 1/4-1/2 tablet once a day as needed.  30 tablet  5   No current facility-administered medications on file prior to visit.    ROS:  Out of a complete 14 system review of symptoms, the patient complains only of the following symptoms, and all other reviewed systems are negative.  Fatigue Heart murmur Difficulty swallowing Skin rash Cough, snoring Incontinence of bowel Feeling hot, flushing Allergies, runny nose, skin sensitivity Memory loss, confusion, headache, weakness Dizziness, tremor Depression, anxiety Sleepiness, snoring  Blood pressure 120/65, pulse 52, weight 166 lb (75.297 kg).  Physical Exam  General: The patient is alert and cooperative at the time of the examination. The patient is moderately obese.  Skin: No significant peripheral edema is noted.   Neurologic Exam  Mental status: Mini-Mental status examination done today shows a total score of 30 out of 30. The patient is able to name 12 animals in 60 seconds.  Cranial nerves: Facial symmetry is present. Speech is normal, no aphasia or dysarthria is noted. Extraocular movements are full. Visual fields are full. Minimal masking of the face is seen.  Motor: The patient has good strength in all 4 extremities.  Coordination: The patient has good finger-nose-finger and heel-to-shin bilaterally. The patient has resting tremors on both upper extremities.  Gait and station: The patient has a normal gait. The patient demonstrates excellent stride, arm swing, but a tremor is noted in the right upper extremity with walking. Tandem gait is normal. Romberg is negative. No drift is seen. The patient is able to arise from a seated position with arms crossed.  Reflexes: Deep tendon reflexes are symmetric, but are somewhat brisk throughout.   Assessment/Plan:  1. Parkinson's disease  2. REM sleep behavior disorder  3. Gait disorder  4. Memory disorder  5. Gastroesophageal  reflux disease  The patient will be maintained on her current dose of Requip. The patient appears to ambulate quite well today, but she will need to maintain a good level of exercise. The patient will followup through this office in about 6 months. The patient is having increasing problems with reflux, and her primary care physician will need to address this.  Marlan Palau MD 03/16/2013 8:21 PM  [facility] [address] [place], [place] [address]  Phone [phone] Fax [phone]

## 2013-04-04 ENCOUNTER — Other Ambulatory Visit: Payer: Self-pay | Admitting: Neurology

## 2013-04-06 ENCOUNTER — Ambulatory Visit (INDEPENDENT_AMBULATORY_CARE_PROVIDER_SITE_OTHER): Payer: Medicare Other | Admitting: Internal Medicine

## 2013-04-06 ENCOUNTER — Encounter: Payer: Self-pay | Admitting: Internal Medicine

## 2013-04-06 VITALS — BP 122/71 | HR 81 | Temp 97.2°F | Wt 165.2 lb

## 2013-04-06 DIAGNOSIS — Z13820 Encounter for screening for osteoporosis: Secondary | ICD-10-CM

## 2013-04-06 DIAGNOSIS — I10 Essential (primary) hypertension: Secondary | ICD-10-CM

## 2013-04-06 DIAGNOSIS — E785 Hyperlipidemia, unspecified: Secondary | ICD-10-CM

## 2013-04-06 DIAGNOSIS — F3289 Other specified depressive episodes: Secondary | ICD-10-CM

## 2013-04-06 DIAGNOSIS — Z1239 Encounter for other screening for malignant neoplasm of breast: Secondary | ICD-10-CM

## 2013-04-06 DIAGNOSIS — Z23 Encounter for immunization: Secondary | ICD-10-CM

## 2013-04-06 DIAGNOSIS — G20A1 Parkinson's disease without dyskinesia, without mention of fluctuations: Secondary | ICD-10-CM

## 2013-04-06 DIAGNOSIS — G20 Parkinson's disease: Secondary | ICD-10-CM

## 2013-04-06 DIAGNOSIS — Z0000 Encounter for general adult medical examination without abnormal findings: Secondary | ICD-10-CM

## 2013-04-06 DIAGNOSIS — K219 Gastro-esophageal reflux disease without esophagitis: Secondary | ICD-10-CM

## 2013-04-06 DIAGNOSIS — F329 Major depressive disorder, single episode, unspecified: Secondary | ICD-10-CM

## 2013-04-06 LAB — BASIC METABOLIC PANEL
CO2: 28 mEq/L (ref 19–32)
Calcium: 10.2 mg/dL (ref 8.4–10.5)
Chloride: 104 mEq/L (ref 96–112)
Creat: 1.12 mg/dL — ABNORMAL HIGH (ref 0.50–1.10)
Glucose, Bld: 102 mg/dL — ABNORMAL HIGH (ref 70–99)

## 2013-04-06 LAB — LIPID PANEL
LDL Cholesterol: 131 mg/dL — ABNORMAL HIGH (ref 0–99)
Total CHOL/HDL Ratio: 4 Ratio
Triglycerides: 113 mg/dL (ref <150)
VLDL: 23 mg/dL (ref 0–40)

## 2013-04-06 LAB — MAGNESIUM: Magnesium: 1.9 mg/dL (ref 1.5–2.5)

## 2013-04-06 NOTE — Assessment & Plan Note (Signed)
Lipid profile today 

## 2013-04-06 NOTE — Assessment & Plan Note (Addendum)
Referral for DEXA and Mammo today.  Have suggested that she follow up with her eye doctor for routine.

## 2013-04-06 NOTE — Assessment & Plan Note (Signed)
Patient reports mood is stable on wellbutrin & zoloft, but began crying during depression screen.  Visit in 3 months will be devoted to depression and cognitive testing.

## 2013-04-06 NOTE — Patient Instructions (Signed)
-  Today we are checking some blood work to keep an eye on your electrolytes, kidneys, cholesterol and vitamin D level.  -I am referring you to have your mammogram and bone density scan done  -Please schedule a routine eye exam with your eye doctor  -Continue ibuprofen for now for your neck pain  -Today we are going to give you your tetanus shot  -Next time, we will talk more about your mood and reflux symptoms  Please be sure to bring all of your medications with you to every visit.  Should you have any new or worsening symptoms, please be sure to call the clinic at [phone].

## 2013-04-06 NOTE — Progress Notes (Signed)
Subjective:   Patient ID: Christina Stevenson female   DOB: Sep 26, 1945 67 y.o.   MRN: [id number]  HPI: [name] is a 67 y.o. woman with history of hypertension, cervical spinal stenosis, reflux disease with hiatal hernia, and arthritis who presents today for routine follow up.  She reports occasional left neck and back tightening in the evening, better with advil, heating pad also helps; flexiril not helpful (flexiril if neck "grabs" - but this is usually the right side)  Bothered with reflux once in a while, better with tums, better with cold foods; no weight loss, no night sweats/chills; 2x/week, before bed, eats 5-7p, goes to bed around 10p  Review of Systems: Constitutional: Denies fever, chills, diaphoresis, appetite change  Respiratory: Denies SOB, DOE, cough, chest tightness, and wheezing.  Cardiovascular: Denies chest pain, palpitations and leg swelling.  Gastrointestinal: Denies nausea, vomiting, abdominal pain, diarrhea, constipation,blood in stool and abdominal distention.  Genitourinary: Denies dysuria, urgency, frequency, hematuria, flank pain and difficulty urinating.  Musculoskeletal: Denies joint swelling, arthralgias  Neurological: Denies dizziness, seizures, syncope, lightheadedness, numbness.    Past Medical History  Diagnosis Date  . Hypertension   . Uterovaginal prolapse, incomplete     with cystocele  . Spinal stenosis in cervical region   . Hyperlipemia   . Allergic rhinitis, cause unspecified   . Irritable bowel syndrome   . Bundle branch block, unspecified   . GERD (gastroesophageal reflux disease)     on bid omeprazole  . Depression 08/2009    multiple stressors: including son who has suffered 2 CVA's and is in a rehab facility, financial difficulties, loss of pets, etc.  . Osteoarthrosis, unspecified whether generalized or localized, unspecified site   . Parkinson disease 06/02/12    Probable; being follwed by Dr. Anne Hahn of [facility], has been  started on Requip  . Obesity   . Sciatica of right side   . Depression   . Anxiety   . History of shingles   . Memory disturbance   . REM sleep behavior disorder    Current Outpatient Prescriptions  Medication Sig Dispense Refill  . ALPRAZolam (XANAX) 1 MG tablet Take 1 tablet (1 mg total) by mouth at bedtime.  30 tablet  2  . aspirin 81 MG tablet Take 81 mg by mouth daily.      Marland Kitchen buPROPion (WELLBUTRIN XL) 150 MG 24 hr tablet Take 1 tablet (150 mg total) by mouth daily.  30 tablet  11  . calcium citrate-vitamin D (CITRACAL+D) 315-200 MG-UNIT per tablet Take 1 tablet by mouth daily.      . cyclobenzaprine (FLEXERIL) 10 MG tablet Take 1 tablet (10 mg total) by mouth 2 (two) times daily as needed. For muscle spasm.  30 tablet  1  . fexofenadine (ALLEGRA) 180 MG tablet Take 180 mg by mouth daily.      . fluocinonide-emollient (LIDEX-E) 0.05 % cream Apply topically 2 (two) times daily.  30 g  0  . ibuprofen (ADVIL) 200 MG tablet Take 4 tablets (800 mg total) by mouth 3 (three) times daily. For 7-14 days  100 tablet  2  . pantoprazole (PROTONIX) 20 MG tablet Take 1 tablet (20 mg total) by mouth 2 (two) times daily. Once before breakfast and the other before dinner.  60 tablet  2  . quinapril (ACCUPRIL) 20 MG tablet Take 1 tablet (20 mg total) by mouth daily.  90 tablet  0  . rOPINIRole (REQUIP) 1 MG tablet take 1 tablet by  mouth three times a day  90 tablet  3  . sertraline (ZOLOFT) 100 MG tablet Take 1 tablet (100 mg total) by mouth daily.  30 tablet  5  . triamterene-hydrochlorothiazide (MAXZIDE) 75-50 MG per tablet Take 1 tablet by mouth daily. 1/4-1/2 tablet once a day as needed.  30 tablet  5   No current facility-administered medications for this visit.   Family History  Problem Relation Age of Onset  . Cancer - Lung Sister    History   Social History  . Marital Status: Married    Spouse Name: N/A    Number of Children: 2  . Years of Education: 71   Social History Main Topics    . Smoking status: Never Smoker   . Smokeless tobacco: Not on file  . Alcohol Use: Yes     Comment: Occasional  . Drug Use: No  . Sexual Activity: Not on file   Other Topics Concern  . Not on file   Social History Narrative   Son Christina Stevenson suffered CVA x2, trach, now in rehab facility-2011   Regular exercise-yes    Objective:  Physical Exam: Filed Vitals:   04/06/13 1418  BP: 122/71  Pulse: 81  Temp: 97.2 F (36.2 C)  TempSrc: Oral   General: appears as stated age, well groomed HEENT: PERRL, EOMI, no scleral icterus, no thyromegaly, surgical scar on left neck Cardiac: RRR, no rubs, murmurs or gallops Pulm: clear to auscultation bilaterally, moving normal volumes of air Abd: soft, nontender, nondistended, BS normoactive Ext: warm and well perfused, no pedal edema Neuro: alert and oriented X3, cranial nerves II-XII grossly intact, b/l hand fasciculations, 4+/5 b/l hand grip strength  Assessment & Plan:  Case and care discussed with Dr. Rogelia Boga.  Please see problem oriented charting for further details. Patient to return in 3 months for depression/cognitive & GERD follow up.

## 2013-04-06 NOTE — Assessment & Plan Note (Signed)
Patient continues to defer referral to GI, but she is without weightloss/night sweats so I am less concerned.  I have asked to to continue monitoring, and we will discuss in more detail at next visit in 3 months.

## 2013-04-06 NOTE — Assessment & Plan Note (Signed)
BP Readings from Last 3 Encounters:  04/06/13 122/71  03/16/13 120/65  10/05/12 136/80    Lab Results  Component Value Date   NA 139 12/24/2011   K 4.0 12/24/2011   CREATININE 0.91 12/24/2011    Assessment: Blood pressure control:  well controlled Progress toward BP goal:   at gaol  Plan: Medications:  Patient was only occasionally taking Maxide because of increased urination; takes quinapril daily (will check BMET today); will d/x Maxide as BP well controlled  Educational resources provided: brochure Self management tools provided: home blood pressure logbook

## 2013-04-07 NOTE — Progress Notes (Signed)
Case discussed with Dr. Sharda at the time of the visit.  We reviewed the resident's history and exam and pertinent patient test results.  I agree with the assessment, diagnosis, and plan of care documented in the resident's note.    

## 2013-04-22 ENCOUNTER — Ambulatory Visit (HOSPITAL_COMMUNITY)
Admission: RE | Admit: 2013-04-22 | Discharge: 2013-04-22 | Disposition: A | Discharge status: Home / Self Care | Payer: Medicare Other | Source: Ambulatory Visit | Attending: Internal Medicine | Admitting: Internal Medicine

## 2013-04-22 ENCOUNTER — Ambulatory Visit (HOSPITAL_COMMUNITY): Payer: Medicare Other

## 2013-04-22 DIAGNOSIS — Z780 Asymptomatic menopausal state: Secondary | ICD-10-CM | POA: Insufficient documentation

## 2013-04-22 DIAGNOSIS — Z13820 Encounter for screening for osteoporosis: Secondary | ICD-10-CM | POA: Insufficient documentation

## 2013-04-26 ENCOUNTER — Other Ambulatory Visit: Payer: Self-pay | Admitting: Neurology

## 2013-04-26 NOTE — Telephone Encounter (Signed)
Rx signed and faxed.

## 2013-05-16 ENCOUNTER — Telehealth: Payer: Self-pay | Admitting: Neurology

## 2013-05-16 NOTE — Telephone Encounter (Signed)
The patient has Parkinson's disease and a reported memory problem. Both of these issues are relatively mild, but it may be reasonable to keep her from doing jury duty anyway. I'll write a letter.

## 2013-05-16 NOTE — Telephone Encounter (Signed)
Spoke to patient and she is requesting a letter to excuse her from jury duty, scheduled on 06-02-13.  She will mail in the summons, with details needed to be excused.

## 2013-05-17 ENCOUNTER — Telehealth: Payer: Self-pay | Admitting: *Deleted

## 2013-05-18 ENCOUNTER — Other Ambulatory Visit: Payer: Self-pay | Admitting: Internal Medicine

## 2013-06-23 ENCOUNTER — Other Ambulatory Visit: Payer: Self-pay

## 2013-07-04 NOTE — Telephone Encounter (Signed)
Jury duty letter ready.

## 2013-07-06 ENCOUNTER — Encounter: Payer: Self-pay | Admitting: Internal Medicine

## 2013-07-06 ENCOUNTER — Ambulatory Visit (INDEPENDENT_AMBULATORY_CARE_PROVIDER_SITE_OTHER): Payer: Medicare Other | Admitting: Internal Medicine

## 2013-07-06 VITALS — BP 140/75 | HR 89 | Temp 98.4°F | Wt 165.0 lb

## 2013-07-06 DIAGNOSIS — N644 Mastodynia: Secondary | ICD-10-CM | POA: Insufficient documentation

## 2013-07-06 DIAGNOSIS — R1310 Dysphagia, unspecified: Secondary | ICD-10-CM

## 2013-07-06 DIAGNOSIS — F329 Major depressive disorder, single episode, unspecified: Secondary | ICD-10-CM

## 2013-07-06 DIAGNOSIS — Z23 Encounter for immunization: Secondary | ICD-10-CM

## 2013-07-06 DIAGNOSIS — R21 Rash and other nonspecific skin eruption: Secondary | ICD-10-CM

## 2013-07-06 DIAGNOSIS — Z0000 Encounter for general adult medical examination without abnormal findings: Secondary | ICD-10-CM

## 2013-07-06 DIAGNOSIS — K219 Gastro-esophageal reflux disease without esophagitis: Secondary | ICD-10-CM

## 2013-07-06 DIAGNOSIS — I10 Essential (primary) hypertension: Secondary | ICD-10-CM

## 2013-07-06 DIAGNOSIS — E785 Hyperlipidemia, unspecified: Secondary | ICD-10-CM

## 2013-07-06 NOTE — Assessment & Plan Note (Signed)
Today she reports she has only been taking one psychotropic med, presumably wellbutrin; she cannot recall zoloft, but she did endorse taking that medication in 03/2013.  MMSE at last neuro visit was 30/30. We will need to re-address this at next visit, as I have asked her to bring her medications with her.

## 2013-07-06 NOTE — Assessment & Plan Note (Signed)
Patient does have some coughing with saliva - this is likely a sequela of her parkinson's. Consider SLP eval in the future.

## 2013-07-06 NOTE — Progress Notes (Signed)
Subjective:   Patient ID: Christina Stevenson female   DOB: 09-17-1945 67 y.o.   MRN: [id number]  HPI: [name] is a 67 y.o. woman with history of hypertension, cervical spinal stenosis, reflux disease with hiatal hernia, and arthritis who presents today for routine follow up.   Regarding reflux, she reports this is much improved, but occasionally is exacerbated at night.  She reports occasional, intermittent, left breast "ache" that resolves on its own before she can even try anything.  Worse with walking.   Continues to have episodes of crying and occasional mood swings. She cannot recall if she is taking both wellbutrin & zoloft. She is being treated with alprazolam for REM sleep disturbance by Dr. Anne Hahn.   Finally, she reports a persistent rash that she has been seen in clinic about, that is itchy, and improves with lidex cream, but does not resolve.  Not worsening, but worried that she is only masking something.  She uses dove for sensitive skin and laundry detergent for sensitive skin - no recent changes in either of these.  Flu shot today  Review of Systems: Constitutional: Denies fever, chills, diaphoresis, appetite change  HEENT: Denies photophobia, eye pain, redness, hearing loss, ear pain, congestion, sore throat, rhinorrhea, sneezing, mouth sores, trouble swallowing, neck pain, neck stiffness and tinnitus.  Respiratory: Denies SOB, DOE, cough and wheezing.  Cardiovascular: Denies chest pain, palpitations and leg swelling.  Gastrointestinal: Denies nausea, vomiting, abdominal pain, diarrhea, constipation,blood in stool and abdominal distention.  Genitourinary: Denies dysuria, urgency, frequency, hematuria, flank pain and difficulty urinating.  Skin: as above Neurological: Denies dizziness, seizures, syncope, weakness, lightheadedness, numbness and headaches.   Past Medical History  Diagnosis Date  . Hypertension   . Uterovaginal prolapse, incomplete     with  cystocele  . Spinal stenosis in cervical region   . Hyperlipemia   . Allergic rhinitis, cause unspecified   . Irritable bowel syndrome   . Bundle branch block, unspecified   . GERD (gastroesophageal reflux disease)     on bid omeprazole  . Depression 08/2009    multiple stressors: including son who has suffered 2 CVA's and is in a rehab facility, financial difficulties, loss of pets, etc.  . Osteoarthrosis, unspecified whether generalized or localized, unspecified site   . Parkinson disease 06/02/12    Probable; being follwed by Dr. Anne Hahn of [facility], has been started on Requip  . Obesity   . Sciatica of right side   . Depression   . Anxiety   . History of shingles   . Memory disturbance   . REM sleep behavior disorder   . HERPES ZOSTER 09/11/2009    Qualifier: Diagnosis of  By: Coralee Pesa MD, Levada Schilling    Current Outpatient Prescriptions  Medication Sig Dispense Refill  . ALPRAZolam (XANAX) 1 MG tablet take 1 tablet by mouth at bedtime  30 tablet  5  . aspirin 81 MG tablet Take 81 mg by mouth daily.      Marland Kitchen buPROPion (WELLBUTRIN XL) 150 MG 24 hr tablet Take 1 tablet (150 mg total) by mouth daily.  30 tablet  11  . calcium citrate-vitamin D (CITRACAL+D) 315-200 MG-UNIT per tablet Take 1 tablet by mouth daily.      . cyclobenzaprine (FLEXERIL) 10 MG tablet Take 1 tablet (10 mg total) by mouth 2 (two) times daily as needed. For muscle spasm.  30 tablet  1  . fexofenadine (ALLEGRA) 180 MG tablet Take 180 mg by mouth daily.      Marland Kitchen  fluocinonide-emollient (LIDEX-E) 0.05 % cream apply topically to affected area twice a day  30 g  0  . pantoprazole (PROTONIX) 20 MG tablet take 1 tablet by mouth twice a day before BREKFAST AND DINNER  60 tablet  2  . quinapril (ACCUPRIL) 20 MG tablet Take 1 tablet (20 mg total) by mouth daily.  90 tablet  0  . rOPINIRole (REQUIP) 1 MG tablet take 1 tablet by mouth three times a day  90 tablet  3  . sertraline (ZOLOFT) 100 MG tablet Take 1 tablet (100 mg total) by  mouth daily.  30 tablet  5   No current facility-administered medications for this visit.   Family History  Problem Relation Age of Onset  . Cancer - Lung Sister    History   Social History  . Marital Status: Married    Spouse Name: N/A    Number of Children: 2  . Years of Education: 65   Social History Main Topics  . Smoking status: Never Smoker   . Smokeless tobacco: None  . Alcohol Use: Yes     Comment: Occasional  . Drug Use: No  . Sexual Activity: None   Other Topics Concern  . None   Social History Narrative   Son Thayer Ohm suffered CVA x2, trach, now in rehab facility-2011   Regular exercise-yes    Objective:  Physical Exam: Filed Vitals:   07/06/13 1512  BP: 140/75  Pulse: 89  Temp: 98.4 F (36.9 C)  TempSrc: Oral  Weight: 165 lb (74.844 kg)  SpO2: 98%   General: appears as stated age HEENT: PERRL, EOMI, no scleral icterus Cardiac: RRR, no rubs, murmurs or gallops Pulm: clear to auscultation bilaterally, moving normal volumes of air Abd: soft, nontender, nondistended, BS normoactive Breast: left breast without tenderness, erythema or rash, no palpable mass Ext: warm and well perfused, trace pedal edema Skin: pinpoint 2-3 mm scaly rash on b/l hands, 3-34mm on b/l posterior calves Neuro: alert and oriented X3, cranial nerves II-XII grossly intact, resting tremor b/l  Assessment & Plan:  Case and care discussed with Dr. Rogelia Boga.  Please see problem oriented charting for further details. Patient to return in 6 weeks for depression followup.

## 2013-07-06 NOTE — Assessment & Plan Note (Signed)
Seemed to improve mildly with lidex. 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  Patient continues to be concerned about rash since it hasn't resolved.  It doesn't appear to be fungal.  Drug rash due to ropinirole has been considered, but less likely.  Will refer to dermatology.

## 2013-07-06 NOTE — Assessment & Plan Note (Signed)
BP Readings from Last 3 Encounters:  07/06/13 140/75  04/06/13 122/71  03/16/13 120/65    Lab Results  Component Value Date   NA 140 04/06/2013   K 4.3 04/06/2013   CREATININE 1.12* 04/06/2013    Assessment: Blood pressure control:  controlled Progress toward BP goal:   at goal  Plan: Medications:  continue current medications - quinapril 20 mg daily

## 2013-07-06 NOTE — Assessment & Plan Note (Signed)
Improved, she declines GI referral. Stable weight without night sweats, and without dysphagia with foods/liquids.

## 2013-07-06 NOTE — Patient Instructions (Addendum)
-  Please have mammogram done.  You got your Flu shot today.  -We are referring you to dermatology for your skin rash  -Next time, we will talk more about your mood  Please be sure to bring all of your medications with you to every visit.  Should you have any new or worsening symptoms, please be sure to call the clinic at [phone].

## 2013-07-06 NOTE — Assessment & Plan Note (Signed)
Flu shot today. Mammo scheduled. DEXA - no osteoporosis.

## 2013-07-06 NOTE — Assessment & Plan Note (Signed)
LDL 131, with 2 risk factors (age, HTN) and 6% 10 year risk, suggest LDL at which to consider statin therapy is >160.  No statin therapy at present.

## 2013-07-06 NOTE — Assessment & Plan Note (Addendum)
Unclear what this is.  It seems to be too intermittent and sporadic to be able to treat.  ?spasm.  Will continue to monitor, and have mammo scheduled.  Given that this is worse with walking, though this does not sound cardiac related, if this persists, may need to consider a cardiac work up with possible stress test.

## 2013-07-07 NOTE — Progress Notes (Signed)
Case discussed with Dr. Sharda soon after the resident saw the patient.  We reviewed the resident's history and exam and pertinent patient test results.  I agree with the assessment, diagnosis, and plan of care documented in the resident's note. 

## 2013-08-01 ENCOUNTER — Other Ambulatory Visit: Payer: Self-pay | Admitting: Neurology

## 2013-08-12 ENCOUNTER — Other Ambulatory Visit: Payer: Self-pay | Admitting: Neurology

## 2013-08-21 ENCOUNTER — Other Ambulatory Visit: Payer: Self-pay | Admitting: Internal Medicine

## 2013-10-03 ENCOUNTER — Telehealth: Payer: Self-pay | Admitting: Neurology

## 2013-10-04 ENCOUNTER — Ambulatory Visit: Payer: Medicare Other | Admitting: Neurology

## 2013-10-22 ENCOUNTER — Other Ambulatory Visit: Payer: Self-pay | Admitting: Internal Medicine

## 2013-10-22 ENCOUNTER — Other Ambulatory Visit: Payer: Self-pay | Admitting: Neurology

## 2013-10-24 NOTE — Telephone Encounter (Signed)
Rx signed and faxed.

## 2013-10-24 NOTE — Telephone Encounter (Signed)
Rx changed from Klonopin to Xanax per notes on 06/06

## 2013-10-27 ENCOUNTER — Ambulatory Visit (INDEPENDENT_AMBULATORY_CARE_PROVIDER_SITE_OTHER): Payer: Medicare Other | Admitting: Neurology

## 2013-10-27 ENCOUNTER — Encounter: Payer: Self-pay | Admitting: Neurology

## 2013-10-27 VITALS — BP 130/71 | HR 92 | Wt 163.0 lb

## 2013-10-27 DIAGNOSIS — G4752 REM sleep behavior disorder: Secondary | ICD-10-CM

## 2013-10-27 DIAGNOSIS — G20 Parkinson's disease: Secondary | ICD-10-CM

## 2013-10-27 DIAGNOSIS — R413 Other amnesia: Secondary | ICD-10-CM

## 2013-10-27 DIAGNOSIS — G20A1 Parkinson's disease without dyskinesia, without mention of fluctuations: Secondary | ICD-10-CM

## 2013-10-27 MED ORDER — CYCLOBENZAPRINE HCL 10 MG PO TABS: 10.0000 mg | ORAL_TABLET | Freq: Three times a day (TID) | ORAL | Status: DC | PRN

## 2013-10-27 NOTE — Patient Instructions (Signed)

## 2013-10-27 NOTE — Progress Notes (Signed)
Reason for visit: Parkinson's disease  Christina Stevenson is an 68 y.o. female  History of present illness:  Christina Stevenson is a 68 year old right-handed white female with a history of Parkinson's disease and a mild memory disturbance. The patient also has a REM sleep behavior disorder. The patient indicates that she has not been very active, and she does not exercise on a regular basis. The patient has reported some issues with swallowing at times, and she will choke on fluids and solids. The patient has had a recent fall when she was trying to put on stockings while standing up. The patient in general has good stability with walking otherwise. The patient does have some mild memory issues, and she has difficulty with math function. The patient recently has developed a trigger finger involving the middle finger on the right hand. The patient has had surgery previously on the thumb for the same reason. The patient returns to this office for an evaluation. The patient has not noted any other new medical issues that have come up since last seen.  Past Medical History  Diagnosis Date  . Hypertension   . Uterovaginal prolapse, incomplete     with cystocele  . Spinal stenosis in cervical region   . Hyperlipemia   . Allergic rhinitis, cause unspecified   . Irritable bowel syndrome   . Bundle branch block, unspecified   . GERD (gastroesophageal reflux disease)     on bid omeprazole  . Depression 08/2009    multiple stressors: including son who has suffered 2 CVA's and is in a rehab facility, financial difficulties, loss of pets, etc.  . Osteoarthrosis, unspecified whether generalized or localized, unspecified site   . Parkinson disease 06/02/12    Probable; being follwed by Dr. Anne Hahn of [facility], has been started on Requip  . Obesity   . Sciatica of right side   . Depression   . Anxiety   . History of shingles   . Memory disturbance   . REM sleep behavior disorder   . HERPES ZOSTER 09/11/2009   Qualifier: Diagnosis of  By: Coralee Pesa MD, Levada Schilling     Past Surgical History  Procedure Laterality Date  . Abdominal hysterectomy    . Cervical laminectomy    . Lumbar laminectomy    . Trigger finger surgery Right     Thumb  . Bladder resuspension procedure      Family History  Problem Relation Age of Onset  . Cancer - Lung Sister   . Heart Problems Father     lived to be 40 yo    Social history:  reports that she has never smoked. She has never used smokeless tobacco. She reports that she drinks alcohol. She reports that she does not use illicit drugs.    Allergies  Allergen Reactions  . Prednisone     Swelling, emotionally volatile   . Latex Rash    Medications:  Current Outpatient Prescriptions on File Prior to Visit  Medication Sig Dispense Refill  . ALPRAZolam (XANAX) 1 MG tablet take 1 tablet by mouth at bedtime  30 tablet  5  . aspirin 81 MG tablet Take 81 mg by mouth daily.      Marland Kitchen buPROPion (WELLBUTRIN XL) 150 MG 24 hr tablet Take 1 tablet (150 mg total) by mouth daily.  30 tablet  11  . calcium citrate-vitamin D (CITRACAL+D) 315-200 MG-UNIT per tablet Take 1 tablet by mouth daily.      . fexofenadine (ALLEGRA)  180 MG tablet Take 180 mg by mouth daily.      . fluocinonide-emollient (LIDEX-E) 0.05 % cream apply topically to affected area twice a day  30 g  0  . pantoprazole (PROTONIX) 20 MG tablet take 1 tablet twice a day BEFORE BREAKFAST AND DINNER.  60 tablet  5  . quinapril (ACCUPRIL) 20 MG tablet take 1 tablet by mouth once daily  90 tablet  0  . rOPINIRole (REQUIP) 1 MG tablet take 1 tablet by mouth three times a day  90 tablet  3  . sertraline (ZOLOFT) 100 MG tablet Take 1 tablet (100 mg total) by mouth daily.  30 tablet  5   No current facility-administered medications on file prior to visit.    ROS:  Out of a complete 14 system review of symptoms, the patient complains only of the following symptoms, and all other reviewed systems are negative.  Neck  pain, neck stiffness, difficulty swallowing, drooling Eye itching, eye redness Choking Heart murmur Flushing Daytime drowsiness, snoring, acting out dreams Environmental allergies, food allergies Back pain, walking difficulties, coordination problems Memory loss, headache Depression, anxiety  Blood pressure 130/71, pulse 92, weight 163 lb (73.936 kg).  Physical Exam  General: The patient is alert and cooperative at the time of the examination.  Skin: No significant peripheral edema is noted.   Neurologic Exam  Mental status: The Mini-Mental status examination done today shows a total score of 29/30.  Cranial nerves: Facial symmetry is present. Speech is normal, no aphasia or dysarthria is noted. Extraocular movements are full. Visual fields are full. Mild masking of the face is seen.  Motor: The patient has good strength in all 4 extremities.  Sensory examination: Soft touch sensation is symmetric on the face, arms, and legs.  Coordination: The patient has good finger-nose-finger and heel-to-shin bilaterally. A resting tremor is noted with the left upper extremity.  Gait and station: The patient is able to arise from a seated position with arms crossed. The patient has a normal gait. Good arm swing is seen bilaterally. The patient does not require a cane or walker for ambulation. Tandem gait is normal. Romberg is negative. No drift is seen.  Reflexes: Deep tendon reflexes are symmetric.   Assessment/Plan:  One. Parkinson's disease  2. Mild memory disturbance  3. REM sleep behavior disorder  The patient overall is doing well with her mobility. The patient needs to exercise on a regular basis. The patient will continue her medications for Parkinson's disease without change. The patient is on Requip 1 mg 3 times daily. The patient will followup through this office in 5 months. The patient takes alprazolam 1 mg at night for the REM sleep behavior disorder. A prescription was  given for Flexeril for her cervical spine discomfort.  Marlan Palau. Keith Willis MD 10/27/2013 7:13 PM  [facility] [address] [place], [place] [id number]  Phone [phone] Fax [phone]

## 2013-12-12 ENCOUNTER — Other Ambulatory Visit: Payer: Self-pay | Admitting: Neurology

## 2014-01-16 ENCOUNTER — Other Ambulatory Visit: Payer: Self-pay | Admitting: Internal Medicine

## 2014-01-17 NOTE — Telephone Encounter (Signed)
Please schedule an appointment in [facility] within 1 month, and have patient bring all of her medication bottles so her medications can be reviewed.

## 2014-01-20 ENCOUNTER — Ambulatory Visit (HOSPITAL_COMMUNITY)
Admission: RE | Admit: 2014-01-20 | Discharge: 2014-01-20 | Disposition: A | Discharge status: Home / Self Care | Payer: Medicare Other | Source: Ambulatory Visit | Attending: Internal Medicine | Admitting: Internal Medicine

## 2014-01-20 ENCOUNTER — Other Ambulatory Visit: Payer: Self-pay | Admitting: Internal Medicine

## 2014-01-20 DIAGNOSIS — Z1231 Encounter for screening mammogram for malignant neoplasm of breast: Secondary | ICD-10-CM | POA: Insufficient documentation

## 2014-01-20 DIAGNOSIS — Z1239 Encounter for other screening for malignant neoplasm of breast: Secondary | ICD-10-CM

## 2014-02-08 ENCOUNTER — Other Ambulatory Visit: Payer: Self-pay | Admitting: Internal Medicine

## 2014-02-08 NOTE — Telephone Encounter (Signed)
pls sch appt with new PCP in next 90 days

## 2014-02-08 NOTE — Telephone Encounter (Signed)
Message sent to front desk pool regarding appt per Dr Butcher. 

## 2014-02-14 ENCOUNTER — Other Ambulatory Visit: Payer: Self-pay | Admitting: Internal Medicine

## 2014-02-14 NOTE — Telephone Encounter (Signed)
Closing encounter

## 2014-02-14 NOTE — Telephone Encounter (Signed)
[facility] to  follow-up depression and other medical problems.  Please have patient bring all of her medications to that visit.

## 2014-03-20 ENCOUNTER — Other Ambulatory Visit: Payer: Self-pay | Admitting: Internal Medicine

## 2014-03-20 NOTE — Telephone Encounter (Signed)
Wellbutrin just filled 6/30. She needs to keep her appt this month as she has two depression meds on her list and at last appt, she didn't know which one she was taking. Needs to bring in all med bottles.

## 2014-03-29 ENCOUNTER — Encounter: Payer: Self-pay | Admitting: Internal Medicine

## 2014-03-29 ENCOUNTER — Ambulatory Visit (INDEPENDENT_AMBULATORY_CARE_PROVIDER_SITE_OTHER): Payer: Medicare Other | Admitting: Internal Medicine

## 2014-03-29 VITALS — BP 136/74 | HR 104 | Temp 97.2°F | Wt 162.4 lb

## 2014-03-29 DIAGNOSIS — N951 Menopausal and female climacteric states: Secondary | ICD-10-CM

## 2014-03-29 DIAGNOSIS — H6122 Impacted cerumen, left ear: Secondary | ICD-10-CM

## 2014-03-29 DIAGNOSIS — I10 Essential (primary) hypertension: Secondary | ICD-10-CM

## 2014-03-29 DIAGNOSIS — R232 Flushing: Secondary | ICD-10-CM

## 2014-03-29 DIAGNOSIS — F32A Depression, unspecified: Secondary | ICD-10-CM

## 2014-03-29 DIAGNOSIS — F3289 Other specified depressive episodes: Secondary | ICD-10-CM

## 2014-03-29 DIAGNOSIS — F329 Major depressive disorder, single episode, unspecified: Secondary | ICD-10-CM

## 2014-03-29 DIAGNOSIS — H6120 Impacted cerumen, unspecified ear: Secondary | ICD-10-CM

## 2014-03-29 MED ORDER — BUPROPION HCL ER (XL) 150 MG PO TB24: 150.0000 mg | ORAL_TABLET | Freq: Every day | ORAL | Status: DC

## 2014-03-29 MED ORDER — ESTRADIOL 0.5 MG PO TABS: 0.5000 mg | ORAL_TABLET | Freq: Every day | ORAL | Status: DC

## 2014-03-29 NOTE — Progress Notes (Signed)
Subjective:     Patient ID: Christina Stevenson, female   DOB: 1945/08/30, 68 y.o.   MRN: [id number]  HPI Pt is a 68 y/o female w/ PMH of parkinson's disease, depression, and HTN who presents to clinic for f/u on depression. She is tearful during exam and reports being denied phone request for wellbutrin. Per chart review it was unclear if patient was taking wellbutrin or zoloft. Pt reports only taking wellbutrin. Her husband in the room believes that pt has been tearful since she has been off of her depression med. Pt reports decreased mood, anhedonia, decreased energy, and guilt. She denies difficulty with sleep, she has been taking xanax 1mg  QHS to help with sleep. She denies thoughts of hurting herself. She has tried to look for support groups for parkinson's disease but has been unable to find one. She is scheduled to see her neurologist Dr. Anne HahnWillis in 2 weeks for f/u on parkinson's disease.   Pt is also complaining of hot flashes. She has been on estrogen before stopped b/c of fear of side effects. She is interested in restarting estrogen for hot flashes.    Review of Systems  Respiratory: Negative for shortness of breath.   Cardiovascular: Negative for chest pain.  Gastrointestinal: Negative for diarrhea.  Psychiatric/Behavioral: Positive for dysphoric mood. Negative for suicidal ideas and sleep disturbance.       Objective:   Physical Exam  Constitutional: She appears well-developed and well-nourished.  HENT:  Right Ear: External ear normal.  Mouth/Throat: Oropharynx is clear and moist.  Cerumen impaction lt ear   Eyes: Pupils are equal, round, and reactive to light.  Cardiovascular: Normal rate and regular rhythm.   Pulmonary/Chest: Effort normal and breath sounds normal.  Abdominal: Soft. Bowel sounds are normal.  Musculoskeletal: She exhibits no edema.  Third right finger w/ trigger finger        Assessment:     Please see problem based assessment and plan.      Plan:      Please see problem based assessment and plan.

## 2014-03-29 NOTE — Assessment & Plan Note (Signed)
BP Readings from Last 3 Encounters:  03/29/14 136/74  10/27/13 130/71  07/06/13 140/75    Lab Results  Component Value Date   NA 140 04/06/2013   K 4.3 04/06/2013   CREATININE 1.12* 04/06/2013    Assessment: Blood pressure control:  controlled Progress toward BP goal:   at goal  Comments: pt on quinapril 20 mg once a day  Plan: Medications:  continue current medications Other: will continue to monitor, check BP at next appt in 1 month

## 2014-03-29 NOTE — Assessment & Plan Note (Signed)
Pt reports hot flashes x 2-3 months now. She has been on zoloft in the past but is no longer on it. She has also been on estrogen before but stopped because she read of the potential side effects. She is amendable to starting estrogen again.  - estrace 0.5mg  once a day x 1 month, will schedule an appt to f/u in 1 month at which time can increase estrace dose if hot flashes still present

## 2014-03-29 NOTE — Assessment & Plan Note (Signed)
Pt is tearful during exam. She reports decreased mood (was up late last night reading about her parkinson's disease), decreased energy, guilt, decreased concentration. She denies SI. She is interested in seeing a psychologist. In addition to her parkinson's disease she is sad about her son who had a stroke. She is only on wellbutrin for depression and has been off of it for awhile now after her wellbutrin refill was denied.   - pt given refill for wellbutrin 150mg   - referral made for psychology - will look into support groups for Parkinson's disease

## 2014-03-29 NOTE — Patient Instructions (Signed)
Please start taking estrace 0.5 mg once a day for one month. We will schedule you an appointment to follow up in 1 month for increase in estrace if hot flashes are still persistent.   Psychology will contact you for an appointment.

## 2014-03-29 NOTE — Progress Notes (Signed)
I saw and evaluated the patient. I personally confirmed the key portions of Dr. Truong's history and exam and reviewed pertinent patient test results. The assessment, diagnosis, and plan were formulated together and I agree with the documentation in the resident's note. 

## 2014-03-29 NOTE — Assessment & Plan Note (Signed)
On physical exam lt ear canal with occluded canal from cerumen impaction.  - lt ear irrigation today

## 2014-03-30 ENCOUNTER — Telehealth: Payer: Self-pay | Admitting: Licensed Clinical Social Worker

## 2014-03-30 NOTE — Telephone Encounter (Signed)
[facility] is a network provider.  CSW faxed Referral form.

## 2014-03-30 NOTE — Telephone Encounter (Signed)
[name] was referred to CSW for referrals for therapy.  CSW placed call to pt to discuss community resources.  Pt states she is looking for an ongoing support group for those diagnosed with Parkinson's Disease.  [name] has [facility] but felt as though it was more of an informational.  CSW informed Ms. Lappe of two other groups in the area, will confirm one is still available.  Pt denies need for Stroke support group as son had stroke 4 years ago and is now receiving care at a nursing facility.  Discussed the option for individual counseling services, pt in agreement and comfortable with copayment that may be required.  CSW will send Ms. Monrreal listing of support groups and schedule appt for individual therapy per pt's request.  CSW checking network providers.

## 2014-04-13 NOTE — Telephone Encounter (Signed)
Pt has an appt with [facility] on 06/16/2014.  CSW will sign off.

## 2014-04-14 ENCOUNTER — Encounter: Payer: Self-pay | Admitting: Neurology

## 2014-04-14 ENCOUNTER — Ambulatory Visit (INDEPENDENT_AMBULATORY_CARE_PROVIDER_SITE_OTHER): Payer: Medicare Other | Admitting: Neurology

## 2014-04-14 VITALS — BP 138/80 | HR 68 | Wt 164.0 lb

## 2014-04-14 DIAGNOSIS — R413 Other amnesia: Secondary | ICD-10-CM

## 2014-04-14 DIAGNOSIS — G20A1 Parkinson's disease without dyskinesia, without mention of fluctuations: Secondary | ICD-10-CM

## 2014-04-14 DIAGNOSIS — G20 Parkinson's disease: Secondary | ICD-10-CM

## 2014-04-14 MED ORDER — ROPINIROLE HCL 2 MG PO TABS: 2.0000 mg | ORAL_TABLET | Freq: Three times a day (TID) | ORAL | Status: DC

## 2014-04-14 NOTE — Progress Notes (Signed)
Reason for visit: Parkinson's disease  Christina Stevenson is an 68 y.o. female  History of present illness:  Christina Stevenson is a 68 year old right-handed white female with a history of Parkinson's disease. The patient remains on Requip taking 1 mg 3 times daily. She does report some drowsiness during the day, and she will take a midday nap. The patient reports some mild memory issues that have continued. She indicates that she does have some low back pain radiating around to the upper abdomen at times. The patient is still not active, she does not exercise on a regular basis. The patient does report some swallowing issues that are quite chronic, and she indicates that she was evaluated for this previously. Solids will tend to get stuck in the upper part of her throat. The patient denies any issues with liquids. The patient has had some worsening of her tremors since last seen, mainly affecting the left upper extremity. The patient denies any recent falls with exception that she toppled over one time when she bent over to pick up something. She comes to this office for an evaluation.  Past Medical History  Diagnosis Date  . Hypertension   . Uterovaginal prolapse, incomplete     with cystocele  . Spinal stenosis in cervical region   . Hyperlipemia   . Allergic rhinitis, cause unspecified   . Irritable bowel syndrome   . Bundle branch block, unspecified   . GERD (gastroesophageal reflux disease)     on bid omeprazole  . Depression 08/2009    multiple stressors: including son who has suffered 2 CVA's and is in a rehab facility, financial difficulties, loss of pets, etc.  . Osteoarthrosis, unspecified whether generalized or localized, unspecified site   . Parkinson disease 06/02/12    Probable; being follwed by Dr. Anne Hahn of [facility], has been started on Requip  . Obesity   . Sciatica of right side   . Depression   . Anxiety   . History of shingles   . Memory disturbance   . REM sleep behavior  disorder   . HERPES ZOSTER 09/11/2009    Qualifier: Diagnosis of  By: Coralee Pesa MD, Levada Schilling     Past Surgical History  Procedure Laterality Date  . Abdominal hysterectomy    . Cervical laminectomy    . Lumbar laminectomy    . Trigger finger surgery Right     Thumb  . Bladder resuspension procedure      Family History  Problem Relation Age of Onset  . Cancer - Lung Sister   . Heart Problems Father     lived to be 38 yo    Social history:  reports that she has never smoked. She has never used smokeless tobacco. She reports that she drinks alcohol. She reports that she does not use illicit drugs.    Allergies  Allergen Reactions  . Prednisone     Swelling, emotionally volatile   . Latex Rash    Medications:  Current Outpatient Prescriptions on File Prior to Visit  Medication Sig Dispense Refill  . ALPRAZolam (XANAX) 1 MG tablet take 1 tablet by mouth at bedtime  30 tablet  5  . aspirin 81 MG tablet Take 81 mg by mouth daily.      Marland Kitchen buPROPion (WELLBUTRIN XL) 150 MG 24 hr tablet Take 1 tablet (150 mg total) by mouth daily.  30 tablet  4  . calcium citrate-vitamin D (CITRACAL+D) 315-200 MG-UNIT per tablet Take 1 tablet by  mouth daily.      . cyclobenzaprine (FLEXERIL) 10 MG tablet Take 1 tablet (10 mg total) by mouth 3 (three) times daily as needed for muscle spasms.  30 tablet  3  . estradiol (ESTRACE) 0.5 MG tablet Take 1 tablet (0.5 mg total) by mouth daily.  30 tablet  0  . fexofenadine (ALLEGRA) 180 MG tablet Take 180 mg by mouth daily.      . fluocinonide-emollient (LIDEX-E) 0.05 % cream apply topically to affected area twice a day  30 g  0  . pantoprazole (PROTONIX) 20 MG tablet TAKE 1 CAPSULE TWICE DAILY BEFORE BREAKFAST AND DINNER.  60 tablet  2  . quinapril (ACCUPRIL) 20 MG tablet take 1 tablet by mouth once daily  90 tablet  0   No current facility-administered medications on file prior to visit.    ROS:  Out of a complete 14 system review of symptoms, the  patient complains only of the following symptoms, and all other reviewed systems are negative.  Fatigue Difficulty swallowing, drooling, choking Eye itching and redness Heart murmur Heat intolerance, flushing Daytime drowsiness, snoring, acting out dreams Environmental allergies, feet allergies Low back pain, neck pain, neck stiffness Bruising easily Memory loss, dizziness, headache, speech difficulties, weakness, tremors Confusion, depression, anxiety  Blood pressure 138/80, pulse 68, weight 164 lb (74.39 kg).  Physical Exam  General: The patient is alert and cooperative at the time of the examination. The patient is moderately obese.  Skin: No significant peripheral edema is noted.   Neurologic Exam  Mental status: The patient is oriented x 3. Mini-Mental status examination done today shows a total score 28/30.  Cranial nerves: Facial symmetry is present. Speech is normal, no aphasia or dysarthria is noted. Extraocular movements are full. Visual fields are full. Mild masking of the face is seen.  Motor: The patient has good strength in all 4 extremities.  Sensory examination: Soft touch sensation is symmetric on the face, arms, and legs.  Coordination: The patient has good finger-nose-finger and heel-to-shin bilaterally. Resting tremors are present on both upper extremities, left greater than right  Gait and station: The patient has the ability to stand with arms crossed from a seated position. Once up, the patient does have bilateral arm swing, some tremor with ambulation in both arms. The patient has good stride, good turns. Tandem gait is slightly unsteady. Romberg is negative. No drift is seen.  Reflexes: Deep tendon reflexes are symmetric.   Assessment/Plan:  One. Parkinson's disease  2. Obesity  3. Mild memory disturbance  I have once again suggested that the patient enter a regular exercise program to help slow down progression of Parkinson's disease. She will  be increased on the Requip, going to a 2 mg tablet, 3 times daily, phasing in over 2 or 3 weeks. The patient will followup in about 5 months. She will contact me if the medication increase results in too many side effects. The patient will be followed for the memory issue.  Marlan Palau MD 04/16/2014 3:22 PM  [facility] [address] [place], [place] [address]  Phone [phone] Fax [phone]

## 2014-04-14 NOTE — Patient Instructions (Addendum)
With the ropinerole, start taking  in the morning and noon, 2 mg at night for one week, then take  in the morning and evening and 1 mg at noon for one week, then start 2 mg three times a day  Parkinson Disease Parkinson disease is a disorder of the central nervous system, which includes the brain and spinal cord. A person with this disease slowly loses the ability to completely control body movements. Within the brain, there is a group of nerve cells (basal ganglia) that help control movement. The basal ganglia are damaged and do not work properly in a person with Parkinson disease. In addition, the basal ganglia produce and use a brain chemical called dopamine. The dopamine chemical sends messages to other parts of the body to control and coordinate body movements. Dopamine levels are low in a person with Parkinson disease. If the dopamine levels are low, then the body does not receive the correct messages it needs to move normally.  CAUSES  The exact reason why the basal ganglia get damaged is not known. Some medical researchers have thought that infection, genes, environment, and certain medicines may contribute to the cause.  SYMPTOMS   An early symptom of Parkinson disease is often an uncontrolled shaking (tremor) of the hands. The tremor will often disappear when the affected hand is consciously used.  As the disease progresses, walking, talking, getting out of a chair, and new movements become more difficult.  Muscles get stiff and movements become slower.  Balance and coordination become harder.  Depression, trouble swallowing, urinary problems, constipation, and sleep problems can occur.  Later in the disease, memory and thought processes may deteriorate. DIAGNOSIS  There are no specific tests to diagnose Parkinson disease. You may be referred to a neurologist for evaluation. Your caregiver will ask about your medical history, symptoms, and perform a physical exam. Blood tests and  imaging tests of your brain may be performed to rule out other diseases. The imaging tests may include an MRI or a CT scan. TREATMENT  The goal of treatment is to relieve symptoms. Medicines may be prescribed once the symptoms become troublesome. Medicine will not stop the progression of the disease, but medicine can make movement and balance better and help control tremors. Speech and occupational therapy may also be prescribed. Sometimes, surgical treatment of the brain can be done in young people. HOME CARE INSTRUCTIONS  Get regular exercise and rest periods during the day to help prevent exhaustion and depression.  If getting dressed becomes difficult, replace buttons and zippers with Velcro and elastic on your clothing.  Take all medicine as directed by your caregiver.  Install grab bars or railings in your home to prevent falls.  Go to speech or occupational therapy as directed.  Keep all follow-up visits as directed by your caregiver. SEEK MEDICAL CARE IF:  Your symptoms are not controlled with your medicine.  You fall.  You have trouble swallowing or choke on your food. MAKE SURE YOU:  Understand these instructions.  Will watch your condition.  Will get help right away if you are not doing well or get worse. Document Released: 08/01/2000 Document Revised: 11/29/2012 Document Reviewed: 09/03/2011 [facility] Patient Information 2015 [place], [place]. This information is not intended to replace advice given to you by your health care provider. Make sure you discuss any questions you have with your health care provider.

## 2014-04-20 ENCOUNTER — Other Ambulatory Visit: Payer: Self-pay | Admitting: Neurology

## 2014-04-20 NOTE — Telephone Encounter (Signed)
Per note from 01/21/13

## 2014-04-20 NOTE — Telephone Encounter (Signed)
Rx signed and faxed.

## 2014-04-21 ENCOUNTER — Other Ambulatory Visit: Payer: Self-pay | Admitting: *Deleted

## 2014-04-21 DIAGNOSIS — R232 Flushing: Secondary | ICD-10-CM

## 2014-04-24 MED ORDER — ESTRADIOL 0.5 MG PO TABS: 0.5000 mg | ORAL_TABLET | Freq: Every day | ORAL | Status: DC

## 2014-04-26 ENCOUNTER — Other Ambulatory Visit: Payer: Self-pay | Admitting: Internal Medicine

## 2014-04-26 ENCOUNTER — Other Ambulatory Visit: Payer: Self-pay | Admitting: *Deleted

## 2014-04-27 ENCOUNTER — Telehealth: Payer: Self-pay | Admitting: *Deleted

## 2014-04-27 NOTE — Telephone Encounter (Addendum)
Received PA request from pt's pharmacy for estradiol 5 mg tabs once daily. Medication is non-preferred, contacted pt's insurance at [phone] to initiate PA. Dx code 627.2 (hot flashes), was also on zoloft in the past. Request sent for review.  Make take up to 72hrs for a response.Kingsley Spittle Cassady9/10/20154:04 PM      Member ID# [id number]  Case # [id number]

## 2014-04-29 MED ORDER — QUINAPRIL HCL 20 MG PO TABS: 20.0000 mg | ORAL_TABLET | Freq: Every day | ORAL | Status: DC

## 2014-05-02 ENCOUNTER — Other Ambulatory Visit: Payer: Self-pay | Admitting: Internal Medicine

## 2014-05-03 ENCOUNTER — Encounter: Payer: Medicare Other | Admitting: Internal Medicine

## 2014-05-03 NOTE — Telephone Encounter (Signed)
Received faxed confirmation that medication has been approved through 04/28/2015, pharmacy aware.Christina Spittle Cassady9/16/20159:31 AM

## 2014-05-18 ENCOUNTER — Other Ambulatory Visit: Payer: Self-pay | Admitting: *Deleted

## 2014-05-18 DIAGNOSIS — R232 Flushing: Secondary | ICD-10-CM

## 2014-05-22 NOTE — Telephone Encounter (Signed)
Patient supposed to have follow up appointment prior to further refills, but she cancelled it.  Will not refill.

## 2014-05-31 ENCOUNTER — Encounter: Payer: Self-pay | Admitting: Internal Medicine

## 2014-05-31 ENCOUNTER — Other Ambulatory Visit: Payer: Self-pay | Admitting: *Deleted

## 2014-05-31 ENCOUNTER — Encounter: Payer: Self-pay | Admitting: Neurology

## 2014-05-31 DIAGNOSIS — R232 Flushing: Secondary | ICD-10-CM

## 2014-05-31 NOTE — Telephone Encounter (Signed)
Pt has appt 12/2 with you

## 2014-06-01 MED ORDER — ESTRADIOL 0.5 MG PO TABS: 0.5000 mg | ORAL_TABLET | Freq: Every day | ORAL | Status: DC

## 2014-06-16 ENCOUNTER — Ambulatory Visit (HOSPITAL_COMMUNITY): Payer: Medicare Other | Admitting: Psychiatry

## 2014-06-27 ENCOUNTER — Encounter: Payer: Self-pay | Admitting: Internal Medicine

## 2014-06-28 ENCOUNTER — Other Ambulatory Visit: Payer: Self-pay | Admitting: Internal Medicine

## 2014-06-28 MED ORDER — ESTRADIOL 1 MG PO TABS: 1.0000 mg | ORAL_TABLET | Freq: Every day | ORAL | Status: DC

## 2014-07-19 ENCOUNTER — Ambulatory Visit (INDEPENDENT_AMBULATORY_CARE_PROVIDER_SITE_OTHER): Payer: Medicare Other | Admitting: Internal Medicine

## 2014-07-19 VITALS — BP 125/53 | HR 77 | Temp 98.0°F | Wt 164.0 lb

## 2014-07-19 DIAGNOSIS — M542 Cervicalgia: Secondary | ICD-10-CM

## 2014-07-19 DIAGNOSIS — F32A Depression, unspecified: Secondary | ICD-10-CM

## 2014-07-19 DIAGNOSIS — K219 Gastro-esophageal reflux disease without esophagitis: Secondary | ICD-10-CM

## 2014-07-19 DIAGNOSIS — F329 Major depressive disorder, single episode, unspecified: Secondary | ICD-10-CM

## 2014-07-19 DIAGNOSIS — R232 Flushing: Secondary | ICD-10-CM

## 2014-07-19 DIAGNOSIS — N951 Menopausal and female climacteric states: Secondary | ICD-10-CM

## 2014-07-19 MED ORDER — PANTOPRAZOLE SODIUM 20 MG PO TBEC: DELAYED_RELEASE_TABLET | ORAL | Status: DC

## 2014-07-19 MED ORDER — BUPROPION HCL ER (XL) 150 MG PO TB24: 150.0000 mg | ORAL_TABLET | Freq: Every day | ORAL | Status: DC

## 2014-07-19 MED ORDER — ESTRADIOL 2 MG PO TABS: 2.0000 mg | ORAL_TABLET | Freq: Every day | ORAL | Status: DC

## 2014-07-19 NOTE — Patient Instructions (Signed)
Good job on exercising 5-6 days a week!!  I have increased your estrace to 2mg  daily but stop taking estrace for 1 week before starting the new 2mg  dose. You will then start taking estrace 2mg  once a day for 3 weeks and then stop for 1 week and resume estrace daily for 3 weeks again. You will continue to repeat this 3 weeks on estrace and 1 week off estrace cycle for hot flashes.

## 2014-07-20 LAB — BASIC METABOLIC PANEL
BUN: 16 mg/dL (ref 6–23)
CO2: 28 mEq/L (ref 19–32)
Calcium: 9 mg/dL (ref 8.4–10.5)
Chloride: 104 mEq/L (ref 96–112)
Creat: 0.96 mg/dL (ref 0.50–1.10)
Glucose, Bld: 106 mg/dL — ABNORMAL HIGH (ref 70–99)
POTASSIUM: 4 meq/L (ref 3.5–5.3)
SODIUM: 139 meq/L (ref 135–145)

## 2014-07-20 NOTE — Assessment & Plan Note (Signed)
Pt still complaining of hot flashes, estrace was recently increased to 1mg  daily. On PE hands and tips of ears were flushed.   - instructed to stop taking estrace x 1 week, then resume new dose of 2mg  daily. Instructed to continue 1 week off 3 weeks on of estrace 2mg .  - f/u in 3 months

## 2014-07-20 NOTE — Progress Notes (Signed)
   Subjective:    Patient ID: Payton MccallumBonnie M Trueheart, female    DOB: [birth year], 68 y.o.   MRN: [id number]  HPI Pt is a 68 y/o female w/ PMH of parkinson's disease, depression, and HTN who presents to clinic for f/u. Pt has no complaints today. States she is feeling better in regards to mood. She has been taking ibuprofen 800mg  once - twice a day for neck back which helps relieve pain. Pain is non radiating and sharp in quality. 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 She does not take flexeril at night b/c she takes xanax qhs for sleep.   Requesting refills of estrace, wellbutrin, and protonix.     Review of Systems  Constitutional:       Walks 5-6 days a week, 0.5 miles each time   Eyes: Negative for visual disturbance.  Respiratory: Negative for shortness of breath.   Cardiovascular: Negative for chest pain.  Gastrointestinal: Negative for abdominal pain.  Musculoskeletal: Positive for neck pain.  Neurological: Positive for tremors.  Psychiatric/Behavioral: Negative for dysphoric mood.       Objective:   Physical Exam  Constitutional: She appears well-developed and well-nourished. No distress.  HENT:  Head: Normocephalic.  Cardiovascular: Normal rate and regular rhythm.   Pulmonary/Chest: Effort normal and breath sounds normal.  Abdominal: Soft. Bowel sounds are normal.  Neurological:  Left hand tremors> rt hand  Skin:  Palms of hands and tips of ears b/l flushed  Psychiatric: She has a normal mood and affect.          Assessment & Plan:  Please see problem based assessment and plan.

## 2014-07-20 NOTE — Assessment & Plan Note (Signed)
Pt appears better than her usual state today, says she is feeling better in regards to mood. Usually she is tearful during her appointments.   - refilled wellbutrin 150mg  daily - given handout for parkinsons 8 week exercise class [facility]. Pt has called them in the past before but was unable to reach anyone. Will try to contact them as well as pt is extremely interested in activities/support groups for parkinsons disease.

## 2014-07-20 NOTE — Assessment & Plan Note (Signed)
Given refill for protonix 20mg  BID. Instructed to take 30mins before breakfast and dinner. Pt states reflux is doing well, she will have to take an occasional Tums.

## 2014-07-23 NOTE — Progress Notes (Signed)
I saw and evaluated the patient. I personally confirmed the key portions of Dr. Truong's history and exam and reviewed pertinent patient test results. The assessment, diagnosis, and plan were formulated together and I agree with the documentation in the resident's note. 

## 2014-07-24 ENCOUNTER — Other Ambulatory Visit: Payer: Self-pay

## 2014-07-24 MED ORDER — ROPINIROLE HCL 2 MG PO TABS: 2.0000 mg | ORAL_TABLET | Freq: Three times a day (TID) | ORAL | Status: DC

## 2014-07-24 MED ORDER — ALPRAZOLAM 1 MG PO TABS: 1.0000 mg | ORAL_TABLET | Freq: Every day | ORAL | Status: DC

## 2014-07-25 NOTE — Telephone Encounter (Signed)
Rx signed and faxed.

## 2014-08-02 NOTE — Addendum Note (Signed)
Addended by: Maura CrandallGOLDSTON, Kellyann Ordway C on: 08/02/2014 04:05 PM   Modules accepted: Orders

## 2014-09-13 ENCOUNTER — Encounter: Payer: Self-pay | Admitting: Neurology

## 2014-09-13 ENCOUNTER — Ambulatory Visit (INDEPENDENT_AMBULATORY_CARE_PROVIDER_SITE_OTHER): Payer: Medicare Other | Admitting: Neurology

## 2014-09-13 VITALS — BP 123/67 | HR 73 | Ht 60.0 in | Wt 160.2 lb

## 2014-09-13 DIAGNOSIS — R413 Other amnesia: Secondary | ICD-10-CM

## 2014-09-13 DIAGNOSIS — G20 Parkinson's disease: Secondary | ICD-10-CM | POA: Diagnosis not present

## 2014-09-13 MED ORDER — ROPINIROLE HCL 3 MG PO TABS: 3.0000 mg | ORAL_TABLET | Freq: Three times a day (TID) | ORAL | Status: DC

## 2014-09-13 NOTE — Patient Instructions (Signed)
Parkinson Disease Parkinson disease is a disorder of the central nervous system, which includes the brain and spinal cord. A person with this disease slowly loses the ability to completely control body movements. Within the brain, there is a group of nerve cells (basal ganglia) that help control movement. The basal ganglia are damaged and do not work properly in a person with Parkinson disease. In addition, the basal ganglia produce and use a brain chemical called dopamine. The dopamine chemical sends messages to other parts of the body to control and coordinate body movements. Dopamine levels are low in a person with Parkinson disease. If the dopamine levels are low, then the body does not receive the correct messages it needs to move normally.  CAUSES  The exact reason why the basal ganglia get damaged is not known. Some medical researchers have thought that infection, genes, environment, and certain medicines may contribute to the cause.  SYMPTOMS   An early symptom of Parkinson disease is often an uncontrolled shaking (tremor) of the hands. The tremor will often disappear when the affected hand is consciously used.  As the disease progresses, walking, talking, getting out of a chair, and new movements become more difficult.  Muscles get stiff and movements become slower.  Balance and coordination become harder.  Depression, trouble swallowing, urinary problems, constipation, and sleep problems can occur.  Later in the disease, memory and thought processes may deteriorate. DIAGNOSIS  There are no specific tests to diagnose Parkinson disease. You may be referred to a neurologist for evaluation. Your caregiver will ask about your medical history, symptoms, and perform a physical exam. Blood tests and imaging tests of your brain may be performed to rule out other diseases. The imaging tests may include an MRI or a CT scan. TREATMENT  The goal of treatment is to relieve symptoms. Medicines may be  prescribed once the symptoms become troublesome. Medicine will not stop the progression of the disease, but medicine can make movement and balance better and help control tremors. Speech and occupational therapy may also be prescribed. Sometimes, surgical treatment of the brain can be done in young people. HOME CARE INSTRUCTIONS  Get regular exercise and rest periods during the day to help prevent exhaustion and depression.  If getting dressed becomes difficult, replace buttons and zippers with Velcro and elastic on your clothing.  Take all medicine as directed by your caregiver.  Install grab bars or railings in your home to prevent falls.  Go to speech or occupational therapy as directed.  Keep all follow-up visits as directed by your caregiver. SEEK MEDICAL CARE IF:  Your symptoms are not controlled with your medicine.  You fall.  You have trouble swallowing or choke on your food. MAKE SURE YOU:  Understand these instructions.  Will watch your condition.  Will get help right away if you are not doing well or get worse. Document Released: 08/01/2000 Document Revised: 11/29/2012 Document Reviewed: 09/03/2011 Exit[employer] Patient Information 2015 [employer]. This information is not intended to replace advice given to you by your health care provider. Make sure you discuss any questions you have with your health care provider.  

## 2014-09-13 NOTE — Progress Notes (Signed)
Reason for visit: Parkinson's disease  Christina Stevenson is an 69 y.o. female  History of present illness:  Christina Stevenson is a 69 year old right-handed white female with a history of Parkinson's disease. The patient has done relatively well since last seen without much change in her functional level. She has noted that her resting tremors affecting the left hand have worsened somewhat. The patient has not been as active as usual over the last month, but she was walking 5 miles a week. The patient has not had any falls. She does note some occasional choking, she recently thinks that she passed a kidney stone. She is on Requip taking 2 mg 3 times daily, and she is tolerating the medication well. She does report some mild memory issues. She returns for an evaluation. There have been no changes in her activities of daily living.  Past Medical History  Diagnosis Date  . Hypertension   . Uterovaginal prolapse, incomplete     with cystocele  . Spinal stenosis in cervical region   . Hyperlipemia   . Allergic rhinitis, cause unspecified   . Irritable bowel syndrome   . Bundle branch block, unspecified   . GERD (gastroesophageal reflux disease)     on bid omeprazole  . Depression 08/2009    multiple stressors: including son who has suffered 2 CVA's and is in a rehab facility, financial difficulties, loss of pets, etc.  . Osteoarthrosis, unspecified whether generalized or localized, unspecified site   . Parkinson disease 06/02/12    Probable; being follwed by Dr. Anne HahnWillis of [facility], has been started on Requip  . Obesity   . Sciatica of right side   . Depression   . Anxiety   . History of shingles   . Memory disturbance   . REM sleep behavior disorder   . HERPES ZOSTER 09/11/2009    Qualifier: Diagnosis of  By: Coralee PesaWoodyear MD, Levada SchillingWynne E     Past Surgical History  Procedure Laterality Date  . Abdominal hysterectomy    . Cervical laminectomy    . Lumbar laminectomy    . Trigger finger surgery Right      Thumb  . Bladder resuspension procedure      Family History  Problem Relation Age of Onset  . Cancer - Lung Sister   . Heart Problems Father     lived to be 987 yo    Social history:  reports that she has never smoked. She has never used smokeless tobacco. She reports that she drinks alcohol. She reports that she does not use illicit drugs.    Allergies  Allergen Reactions  . Prednisone     Swelling, emotionally volatile   . Latex Rash    Medications:  Current Outpatient Prescriptions on File Prior to Visit  Medication Sig Dispense Refill  . ALPRAZolam (XANAX) 1 MG tablet Take 1 tablet (1 mg total) by mouth at bedtime. 90 tablet 1  . aspirin 81 MG tablet Take 81 mg by mouth daily.    Marland Kitchen. buPROPion (WELLBUTRIN XL) 150 MG 24 hr tablet Take 1 tablet (150 mg total) by mouth daily. 90 tablet 4  . calcium citrate-vitamin D (CITRACAL+D) 315-200 MG-UNIT per tablet Take 1 tablet by mouth daily.    . cyclobenzaprine (FLEXERIL) 10 MG tablet Take 1 tablet (10 mg total) by mouth 3 (three) times daily as needed for muscle spasms. 30 tablet 3  . estradiol (ESTRACE) 2 MG tablet Take 1 tablet (2 mg total) by mouth daily.  90 tablet 0  . fexofenadine (ALLEGRA) 180 MG tablet Take 180 mg by mouth daily.    . fluocinonide-emollient (LIDEX-E) 0.05 % cream apply topically to affected area twice a day 30 g 0  . pantoprazole (PROTONIX) 20 MG tablet TAKE 1 TABLET TWICE DAILY BEFORE BREAKFAST AND DINNER. 180 tablet 4  . quinapril (ACCUPRIL) 20 MG tablet Take 1 tablet (20 mg total) by mouth daily. 90 tablet 6   No current facility-administered medications on file prior to visit.    ROS:  Out of a complete 14 system review of symptoms, the patient complains only of the following symptoms, and all other reviewed systems are negative.  Runny nose, difficulty swallowing, drooling Eye itching, eye redness, blurred vision Cough, choking Heart murmur Heat intolerance, flushing Constipation Snoring,  sleep talking, acting out dreams Frequency of urination Bruising easily Memory loss, headache, tremors Depression Skin rash, itching  Blood pressure 123/67, pulse 73, height 5' (1.524 m), weight 160 lb 3.2 oz (72.666 kg).  Physical Exam  General: The patient is alert and cooperative at the time of the examination.  Skin: No significant peripheral edema is noted.   Neurologic Exam  Mental status: The patient is oriented x 3. Mini-Mental Status Examination done today shows a total score of 29/30.   Cranial nerves: Facial symmetry is present. Speech is normal, no aphasia or dysarthria is noted. Extraocular movements are full. Visual fields are full. Mild masking of the face is seen.  Motor: The patient has good strength in all 4 extremities.  Sensory examination: Soft touch sensation is symmetric on the face, arms, and legs.  Coordination: The patient has good finger-nose-finger and heel-to-shin bilaterally. Patient has a resting tremor with the left upper extremity.  Gait and station: The patient has a normal gait. There may be some slight decrease in arm swing on the right. The patient is able to arise from a seated position with arms crossed.  Tandem gait is slightly unsteady. Romberg is negative. No drift is seen.  Reflexes: Deep tendon reflexes are symmetric.   Assessment/Plan:  1. Parkinson's disease  2. Memory disturbance  The patient is doing relatively well with her functional level. We will go up slightly on the Requip taking 3 mg 3 times daily. She will follow up in the office in about 5 months. She will contact our office if she is not tolerating the new medication increase.    Marlan Palau MD 09/13/2014 6:54 PM  [facility] [address] [place], [place] [address]  Phone [phone] Fax [phone]

## 2014-10-12 ENCOUNTER — Other Ambulatory Visit: Payer: Self-pay

## 2014-10-12 MED ORDER — CYCLOBENZAPRINE HCL 10 MG PO TABS: 10.0000 mg | ORAL_TABLET | Freq: Three times a day (TID) | ORAL | Status: DC | PRN

## 2014-10-26 ENCOUNTER — Other Ambulatory Visit: Payer: Self-pay | Admitting: Neurology

## 2014-10-26 MED ORDER — ALPRAZOLAM 1 MG PO TABS: 1.0000 mg | ORAL_TABLET | Freq: Every day | ORAL | Status: DC

## 2014-10-26 NOTE — Telephone Encounter (Signed)
Patient requesting refill for Rx ALPRAZolam (XANAX) 1 MG tablet, she's completely out of medication.  Please call and advise.

## 2014-10-27 NOTE — Telephone Encounter (Signed)
Rx signed and faxed.

## 2014-12-05 ENCOUNTER — Other Ambulatory Visit: Payer: Self-pay | Admitting: Internal Medicine

## 2014-12-29 ENCOUNTER — Other Ambulatory Visit: Payer: Self-pay | Admitting: Neurology

## 2015-01-10 ENCOUNTER — Other Ambulatory Visit: Payer: Self-pay | Admitting: *Deleted

## 2015-01-10 DIAGNOSIS — F32A Depression, unspecified: Secondary | ICD-10-CM

## 2015-01-10 DIAGNOSIS — K219 Gastro-esophageal reflux disease without esophagitis: Secondary | ICD-10-CM

## 2015-01-10 DIAGNOSIS — F329 Major depressive disorder, single episode, unspecified: Secondary | ICD-10-CM

## 2015-01-11 MED ORDER — PANTOPRAZOLE SODIUM 20 MG PO TBEC: DELAYED_RELEASE_TABLET | ORAL | Status: DC

## 2015-01-11 MED ORDER — QUINAPRIL HCL 20 MG PO TABS: 20.0000 mg | ORAL_TABLET | Freq: Every day | ORAL | Status: DC

## 2015-01-11 MED ORDER — BUPROPION HCL ER (XL) 150 MG PO TB24: 150.0000 mg | ORAL_TABLET | Freq: Every day | ORAL | Status: DC

## 2015-02-06 ENCOUNTER — Encounter: Payer: Self-pay | Admitting: Neurology

## 2015-02-06 ENCOUNTER — Ambulatory Visit (INDEPENDENT_AMBULATORY_CARE_PROVIDER_SITE_OTHER): Payer: Medicare Other | Admitting: Neurology

## 2015-02-06 VITALS — BP 133/69 | HR 87 | Ht 60.0 in | Wt 158.2 lb

## 2015-02-06 DIAGNOSIS — R413 Other amnesia: Secondary | ICD-10-CM | POA: Diagnosis not present

## 2015-02-06 DIAGNOSIS — G20 Parkinson's disease: Secondary | ICD-10-CM | POA: Diagnosis not present

## 2015-02-06 MED ORDER — ROPINIROLE HCL 3 MG PO TABS: ORAL_TABLET | ORAL | Status: DC

## 2015-02-06 NOTE — Progress Notes (Signed)
Reason for visit: Parkinson's disease  Christina Stevenson is an 69 y.o. female  History of present illness:  Christina Stevenson is a 69 year old right-handed white female with a history of Parkinson's disease and some mild memory issues. The patient has done relatively well since last seen. The patient is becoming more active, and this has helped her balance and mobility. She remains on Requip taking 3 mg 3 times daily. She is reporting some issues with drooling in the evenings, and some problems with choking with liquids, even with her own saliva. The patient indicates that this is a daily event. She is having some problems with garbled speech, and her husband has difficulty understanding her at times. She has not had any falls. She is operating motor vehicle, doing well with this. She has not given up any activities of daily living secondary to the Parkinson's disease or due to the memory issues. She believes that the memory has been stable over time. The patient indicates that she was using hemp oil at one point which seemed to help the tremors and the swallowing. She stopped the medication because it was causing stomach upset.  Past Medical History  Diagnosis Date  . Hypertension   . Uterovaginal prolapse, incomplete     with cystocele  . Spinal stenosis in cervical region   . Hyperlipemia   . Allergic rhinitis, cause unspecified   . Irritable bowel syndrome   . Bundle branch block, unspecified   . GERD (gastroesophageal reflux disease)     on bid omeprazole  . Depression 08/2009    multiple stressors: including son who has suffered 2 CVA's and is in a rehab facility, financial difficulties, loss of pets, etc.  . Osteoarthrosis, unspecified whether generalized or localized, unspecified site   . Parkinson disease 06/02/12    Probable; being follwed by Dr. Anne Hahn of [facility], has been started on Requip  . Obesity   . Sciatica of right side   . Depression   . Anxiety   . History of shingles   .  Memory disturbance   . REM sleep behavior disorder   . HERPES ZOSTER 09/11/2009    Qualifier: Diagnosis of  By: Coralee Pesa MD, Levada Schilling Dermatitis     Past Surgical History  Procedure Laterality Date  . Abdominal hysterectomy    . Cervical laminectomy    . Lumbar laminectomy    . Trigger finger surgery Right     Thumb  . Bladder resuspension procedure      Family History  Problem Relation Age of Onset  . Cancer - Lung Sister   . Heart Problems Father     lived to be 61 yo    Social history:  reports that she has never smoked. She has never used smokeless tobacco. She reports that she does not drink alcohol or use illicit drugs.    Allergies  Allergen Reactions  . Prednisone     Swelling, emotionally volatile   . Latex Rash    Medications:  Prior to Admission medications   Medication Sig Start Date End Date Taking? Authorizing Provider  ALPRAZolam Prudy Feeler) 1 MG tablet Take 1 tablet (1 mg total) by mouth at bedtime. 10/26/14  Yes York Spaniel, MD  aspirin 81 MG tablet Take 81 mg by mouth daily.   Yes Historical Provider, MD  buPROPion (WELLBUTRIN XL) 150 MG 24 hr tablet Take 1 tablet (150 mg total) by mouth daily. 01/11/15  Yes Denton Brick,  MD  calcium citrate-vitamin D (CITRACAL+D) 315-200 MG-UNIT per tablet Take 1 tablet by mouth daily.   Yes Historical Provider, MD  cyclobenzaprine (FLEXERIL) 10 MG tablet Take 1 tablet (10 mg total) by mouth 3 (three) times daily as needed for muscle spasms. 10/12/14  Yes York Spaniel, MD  diphenhydrAMINE (BENADRYL) 25 mg capsule Take 25 mg by mouth every 6 (six) hours as needed.   Yes Historical Provider, MD  estradiol (ESTRACE) 2 MG tablet take 1 tablet by mouth once daily 12/05/14  Yes Denton Brick, MD  fluocinonide-emollient (LIDEX-E) 0.05 % cream apply topically to affected area twice a day 05/18/13  Yes Neema K Everardo Beals, MD  pantoprazole (PROTONIX) 20 MG tablet TAKE 1 TABLET TWICE DAILY BEFORE BREAKFAST AND DINNER. 01/11/15   Yes Denton Brick, MD  quinapril (ACCUPRIL) 20 MG tablet Take 1 tablet (20 mg total) by mouth daily. 01/11/15  Yes Denton Brick, MD  rOPINIRole (REQUIP) 3 MG tablet Take 1 tablet by mouth 3  times daily 12/29/14  Yes York Spaniel, MD    ROS:  Out of a complete 14 system review of symptoms, the patient complains only of the following symptoms, and all other reviewed systems are negative.  Ringing in ears, difficulty swallowing, drooling Cough, choking Heat intolerance Food allergies Urgency of the bladder Memory loss, dizziness, headache, numbness  Blood pressure 133/69, pulse 87, height 5' (1.524 m), weight 158 lb 3.2 oz (71.759 kg).  Physical Exam  General: The patient is alert and cooperative at the time of the examination.  Skin: No significant peripheral edema is noted.   Neurologic Exam  Mental status: The patient is alert and oriented x 3 at the time of the examination. The patient has apparent normal recent and remote memory, with an apparently normal attention span and concentration ability.   Cranial nerves: Facial symmetry is present. Speech is normal, no aphasia or dysarthria is noted. Extraocular movements are full. Visual fields are full. Minimal masking of the face is seen.  Motor: The patient has good strength in all 4 extremities.  Sensory examination: Soft touch sensation is symmetric on the face, arms, and legs.  Coordination: The patient has good finger-nose-finger and heel-to-shin bilaterally. Resting tremors are seen with both upper extremities.  Gait and station: The patient has a normal gait. The patient is able to arise from a seated position with arms crossed. Tandem gait is normal. Romberg is negative. No drift is seen.  Reflexes: Deep tendon reflexes are symmetric.   Assessment/Plan:  1. Parkinson's disease  2. Mild memory disturbance  The patient is having prominent resting tremors, but treatment of this is not recommended, as the  medications used may have a negative impact on the memory. The patient will remain on the Requip taking 3 mg 3 times daily. Sinemet will be added in the future. She is remaining active, and her mobility is excellent. Speech therapy evaluation for swallowing and for speech may be required in the future. She will follow-up in 6 months. A prescription was given for the Requip.  Marlan Palau MD 02/06/2015 8:41 PM  [facility] [address] [place], [place] [address]  Phone [phone] Fax [phone]

## 2015-02-06 NOTE — Patient Instructions (Signed)
Parkinson Disease Parkinson disease is a disorder of the central nervous system, which includes the brain and spinal cord. A person with this disease slowly loses the ability to completely control body movements. Within the brain, there is a group of nerve cells (basal ganglia) that help control movement. The basal ganglia are damaged and do not work properly in a person with Parkinson disease. In addition, the basal ganglia produce and use a brain chemical called dopamine. The dopamine chemical sends messages to other parts of the body to control and coordinate body movements. Dopamine levels are low in a person with Parkinson disease. If the dopamine levels are low, then the body does not receive the correct messages it needs to move normally.  CAUSES  The exact reason why the basal ganglia get damaged is not known. Some medical researchers have thought that infection, genes, environment, and certain medicines may contribute to the cause.  SYMPTOMS   An early symptom of Parkinson disease is often an uncontrolled shaking (tremor) of the hands. The tremor will often disappear when the affected hand is consciously used.  As the disease progresses, walking, talking, getting out of a chair, and new movements become more difficult.  Muscles get stiff and movements become slower.  Balance and coordination become harder.  Depression, trouble swallowing, urinary problems, constipation, and sleep problems can occur.  Later in the disease, memory and thought processes may deteriorate. DIAGNOSIS  There are no specific tests to diagnose Parkinson disease. You may be referred to a neurologist for evaluation. Your caregiver will ask about your medical history, symptoms, and perform a physical exam. Blood tests and imaging tests of your brain may be performed to rule out other diseases. The imaging tests may include an MRI or a CT scan. TREATMENT  The goal of treatment is to relieve symptoms. Medicines may be  prescribed once the symptoms become troublesome. Medicine will not stop the progression of the disease, but medicine can make movement and balance better and help control tremors. Speech and occupational therapy may also be prescribed. Sometimes, surgical treatment of the brain can be done in young people. HOME CARE INSTRUCTIONS  Get regular exercise and rest periods during the day to help prevent exhaustion and depression.  If getting dressed becomes difficult, replace buttons and zippers with Velcro and elastic on your clothing.  Take all medicine as directed by your caregiver.  Install grab bars or railings in your home to prevent falls.  Go to speech or occupational therapy as directed.  Keep all follow-up visits as directed by your caregiver. SEEK MEDICAL CARE IF:  Your symptoms are not controlled with your medicine.  You fall.  You have trouble swallowing or choke on your food. MAKE SURE YOU:  Understand these instructions.  Will watch your condition.  Will get help right away if you are not doing well or get worse. Document Released: 08/01/2000 Document Revised: 11/29/2012 Document Reviewed: 09/03/2011 [employer] Patient Information 2015 [place], [place]. This information is not intended to replace advice given to you by your health care provider. Make sure you discuss any questions you have with your health care provider. Parkinson Disease Parkinson disease is a disorder of the central nervous system, which includes the brain and spinal cord. A person with this disease slowly loses the ability to completely control body movements. Within the brain, there is a group of nerve cells (basal ganglia) that help control movement. The basal ganglia are damaged and do not work properly in a person  with Parkinson disease. In addition, the basal ganglia produce and use a brain chemical called dopamine. The dopamine chemical sends messages to other parts of the body to control and coordinate  body movements. Dopamine levels are low in a person with Parkinson disease. If the dopamine levels are low, then the body does not receive the correct messages it needs to move normally.  CAUSES  The exact reason why the basal ganglia get damaged is not known. Some medical researchers have thought that infection, genes, environment, and certain medicines may contribute to the cause.  SYMPTOMS   An early symptom of Parkinson disease is often an uncontrolled shaking (tremor) of the hands. The tremor will often disappear when the affected hand is consciously used.  As the disease progresses, walking, talking, getting out of a chair, and new movements become more difficult.  Muscles get stiff and movements become slower.  Balance and coordination become harder.  Depression, trouble swallowing, urinary problems, constipation, and sleep problems can occur.  Later in the disease, memory and thought processes may deteriorate. DIAGNOSIS  There are no specific tests to diagnose Parkinson disease. You may be referred to a neurologist for evaluation. Your caregiver will ask about your medical history, symptoms, and perform a physical exam. Blood tests and imaging tests of your brain may be performed to rule out other diseases. The imaging tests may include an MRI or a CT scan. TREATMENT  The goal of treatment is to relieve symptoms. Medicines may be prescribed once the symptoms become troublesome. Medicine will not stop the progression of the disease, but medicine can make movement and balance better and help control tremors. Speech and occupational therapy may also be prescribed. Sometimes, surgical treatment of the brain can be done in young people. HOME CARE INSTRUCTIONS  Get regular exercise and rest periods during the day to help prevent exhaustion and depression.  If getting dressed becomes difficult, replace buttons and zippers with Velcro and elastic on your clothing.  Take all medicine as  directed by your caregiver.  Install grab bars or railings in your home to prevent falls.  Go to speech or occupational therapy as directed.  Keep all follow-up visits as directed by your caregiver. SEEK MEDICAL CARE IF:  Your symptoms are not controlled with your medicine.  You fall.  You have trouble swallowing or choke on your food. MAKE SURE YOU:  Understand these instructions.  Will watch your condition.  Will get help right away if you are not doing well or get worse. Document Released: 08/01/2000 Document Revised: 11/29/2012 Document Reviewed: 09/03/2011 [employer] Patient Information 2015 [employer]. This information is not intended to replace advice given to you by your health care provider. Make sure you discuss any questions you have with your health care provider.  

## 2015-02-12 ENCOUNTER — Ambulatory Visit: Payer: Medicare Other | Admitting: Neurology

## 2015-02-21 ENCOUNTER — Encounter: Payer: Self-pay | Admitting: Internal Medicine

## 2015-02-21 ENCOUNTER — Ambulatory Visit (INDEPENDENT_AMBULATORY_CARE_PROVIDER_SITE_OTHER): Payer: Medicare Other | Admitting: Internal Medicine

## 2015-02-21 VITALS — BP 130/53 | HR 79 | Temp 98.0°F | Resp 20 | Ht 58.5 in | Wt 159.5 lb

## 2015-02-21 DIAGNOSIS — R232 Flushing: Secondary | ICD-10-CM

## 2015-02-21 DIAGNOSIS — R51 Headache: Secondary | ICD-10-CM

## 2015-02-21 DIAGNOSIS — N951 Menopausal and female climacteric states: Secondary | ICD-10-CM

## 2015-02-21 DIAGNOSIS — R21 Rash and other nonspecific skin eruption: Secondary | ICD-10-CM

## 2015-02-21 DIAGNOSIS — Z0000 Encounter for general adult medical examination without abnormal findings: Secondary | ICD-10-CM

## 2015-02-21 DIAGNOSIS — E785 Hyperlipidemia, unspecified: Secondary | ICD-10-CM | POA: Diagnosis not present

## 2015-02-21 DIAGNOSIS — R202 Paresthesia of skin: Secondary | ICD-10-CM | POA: Diagnosis not present

## 2015-02-21 DIAGNOSIS — R519 Headache, unspecified: Secondary | ICD-10-CM

## 2015-02-21 MED ORDER — ESTRADIOL 2 MG PO TABS: 2.0000 mg | ORAL_TABLET | Freq: Every day | ORAL | Status: DC

## 2015-02-21 NOTE — Progress Notes (Signed)
   Subjective:    Patient ID: Christina Stevenson, female    DOB: [birth year], 69 y.o.   MRN: [id number]  HPI Pt is a 69 y/o female w/ PMH of parkinson's disease, depression, and HTN who presents to clinic for f/u on estrace refill. Please see problem list for further details.     Review of Systems  Constitutional: Negative for activity change and appetite change.  HENT:       Has difficulty swallowing, unchanged since 2013   Respiratory: Negative for shortness of breath.   Cardiovascular: Negative for chest pain and leg swelling.  Gastrointestinal: Negative for abdominal pain and abdominal distention.  Neurological: Positive for tremors and headaches (evening HAs). Negative for weakness and numbness.  Psychiatric/Behavioral: Negative for dysphoric mood.       Objective:   Physical Exam  Constitutional: She appears well-developed and well-nourished. No distress.  HENT:  Head: Normocephalic.  Cardiovascular: Normal rate and regular rhythm.   Pulmonary/Chest: Effort normal and breath sounds normal. No respiratory distress. She has no wheezes. She has no rales.  Abdominal: Soft. Bowel sounds are normal. There is no tenderness.  Musculoskeletal: She exhibits no edema.  Neurological: She is alert.  Skin: Skin is warm and dry.  2-3 Pink 1mm papules on rt medial wrist, 1 flat 1-112mm pink macule on left calf, non irritated          Assessment & Plan:  Please see problem based assessment and plan.

## 2015-02-22 ENCOUNTER — Other Ambulatory Visit: Payer: Self-pay | Admitting: *Deleted

## 2015-02-22 DIAGNOSIS — R202 Paresthesia of skin: Secondary | ICD-10-CM | POA: Insufficient documentation

## 2015-02-22 DIAGNOSIS — R519 Headache, unspecified: Secondary | ICD-10-CM | POA: Insufficient documentation

## 2015-02-22 DIAGNOSIS — R51 Headache: Secondary | ICD-10-CM

## 2015-02-22 HISTORY — DX: Paresthesia of skin: R20.2

## 2015-02-22 LAB — LIPID PANEL
CHOL/HDL RATIO: 2.8 ratio
Cholesterol: 193 mg/dL (ref 0–200)
HDL: 70 mg/dL (ref >=46)
LDL Cholesterol: 90 mg/dL (ref 0–99)
Triglycerides: 166 mg/dL — ABNORMAL HIGH (ref <150)
VLDL: 33 mg/dL (ref 0–40)

## 2015-02-22 MED ORDER — CLOBETASOL PROPIONATE 0.05 % EX OINT: 1.0000 "application " | TOPICAL_OINTMENT | Freq: Two times a day (BID) | CUTANEOUS | Status: DC

## 2015-02-22 NOTE — Assessment & Plan Note (Addendum)
Last LDL 131 two years ago. Repeat lipid panel today, LDL 90. Her 10 year ASCVD risk is 9.5%. A moderate to high intensity statin is recommended. Will discuss with patient at next visit, will get LFTs at that visit as well.

## 2015-02-22 NOTE — Assessment & Plan Note (Signed)
Pt is on estrace 2mg  that was started on 03/29/14 for hot flashes. Pt requested refill on estrace but I wanted to see her in clinic before refilling med again to ensure it was working and determine if it was appropriate to take her off this med. She states estrace is working for her and both her and her husband are adamant about not taking pt off of it. Husband states he would have to leave the house if she stops taking estrace. Prior to estrace pt states she would get flushing of left ear that would then spread all over along with mood changes.  - will continue refilling estrace. Pt has been on it for less than 1 year, per UTD pt's should not be on this for more than 5 years unless hot flashes affect quality of life. While she has not been on this med for more than 5 years, hot flashes are obviously affecting her quality of life. Will continue reassess need for estrace during visits.

## 2015-02-22 NOTE — Telephone Encounter (Signed)
Pt sends email requesting clobetaslo ointment, do not see on med list

## 2015-02-22 NOTE — Assessment & Plan Note (Signed)
Pt has hx of rash dating back to 2014 when she was prescribed lidex cream for plaques on her ankles. Pt states lidex cream did not help. Those have since gone away. She was also referred to Dr. Terri PiedraLupton w/dermatology and was prescribed a cream that the patient is unsure what the name of is but states it has helped with rash. Today she has small pink papules on wrist and on back of calves. Not plaques noted. Papules are itchy and appear to be dermatitis on my exam. Pt is going to send a mychart message with the name of the name of the cream she was prescribed and if appropriate I will refill it.

## 2015-02-22 NOTE — Assessment & Plan Note (Signed)
Mammogram in 2015 negative. Pt decline mammogram this year, prefers to wait until next year.

## 2015-02-22 NOTE — Assessment & Plan Note (Signed)
Pt reports pins/ needles sensation that started 2-3 weeks ago. She will get a sharp pin/needles sensation that occurs on randomly on all 4 extremities and will cause her to jerk. This has never happened to her before. She will get sharp sensation on one limb at a time that lasts a fleeting second. It does not bother her. She states she will get this sensation maybe twice a week. Denies decreased sensation or weakness. Unclear what the etiology of pins/needles sensation is, it occurs periodically and does not bother pt. It could be a muscle spasm or related to her parkinson's disease. She has had good glucose control in the past. Instructed pt to keep track of when she feels the pins/needle sensation so that we can review it at next visit. While unlikely diabetic neuropathy we can check an a1c at next visit of it continues. Can also get BMET to check for electrolyte abnormalities.

## 2015-02-22 NOTE — Assessment & Plan Note (Addendum)
Pt complaining of daily headaches that occur in the late afternoon. HA's resolve w/ advil and sleep. She does not take advil everyday. Unable to tell if HA is unilateral or bilateral. HAs start at neck. Not associated w/ photophobia or sensitive to noise. She has hx of neck sx and takes flexeril for chronic neck pain. Possible that HA are due to tension HAs from neck pain. She has glasses but does not wear them all the time, last eye exam was 4 years ago thus possible the HAs due to eye strain from out date glasses prescription. Since she does not take advil everyday for HA unlikely due to med overuse.   - pt is going to make an eye appt on her own, refused need for referral - instructed to keep HA diary for review at next visit.

## 2015-02-26 NOTE — Progress Notes (Signed)
Internal Medicine Clinic Attending  Case discussed with Dr. Truong at the time of the visit.  We reviewed the resident's history and exam and pertinent patient test results.  I agree with the assessment, diagnosis, and plan of care documented in the resident's note.  

## 2015-03-25 ENCOUNTER — Other Ambulatory Visit: Payer: Self-pay | Admitting: Neurology

## 2015-03-27 ENCOUNTER — Other Ambulatory Visit: Payer: Self-pay | Admitting: *Deleted

## 2015-03-27 ENCOUNTER — Encounter: Payer: Self-pay | Admitting: Internal Medicine

## 2015-03-28 MED ORDER — ESTRADIOL 2 MG PO TABS: 2.0000 mg | ORAL_TABLET | Freq: Every day | ORAL | Status: DC

## 2015-04-04 NOTE — Telephone Encounter (Signed)
You have been on the estrace  dose since December 2015. It has not been changed. However if you have been taking  daily and it has been working for you then continue taking  daily (1/2 tab of the  dose) and I can send in a prescription for  dose.   Dr. Danella Penton

## 2015-04-18 ENCOUNTER — Other Ambulatory Visit: Payer: Self-pay

## 2015-04-18 MED ORDER — ALPRAZOLAM 1 MG PO TABS: 1.0000 mg | ORAL_TABLET | Freq: Every day | ORAL | Status: DC

## 2015-04-18 NOTE — Telephone Encounter (Signed)
Originally prescribed per note from 01/21/13

## 2015-04-19 NOTE — Telephone Encounter (Signed)
Rx signed and faxed.

## 2015-06-11 ENCOUNTER — Ambulatory Visit (INDEPENDENT_AMBULATORY_CARE_PROVIDER_SITE_OTHER): Payer: Medicare Other

## 2015-06-11 DIAGNOSIS — Z23 Encounter for immunization: Secondary | ICD-10-CM

## 2015-06-20 ENCOUNTER — Other Ambulatory Visit: Payer: Self-pay | Admitting: Neurology

## 2015-08-08 ENCOUNTER — Ambulatory Visit (INDEPENDENT_AMBULATORY_CARE_PROVIDER_SITE_OTHER): Payer: Medicare Other | Admitting: Neurology

## 2015-08-08 ENCOUNTER — Encounter: Payer: Self-pay | Admitting: Neurology

## 2015-08-08 VITALS — BP 130/76 | HR 80 | Ht 60.0 in | Wt 158.0 lb

## 2015-08-08 DIAGNOSIS — G20 Parkinson's disease: Secondary | ICD-10-CM | POA: Diagnosis not present

## 2015-08-08 DIAGNOSIS — M47812 Spondylosis without myelopathy or radiculopathy, cervical region: Secondary | ICD-10-CM

## 2015-08-08 DIAGNOSIS — R519 Headache, unspecified: Secondary | ICD-10-CM

## 2015-08-08 DIAGNOSIS — R413 Other amnesia: Secondary | ICD-10-CM

## 2015-08-08 DIAGNOSIS — R51 Headache: Secondary | ICD-10-CM

## 2015-08-08 HISTORY — DX: Spondylosis without myelopathy or radiculopathy, cervical region: M47.812

## 2015-08-08 MED ORDER — GABAPENTIN 100 MG PO CAPS: 200.0000 mg | ORAL_CAPSULE | Freq: Every day | ORAL | Status: DC

## 2015-08-08 MED ORDER — ROPINIROLE HCL 3 MG PO TABS: ORAL_TABLET | ORAL | Status: DC

## 2015-08-08 NOTE — Progress Notes (Signed)
Reason for visit: Parkinson's disease  Christina Stevenson is an 69 y.o. female  History of present illness:  Christina Stevenson is a 69 year old right-handed white female with a history of Parkinson's disease. The patient has tremors primarily on the left upper extremity, the tremors appear to be somewhat worse in the evening hours. The patient has a history of cervical spondylosis, prior cervical spine surgery was done by Dr. Jeral Fruit. The patient will have some discomfort in the neck and shoulders in the evening hours, she indicates that this does not keep her from sleeping. The patient takes alprazolam at night for this purpose. She denies any significant balance issues, she has not had any falls. She does have some drooling at nighttime, but not during the day. She is trying to stay active, walking on a regular basis. She does have some hoarseness of speech at times. She denies that her mild memory disturbance has worsened over time. She operates a motor vehicle without difficulty, she has not given up any activities of daily living secondary to memory. The patient does report some occasional issues with choking while eating, this is not a frequent occurrence. She returns for an evaluation.  Past Medical History  Diagnosis Date  . Hypertension   . Uterovaginal prolapse, incomplete     with cystocele  . Spinal stenosis in cervical region   . Hyperlipemia   . Allergic rhinitis, cause unspecified   . Irritable bowel syndrome   . Bundle branch block, unspecified   . GERD (gastroesophageal reflux disease)     on bid omeprazole  . Depression 08/2009    multiple stressors: including son who has suffered 2 CVA's and is in a rehab facility, financial difficulties, loss of pets, etc.  . Osteoarthrosis, unspecified whether generalized or localized, unspecified site   . Parkinson disease (HCC) 06/02/12    Probable; being follwed by Dr. Anne Hahn of [facility], has been started on Requip  . Obesity   . Sciatica of  right side   . Depression   . Anxiety   . History of shingles   . Memory disturbance   . REM sleep behavior disorder   . HERPES ZOSTER 09/11/2009    Qualifier: Diagnosis of  By: Coralee Pesa MD, Levada Schilling   . Dermatitis   . Cervical spondylosis without myelopathy 08/08/2015    Past Surgical History  Procedure Laterality Date  . Abdominal hysterectomy    . Cervical laminectomy    . Lumbar laminectomy    . Trigger finger surgery Right     Thumb  . Bladder resuspension procedure      Family History  Problem Relation Age of Onset  . Cancer - Lung Sister   . Heart Problems Father     lived to be 27 yo    Social history:  reports that she has never smoked. She has never used smokeless tobacco. She reports that she does not drink alcohol or use illicit drugs.    Allergies  Allergen Reactions  . Prednisone     Swelling, emotionally volatile   . Latex Rash    Medications:  Prior to Admission medications   Medication Sig Start Date End Date Taking? Authorizing Provider  ALPRAZolam Prudy Feeler) 1 MG tablet Take 1 tablet (1 mg total) by mouth at bedtime. 04/18/15  Yes York Spaniel, MD  aspirin 81 MG tablet Take 81 mg by mouth daily.   Yes Historical Provider, MD  buPROPion (WELLBUTRIN XL) 150 MG 24 hr tablet Take  1 tablet (150 mg total) by mouth daily. 01/11/15  Yes Denton Brickiana M Truong, MD  calcium citrate-vitamin D (CITRACAL+D) 315-200 MG-UNIT per tablet Take 1 tablet by mouth daily.   Yes Historical Provider, MD  clobetasol ointment (TEMOVATE) 0.05 % Apply 1 application topically 2 (two) times daily. 02/22/15  Yes Denton Brickiana M Truong, MD  cyclobenzaprine (FLEXERIL) 10 MG tablet Take 1 tablet (10 mg total) by mouth 3 (three) times daily as needed for muscle spasms. 10/12/14  Yes York Spanielharles K Vennela Jutte, MD  diphenhydrAMINE (BENADRYL) 25 mg capsule Take 25 mg by mouth every 6 (six) hours as needed.   Yes Historical Provider, MD  pantoprazole (PROTONIX) 20 MG tablet TAKE 1 TABLET TWICE DAILY BEFORE BREAKFAST  AND DINNER. 01/11/15  Yes Denton Brickiana M Truong, MD  quinapril (ACCUPRIL) 20 MG tablet Take 1 tablet (20 mg total) by mouth daily. 01/11/15  Yes Denton Brickiana M Truong, MD  rOPINIRole (REQUIP) 3 MG tablet Take 1 tablet by mouth 3  times daily 06/20/15  Yes York Spanielharles K Rettie Laird, MD    ROS:  Out of a complete 14 system review of symptoms, the patient complains only of the following symptoms, and all other reviewed systems are negative.  Runny nose, difficulty swallowing, drooling Eye discharge, eye itching, eye redness, light sensitivity, blurred vision Choking Heart murmur Flushing Urinary urgency Acting out dreams Neck pain, neck stiffness Skin rash, itching Memory loss, headache, tremors Agitation, confusion, depression, anxiety  Blood pressure 130/76, pulse 80, height 5' (1.524 m), weight 158 lb (71.668 kg).  Physical Exam  General: The patient is alert and cooperative at the time of the examination.  Neuromuscular: Range of movement of the cervical spine is reduced by about 30 of lateral rotation bilaterally.  Skin: No significant peripheral edema is noted.   Neurologic Exam  Mental status: The patient is alert and oriented x 3 at the time of the examination. The patient has apparent normal recent and remote memory, with an apparently normal attention span and concentration ability. Mini-Mental Status Examination done today shows a total score of 30/30. The patient is able to name 15 animals in 30 seconds.   Cranial nerves: Facial symmetry is present. Speech is normal, no aphasia or dysarthria is noted. Extraocular movements are full. Visual fields are full. Mild masking of the face is seen.  Motor: The patient has good strength in all 4 extremities.  Sensory examination: Soft touch sensation is symmetric on the face, arms, and legs.  Coordination: The patient has good finger-nose-finger and heel-to-shin bilaterally. A resting tremor is noted on the left upper extremity.  Gait and station:  The patient has a normal gait. The patient has relatively good arm swing, she can arise from a seated position with arms crossed. Tandem gait is normal. Romberg is negative. No drift is seen.  Reflexes: Deep tendon reflexes are symmetric, but are somewhat brisk throughout.   Assessment/Plan:  1. Parkinson's disease  2. Cervical spondylosis  3. Mild memory disturbance  The patient is doing relatively well this time. We will keep her on the current dose of the Requip. The patient is having some discomfort in the shoulders at night, I will add low-dose gabapentin taking 200 mg in the evening. She will follow-up through this office in about 5 months.  Marlan Palau. Keith Hollye Pritt MD 08/08/2015 10:21 AM  [facility] [address] [place] [id number]  Phone [phone] Fax [phone]

## 2015-08-08 NOTE — Patient Instructions (Signed)
Parkinson Disease Parkinson disease is a disorder of the central nervous system, which includes the brain and spinal cord. A person with this disease slowly loses the ability to completely control body movements. Within the brain, there is a group of nerve cells (basal ganglia) that help control movement. The basal ganglia are damaged and do not work properly in a person with Parkinson disease. In addition, the basal ganglia produce and use a brain chemical called dopamine. The dopamine chemical sends messages to other parts of the body to control and coordinate body movements. Dopamine levels are low in a person with Parkinson disease. If the dopamine levels are low, then the body does not receive the correct messages it needs to move normally.  CAUSES  The exact reason why the basal ganglia get damaged is not known. Some medical researchers have thought that infection, genes, environment, and certain medicines may contribute to the cause.  SYMPTOMS   An early symptom of Parkinson disease is often an uncontrolled shaking (tremor) of the hands. The tremor will often disappear when the affected hand is consciously used.  As the disease progresses, walking, talking, getting out of a chair, and new movements become more difficult.  Muscles get stiff and movements become slower.  Balance and coordination become harder.  Depression, trouble swallowing, urinary problems, constipation, and sleep problems can occur.  Later in the disease, memory and thought processes may deteriorate. DIAGNOSIS  There are no specific tests to diagnose Parkinson disease. You may be referred to a neurologist for evaluation. Your caregiver will ask about your medical history, symptoms, and perform a physical exam. Blood tests and imaging tests of your brain may be performed to rule out other diseases. The imaging tests may include an MRI or a CT scan. TREATMENT  The goal of treatment is to relieve symptoms. Medicines may be  prescribed once the symptoms become troublesome. Medicine will not stop the progression of the disease, but medicine can make movement and balance better and help control tremors. Speech and occupational therapy may also be prescribed. Sometimes, surgical treatment of the brain can be done in young people. HOME CARE INSTRUCTIONS  Get regular exercise and rest periods during the day to help prevent exhaustion and depression.  If getting dressed becomes difficult, replace buttons and zippers with Velcro and elastic on your clothing.  Take all medicine as directed by your caregiver.  Install grab bars or railings in your home to prevent falls.  Go to speech or occupational therapy as directed.  Keep all follow-up visits as directed by your caregiver. SEEK MEDICAL CARE IF:  Your symptoms are not controlled with your medicine.  You fall.  You have trouble swallowing or choke on your food. MAKE SURE YOU:  Understand these instructions.  Will watch your condition.  Will get help right away if you are not doing well or get worse.   This information is not intended to replace advice given to you by your health care provider. Make sure you discuss any questions you have with your health care provider.   Document Released: 08/01/2000 Document Revised: 11/29/2012 Document Reviewed: 09/03/2011 [employer] Interactive Patient Education 2016 [employer]  

## 2015-09-26 ENCOUNTER — Other Ambulatory Visit: Payer: Self-pay | Admitting: Neurology

## 2015-10-18 ENCOUNTER — Encounter: Payer: Self-pay | Admitting: *Deleted

## 2015-10-18 ENCOUNTER — Other Ambulatory Visit: Payer: Self-pay | Admitting: *Deleted

## 2015-10-18 MED ORDER — ALPRAZOLAM 1 MG PO TABS
1.0000 mg | ORAL_TABLET | Freq: Every day | ORAL | Status: DC
Start: 2015-10-18 — End: 2016-04-15

## 2015-10-29 DIAGNOSIS — H2513 Age-related nuclear cataract, bilateral: Secondary | ICD-10-CM | POA: Diagnosis not present

## 2015-12-05 ENCOUNTER — Other Ambulatory Visit: Payer: Self-pay | Admitting: Internal Medicine

## 2015-12-05 NOTE — Telephone Encounter (Signed)
Last appt 02/21/15. Next appt 12/12/15 w/ Dr Danella Pentonruong.

## 2015-12-12 ENCOUNTER — Encounter: Payer: Self-pay | Admitting: Internal Medicine

## 2015-12-12 ENCOUNTER — Ambulatory Visit (INDEPENDENT_AMBULATORY_CARE_PROVIDER_SITE_OTHER): Payer: Medicare Other | Admitting: Internal Medicine

## 2015-12-12 VITALS — BP 128/61 | Temp 98.2°F | Ht 60.0 in | Wt 156.4 lb

## 2015-12-12 DIAGNOSIS — G20 Parkinson's disease: Secondary | ICD-10-CM

## 2015-12-12 DIAGNOSIS — J309 Allergic rhinitis, unspecified: Secondary | ICD-10-CM

## 2015-12-12 DIAGNOSIS — Z23 Encounter for immunization: Secondary | ICD-10-CM

## 2015-12-12 DIAGNOSIS — I10 Essential (primary) hypertension: Secondary | ICD-10-CM

## 2015-12-12 DIAGNOSIS — K219 Gastro-esophageal reflux disease without esophagitis: Secondary | ICD-10-CM

## 2015-12-12 DIAGNOSIS — M5030 Other cervical disc degeneration, unspecified cervical region: Secondary | ICD-10-CM

## 2015-12-12 DIAGNOSIS — J302 Other seasonal allergic rhinitis: Secondary | ICD-10-CM

## 2015-12-12 DIAGNOSIS — G4752 REM sleep behavior disorder: Secondary | ICD-10-CM

## 2015-12-12 DIAGNOSIS — E785 Hyperlipidemia, unspecified: Secondary | ICD-10-CM | POA: Diagnosis not present

## 2015-12-12 DIAGNOSIS — Z79899 Other long term (current) drug therapy: Secondary | ICD-10-CM

## 2015-12-12 DIAGNOSIS — Z0000 Encounter for general adult medical examination without abnormal findings: Secondary | ICD-10-CM

## 2015-12-12 MED ORDER — ATORVASTATIN CALCIUM 20 MG PO TABS: 20.0000 mg | ORAL_TABLET | Freq: Every day | ORAL | Status: DC

## 2015-12-12 MED ORDER — FEXOFENADINE HCL 30 MG PO TABS: 30.0000 mg | ORAL_TABLET | Freq: Two times a day (BID) | ORAL | Status: DC

## 2015-12-12 NOTE — Patient Instructions (Signed)
Start taking lipitor 20mg  for your cholesterol.   Start taking gabapentin 200mg  at night for your neck and shoulder pain.

## 2015-12-12 NOTE — Progress Notes (Addendum)
Subjective:   Christina Stevenson is a 70 y.o. female who presents for a Medicare Annual Wellness Visit.  The following items have been reviewed and updated today in the appropriate area in the EMR.   Health Risk Assessment  Height, weight, BMI, and BP Depression screen Fall risk / safety level Medical and family history were reviewed and updated Updating list of other providers & suppliers Medication reconciliation, including over the counter medicines Written screening schedule Risk Factor list Personalized health advice, risky behaviors, and treatment advice      Review of Systems  Respiratory: Negative for shortness of breath.   Cardiovascular: Negative for chest pain.  Gastrointestinal: Negative for nausea and abdominal pain.  Musculoskeletal: Positive for myalgias (neck and shoulder pain, chronic). Negative for falls.  Neurological: Negative for weakness.  Psychiatric/Behavioral: Positive for depression (stable).   Objective:    Vitals: BP 128/61 mmHg  Temp(Src) 98.2 F (36.8 C) (Oral)  Ht 5' (1.524 m)  Wt 156 lb 6.4 oz (70.943 kg)  BMI 30.54 kg/m2  SpO2 100% Physical Exam  Constitutional: She appears well-developed and well-nourished. No distress.  HENT:  Head: Normocephalic and atraumatic.  Nose: Nose normal.  Cardiovascular: Normal rate, regular rhythm and normal heart sounds.  Exam reveals no gallop and no friction rub.   No murmur heard. Pulmonary/Chest: Effort normal and breath sounds normal. No respiratory distress. She has no wheezes. She has no rales.  Abdominal: Soft. Bowel sounds are normal. She exhibits no distension. There is no tenderness. There is no rebound and no guarding.  Neurological:  Left hand tremor  Skin: Skin is warm and dry. No rash noted. She is not diaphoretic. No erythema. No pallor.    Activities of Daily Living In your present state of health, do you have any difficulty performing the following activities: 12/12/2015 02/21/2015    Hearing? N N  Vision? N Y  Difficulty concentrating or making decisions? N N  Walking or climbing stairs? N N  Dressing or bathing? N N  Doing errands, shopping? N N    Goals Goals    . Exercise 3x per week (30 min per time)    . Exercise 3x per week (30 min per time)    . LDL CALC < 160    . Prevent Falls       Fall Risk Fall Risk  12/12/2015 02/21/2015 07/19/2014 03/29/2014 07/06/2013  Falls in the past year? No No No Yes Yes  Number falls in past yr: - - - 2 or more 2 or more  Risk Factor Category  - - - High Fall Risk High Fall Risk  Risk for fall due to : - Impaired balance/gait;Medication side effect History of fall(s);Medication side effect History of fall(s) History of fall(s);Impaired balance/gait    Depression Screen PHQ 2/9 Scores 12/12/2015 02/21/2015 07/19/2014 03/29/2014  PHQ - 2 Score 2 1 0 1  PHQ- 9 Score 9 - - -       Assessment and Plan:    During the course of the visit the patient was educated and counseled about appropriate screening and preventive services as documented in the assessment and plan.  The printed AVS was given to the patient and included an updated screening schedule, a list of risk factors, and personalized health advice.    Essential hypertension BP Readings from Last 3 Encounters:  12/12/15 128/61  08/08/15 130/76  02/21/15 130/53    Lab Results  Component Value Date   NA 139 07/19/2014  K 4.0 07/19/2014   CREATININE 0.96 07/19/2014    Assessment: Blood pressure control: controlled Progress toward BP goal:  at goal Comments: on quinapril  qd  Plan: Medications:  continue current medications Educational resources provided:   Self management tools provided:   Other plans: f/u in 6 months - 1 year, can get BMET at next visit.    GERD Stable. continue protonix BID.  Parkinson disease Stable, follows Dr. Anne Hahn. On ropinerole for this. Has noticed more tremors in her left hand. She has been working out in the gym more  in addition to walking outside. Encouraged pt to keep this up.   HLD (hyperlipidemia) Discussed risks and benefits of statins as well as her ASCVD 10 year risk of 9.5%. She would like to try a statin.  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 Requesting to go up on this. On further question patient has difficulty sleeping due to neck and shoulder pain which is from her cervical DDD. Dr. Anne Hahn prescribed her gabapentin  qhs for this which she has not been taking. Advised her to try gabapentin at night to help w/ pain and sleep. Also discussed sleep hygiene.  - CTM   Allergic rhinitis Stable, refilled allegra.   Preventative health care Declines colonoscopy, states she had a bad experience in the past with it. Given stool cards and prevnar 13 vaccine today.       Gara Kroner, MD  12/14/2015

## 2015-12-14 NOTE — Assessment & Plan Note (Signed)
On xanax 1 mg for insomnia. Requesting to go up on this. On further question patient has difficulty sleeping due to neck and shoulder pain which is from her cervical DDD. Dr. Anne HahnWillis prescribed her gabapentin 200mg  qhs for this which she has not been taking. Advised her to try gabapentin at night to help w/ pain and sleep. Also discussed sleep hygiene.  - CTM

## 2015-12-14 NOTE — Addendum Note (Signed)
Addended by: Denton BrickRUONG, Polk Minor M on: 12/14/2015 12:07 PM   Modules accepted: Level of Service

## 2015-12-14 NOTE — Assessment & Plan Note (Signed)
Stable. continue protonix BID.

## 2015-12-14 NOTE — Assessment & Plan Note (Signed)
Declines colonoscopy, states she had a bad experience in the past with it. Given stool cards and prevnar 13 vaccine today.

## 2015-12-14 NOTE — Assessment & Plan Note (Signed)
Stable, follows Dr. Anne HahnWillis. On ropinerole for this. Has noticed more tremors in her left hand. She has been working out in the gym more in addition to walking outside. Encouraged pt to keep this up.

## 2015-12-14 NOTE — Assessment & Plan Note (Signed)
Stable, refilled allegra.

## 2015-12-14 NOTE — Assessment & Plan Note (Addendum)
BP Readings from Last 3 Encounters:  12/12/15 128/61  08/08/15 130/76  02/21/15 130/53    Lab Results  Component Value Date   NA 139 07/19/2014   K 4.0 07/19/2014   CREATININE 0.96 07/19/2014    Assessment: Blood pressure control: controlled Progress toward BP goal:  at goal Comments: on quinapril 20mg  qd  Plan: Medications:  continue current medications Educational resources provided:   Self management tools provided:   Other plans: f/u in 6 months - 1 year, can get BMET at next visit.

## 2015-12-14 NOTE — Assessment & Plan Note (Signed)
Discussed risks and benefits of statins as well as her ASCVD 10 year risk of 9.5%. She would like to try a statin.  -rx for lipitor 20mg  daily

## 2015-12-16 NOTE — Progress Notes (Signed)
Case discussed with Dr. Truong at the time of the visit.  We reviewed the resident's history and exam and pertinent patient test results.  I agree with the assessment, diagnosis, and plan of care documented in the resident's note. 

## 2016-01-07 ENCOUNTER — Encounter: Payer: Self-pay | Admitting: Neurology

## 2016-01-07 ENCOUNTER — Ambulatory Visit (INDEPENDENT_AMBULATORY_CARE_PROVIDER_SITE_OTHER): Payer: Medicare Other | Admitting: Neurology

## 2016-01-07 VITALS — BP 106/64 | HR 80 | Ht 60.0 in | Wt 153.0 lb

## 2016-01-07 DIAGNOSIS — R413 Other amnesia: Secondary | ICD-10-CM

## 2016-01-07 DIAGNOSIS — G20 Parkinson's disease: Secondary | ICD-10-CM

## 2016-01-07 MED ORDER — ROPINIROLE HCL 3 MG PO TABS: ORAL_TABLET | ORAL | Status: DC

## 2016-01-07 NOTE — Patient Instructions (Signed)
Parkinson Disease Parkinson disease is a disorder of the central nervous system, which includes the brain and spinal cord. A person with this disease slowly loses the ability to completely control body movements. Within the brain, there is a group of nerve cells (basal ganglia) that help control movement. The basal ganglia are damaged and do not work properly in a person with Parkinson disease. In addition, the basal ganglia produce and use a brain chemical called dopamine. The dopamine chemical sends messages to other parts of the body to control and coordinate body movements. Dopamine levels are low in a person with Parkinson disease. If the dopamine levels are low, then the body does not receive the correct messages it needs to move normally.  CAUSES  The exact reason why the basal ganglia get damaged is not known. Some medical researchers have thought that infection, genes, environment, and certain medicines may contribute to the cause.  SYMPTOMS   An early symptom of Parkinson disease is often an uncontrolled shaking (tremor) of the hands. The tremor will often disappear when the affected hand is consciously used.  As the disease progresses, walking, talking, getting out of a chair, and new movements become more difficult.  Muscles get stiff and movements become slower.  Balance and coordination become harder.  Depression, trouble swallowing, urinary problems, constipation, and sleep problems can occur.  Later in the disease, memory and thought processes may deteriorate. DIAGNOSIS  There are no specific tests to diagnose Parkinson disease. You may be referred to a neurologist for evaluation. Your caregiver will ask about your medical history, symptoms, and perform a physical exam. Blood tests and imaging tests of your brain may be performed to rule out other diseases. The imaging tests may include an MRI or a CT scan. TREATMENT  The goal of treatment is to relieve symptoms. Medicines may be  prescribed once the symptoms become troublesome. Medicine will not stop the progression of the disease, but medicine can make movement and balance better and help control tremors. Speech and occupational therapy may also be prescribed. Sometimes, surgical treatment of the brain can be done in young people. HOME CARE INSTRUCTIONS  Get regular exercise and rest periods during the day to help prevent exhaustion and depression.  If getting dressed becomes difficult, replace buttons and zippers with Velcro and elastic on your clothing.  Take all medicine as directed by your caregiver.  Install grab bars or railings in your home to prevent falls.  Go to speech or occupational therapy as directed.  Keep all follow-up visits as directed by your caregiver. SEEK MEDICAL CARE IF:  Your symptoms are not controlled with your medicine.  You fall.  You have trouble swallowing or choke on your food. MAKE SURE YOU:  Understand these instructions.  Will watch your condition.  Will get help right away if you are not doing well or get worse.   This information is not intended to replace advice given to you by your health care provider. Make sure you discuss any questions you have with your health care provider.   Document Released: 08/01/2000 Document Revised: 11/29/2012 Document Reviewed: 09/03/2011 [employer] Interactive Patient Education 2016 [employer]  

## 2016-01-07 NOTE — Progress Notes (Signed)
Reason for visit: Parkinson's disease  Christina Stevenson is an 70 y.o. female  History of present illness:  Christina Stevenson is a 70 year old right-handed white female with a history of Parkinson's disease. The patient has tremors bilaterally of the upper extremities. This has gradually worsened over time. The patient denies any change in her overall functional level, she has not had any falls, but she will stumble on occasion. She denies any significant issues with choking, but this does occur on occasion. She has had a mild memory disturbance, she does not believe that there has been any significant change in her memory since last seen. The patient is trying to stay active, she will walk on a regular basis. She returns to the office today for an evaluation. She remains on Requip 3 mg 3 times daily.  Past Medical History  Diagnosis Date  . Hypertension   . Uterovaginal prolapse, incomplete     with cystocele  . Spinal stenosis in cervical region   . Hyperlipemia   . Allergic rhinitis, cause unspecified   . Irritable bowel syndrome   . Bundle branch block, unspecified   . GERD (gastroesophageal reflux disease)     on bid omeprazole  . Depression 08/2009    multiple stressors: including son who has suffered 2 CVA's and is in a rehab facility, financial difficulties, loss of pets, etc.  . Osteoarthrosis, unspecified whether generalized or localized, unspecified site   . Parkinson disease (HCC) 06/02/12    Probable; being follwed by Dr. Anne Hahn of [facility], has been started on Requip  . Obesity   . Sciatica of right side   . Depression   . Anxiety   . History of shingles   . Memory disturbance   . REM sleep behavior disorder   . HERPES ZOSTER 09/11/2009    Qualifier: Diagnosis of  By: Coralee Pesa MD, Levada Schilling   . Dermatitis   . Cervical spondylosis without myelopathy 08/08/2015    Past Surgical History  Procedure Laterality Date  . Abdominal hysterectomy    . Cervical laminectomy    .  Lumbar laminectomy    . Trigger finger surgery Right     Thumb  . Bladder resuspension procedure      Family History  Problem Relation Age of Onset  . Cancer - Lung Sister   . Heart Problems Father     lived to be 52 yo    Social history:  reports that she has never smoked. She has never used smokeless tobacco. She reports that she does not drink alcohol or use illicit drugs.    Allergies  Allergen Reactions  . Prednisone     Swelling, emotionally volatile   . Latex Rash    Medications:  Prior to Admission medications   Medication Sig Start Date End Date Taking? Authorizing Provider  ALPRAZolam Prudy Feeler) 1 MG tablet Take 1 tablet (1 mg total) by mouth at bedtime. 10/18/15  Yes York Spaniel, MD  aspirin 81 MG tablet Take 81 mg by mouth daily.   Yes Historical Provider, MD  atorvastatin (LIPITOR) 20 MG tablet Take 1 tablet (20 mg total) by mouth daily. 12/12/15 12/11/16 Yes Denton Brick, MD  buPROPion (WELLBUTRIN XL) 150 MG 24 hr tablet Take 1 tablet by mouth  daily 12/05/15  Yes Denton Brick, MD  calcium citrate-vitamin D (CITRACAL+D) 315-200 MG-UNIT per tablet Take 1 tablet by mouth daily.   Yes Historical Provider, MD  clobetasol ointment (TEMOVATE) 0.05 % Apply  1 application topically 2 (two) times daily. 02/22/15  Yes Denton Brickiana M Truong, MD  cyclobenzaprine (FLEXERIL) 10 MG tablet Take 1 tablet (10 mg total) by mouth 3 (three) times daily as needed for muscle spasms. 10/12/14  Yes York Spanielharles K Willis, MD  diphenhydrAMINE (BENADRYL) 25 mg capsule Take 25 mg by mouth every 6 (six) hours as needed.   Yes Historical Provider, MD  fexofenadine (ALLEGRA) 30 MG tablet Take 1 tablet (30 mg total) by mouth 2 (two) times daily. 12/12/15  Yes Denton Brickiana M Truong, MD  pantoprazole (PROTONIX) 20 MG tablet TAKE 1 TABLET BY MOUTH  TWICE DAILY BEFORE  BREAKFAST AND DINNER. 12/05/15  Yes Denton Brickiana M Truong, MD  quinapril (ACCUPRIL) 20 MG tablet Take 1 tablet (20 mg total) by mouth daily. 01/11/15  Yes Denton Brickiana M  Truong, MD  rOPINIRole (REQUIP) 3 MG tablet Take 1 tablet by mouth 3  times daily 08/08/15  Yes York Spanielharles K Willis, MD    ROS:  Out of a complete 14 system review of symptoms, the patient complains only of the following symptoms, and all other reviewed systems are negative.  Fatigue Runny nose, difficulty swallowing, drooling Eye discharge, eye itching, eye redness Choking Heat intolerance, flushing Constipation Tremor  Blood pressure 106/64, pulse 80, height 5' (1.524 m), weight 153 lb (69.4 kg).  Physical Exam  General: The patient is alert and cooperative at the time of the examination.  Neuromuscular: The patient has a slightly stooped posture, the left shoulder is elevated relative to the right.  Skin: No significant peripheral edema is noted.   Neurologic Exam  Mental status: The patient is alert and oriented x 3 at the time of the examination. The patient has apparent normal recent and remote memory, with an apparently normal attention span and concentration ability. Mini-Mental Status Examination done today shows a total score 29/30.   Cranial nerves: Facial symmetry is present. Speech is normal, no aphasia or dysarthria is noted. Extraocular movements are full. Visual fields are full. Masking of the face is seen.  Motor: The patient has good strength in all 4 extremities.  Sensory examination: Soft touch sensation is symmetric on the face, arms, and legs.  Coordination: The patient has good finger-nose-finger and heel-to-shin bilaterally. Resting tremors are noted on both upper extremities.  Gait and station: The patient has a normal gait. Tandem gait is normal. Romberg is negative. No drift is seen.  Reflexes: Deep tendon reflexes are symmetric.   Assessment/Plan:  1. Parkinson's disease  2. Mild memory disturbance  The patient does not wish to go on medications for memory at this time. She seems to be relatively stable with her functional level, we will  keep her on the same dose of Requip for now, she will follow-up in 5 months. She may try low-dose Benadryl for her tremors. She will contact our office if any new issues come up.  Marlan Palau. Keith Willis MD 01/07/2016 7:40 PM  [facility] [address] [place], [place] [id number]  Phone [phone] Fax [phone]

## 2016-01-16 ENCOUNTER — Other Ambulatory Visit: Payer: Self-pay | Admitting: Internal Medicine

## 2016-01-16 NOTE — Telephone Encounter (Signed)
Last appt 4/26 ; no f/u appt scheduled.

## 2016-01-16 NOTE — Telephone Encounter (Signed)
Last appt 4/26.

## 2016-03-18 ENCOUNTER — Other Ambulatory Visit: Payer: Self-pay | Admitting: Neurology

## 2016-04-15 ENCOUNTER — Telehealth: Payer: Self-pay

## 2016-04-15 MED ORDER — ALPRAZOLAM 1 MG PO TABS: 1.0000 mg | ORAL_TABLET | Freq: Every day | ORAL | 1 refills | Status: DC

## 2016-04-15 NOTE — Telephone Encounter (Signed)
Refill request fax to us for Alprazolam to be sent to [facility]

## 2016-04-15 NOTE — Telephone Encounter (Signed)
A prescription for alprazolam was written. 

## 2016-05-06 ENCOUNTER — Other Ambulatory Visit: Payer: Self-pay | Admitting: Neurology

## 2016-05-13 ENCOUNTER — Encounter: Payer: Self-pay | Admitting: Internal Medicine

## 2016-05-15 ENCOUNTER — Ambulatory Visit (INDEPENDENT_AMBULATORY_CARE_PROVIDER_SITE_OTHER): Payer: Medicare Other | Admitting: Internal Medicine

## 2016-05-15 VITALS — BP 126/44 | HR 76 | Temp 98.1°F | Ht 60.0 in | Wt 153.1 lb

## 2016-05-15 DIAGNOSIS — R300 Dysuria: Secondary | ICD-10-CM | POA: Diagnosis not present

## 2016-05-15 DIAGNOSIS — Z23 Encounter for immunization: Secondary | ICD-10-CM | POA: Diagnosis not present

## 2016-05-15 DIAGNOSIS — R1011 Right upper quadrant pain: Secondary | ICD-10-CM | POA: Diagnosis not present

## 2016-05-15 DIAGNOSIS — R1013 Epigastric pain: Secondary | ICD-10-CM | POA: Insufficient documentation

## 2016-05-15 DIAGNOSIS — Z0000 Encounter for general adult medical examination without abnormal findings: Secondary | ICD-10-CM

## 2016-05-15 LAB — POCT URINALYSIS DIPSTICK
BILIRUBIN UA: NEGATIVE
GLUCOSE UA: NEGATIVE
Ketones, UA: NEGATIVE
NITRITE UA: POSITIVE
Protein, UA: NEGATIVE
Spec Grav, UA: 1.02
UROBILINOGEN UA: 0.2
pH, UA: 5.5

## 2016-05-15 MED ORDER — NITROFURANTOIN MONOHYD MACRO 100 MG PO CAPS: 100.0000 mg | ORAL_CAPSULE | Freq: Two times a day (BID) | ORAL | 0 refills | Status: AC

## 2016-05-15 NOTE — Assessment & Plan Note (Addendum)
Endorsing intermittent dysuria, urgency as well as epigastric abdominal pain, mild nausea, and right flank pain over the last month. Her symptoms began to improve but then returned again this past weekend with abdominal pain and nausea, she noticed dark / orange urine at that time which was very concerning to her. She endorses taking intermittent advil (3 tablets every few days) mostly for chronic neck pains, has not tried other medications or pyridium. She denies fevers, chills. No abdominal pain, CVA tenderness or suprapubic pain on exam.  Plan: - Urine dipstick, U/A w reflex microscopic, culture - CBC, CMP given NSAID use and intermittent epigastric pain that may be unrelated to urinary symptoms.  Addendum: - Dipstick showed 3+ leuks with positive nitrite highly suggestive of UTI - Prescribed Macrobid 100mg  BID for 5 days, will follow culture results

## 2016-05-15 NOTE — Assessment & Plan Note (Signed)
Provided flu vaccine 

## 2016-05-15 NOTE — Patient Instructions (Signed)
We have taken samples of your urine and blood today. You may have a UTI that is the cause of your symptoms.  We will call to notify you of any abnormal results and we may provide a prescription for antibiotics at that time.  Please call or return if you develop fevers or your symptoms become worse.  Thank you for visiting us today!

## 2016-05-15 NOTE — Assessment & Plan Note (Addendum)
Intermittent epigastric and RUQ abdominal pain, occurred at it worst 5 days ago overnight but has since improved. At that time she had multiple episodes of vomiting but none since. Endorses sparing NSAID use for chronic neck pain. May have been single episode gastritis vs food poisoning.   Plan: - Check CBC and CMP

## 2016-05-15 NOTE — Progress Notes (Signed)
   CC: Intermittent dysuria and abdominal pain  HPI:  [name] is a 70 y.o. female with PMHx detailed below presenting with about one month of intermittent dysuria, epigastric/RUQ abdominal pain, and right flank pain. She has had mild persistent nausea and decreased appetite during this time as well. He symptoms improved approximately two weeks ago but then returned this past weekend.  See problem based assessment and plan below for additional details.  Past Medical History:  Diagnosis Date  . Allergic rhinitis, cause unspecified   . Anxiety   . Bundle branch block, unspecified   . Cervical spondylosis without myelopathy 08/08/2015  . Depression 08/2009   multiple stressors: including son who has suffered 2 CVA's and is in a rehab facility, financial difficulties, loss of pets, etc.  . Depression   . Dermatitis   . GERD (gastroesophageal reflux disease)    on bid omeprazole  . HERPES ZOSTER 09/11/2009   Qualifier: Diagnosis of  By: Coralee PesaWoodyear MD, Levada SchillingWynne E   . History of shingles   . Hyperlipemia   . Hypertension   . Irritable bowel syndrome   . Memory disturbance   . Obesity   . Osteoarthrosis, unspecified whether generalized or localized, unspecified site   . Parkinson disease (HCC) 06/02/12   Probable; being follwed by Dr. Anne HahnWillis of [facility], has been started on Requip  . REM sleep behavior disorder   . Sciatica of right side   . Spinal stenosis in cervical region   . Uterovaginal prolapse, incomplete    with cystocele    Review of Systems: Review of Systems  Constitutional: Positive for malaise/fatigue. Negative for chills and fever.  Respiratory: Positive for cough. Negative for shortness of breath.   Cardiovascular: Negative for chest pain.  Gastrointestinal: Positive for heartburn and nausea. Negative for abdominal pain, blood in stool, constipation and diarrhea.  Genitourinary: Positive for dysuria and urgency. Negative for frequency.  Neurological: Positive for  dizziness.     Physical Exam: Vitals:   05/15/16 0850  BP: (!) 126/44  Pulse: 76  Temp: 98.1 F (36.7 C)  TempSrc: Oral  SpO2: 100%  Weight: 153 lb 1.6 oz (69.4 kg)  Height: 5' (1.524 m)   Body mass index is 29.9 kg/m. GENERAL- Elderly woman sitting comfortably in exam room chair, alert, in no distress HEENT- Atraumatic, PERRL, EOMI, moist mucous membranes, good dentition, no carotid bruit, no cervical lymphadenopathy CARDIAC- Regular rate and rhythm, no murmurs, rubs or gallops. RESP- Clear to ascultation bilaterally, no wheezing or crackles, normal work of breathing ABDOMEN- Normoactive bowel sounds, soft, nontender, nondistended BACK- Kyphotic curvature, no paraspinal tenderness, no CVA tenderness. NEURO- Alert and oriented, cranial nerves grossly intact, resting tremor of hands EXTREMITIES- Normal bulk and range of motion, no edema, 2+ peripheral pulses SKIN- Warm, dry, intact, without visible rash PSYCH- Appropriate affect, clear speech, thoughts linear and goal-directed  Assessment & Plan:   See encounters tab for problem based medical decision making.  Patient seen with Dr. Heide Stevenson

## 2016-05-16 LAB — CBC
HEMATOCRIT: 36.2 % (ref 34.0–46.6)
HEMOGLOBIN: 12.6 g/dL (ref 11.1–15.9)
MCH: 29.9 pg (ref 26.6–33.0)
MCHC: 34.8 g/dL (ref 31.5–35.7)
MCV: 86 fL (ref 79–97)
Platelets: 172 10*3/uL (ref 150–379)
RBC: 4.22 x10E6/uL (ref 3.77–5.28)
RDW: 13.7 % (ref 12.3–15.4)
WBC: 3.7 10*3/uL (ref 3.4–10.8)

## 2016-05-16 LAB — CMP14 + ANION GAP
A/G RATIO: 1.3 (ref 1.2–2.2)
ALBUMIN: 4.2 g/dL (ref 3.6–4.8)
ALT: 150 IU/L — AB (ref 0–32)
AST: 58 IU/L — ABNORMAL HIGH (ref 0–40)
Alkaline Phosphatase: 176 IU/L — ABNORMAL HIGH (ref 39–117)
Anion Gap: 18 mmol/L (ref 10.0–18.0)
BILIRUBIN TOTAL: 1.1 mg/dL (ref 0.0–1.2)
BUN / CREAT RATIO: 16 (ref 12–28)
BUN: 16 mg/dL (ref 8–27)
CHLORIDE: 100 mmol/L (ref 96–106)
CO2: 24 mmol/L (ref 18–29)
Calcium: 9.3 mg/dL (ref 8.7–10.3)
Creatinine, Ser: 1.03 mg/dL — ABNORMAL HIGH (ref 0.57–1.00)
GFR calc non Af Amer: 56 mL/min/{1.73_m2} — ABNORMAL LOW (ref >59)
GFR, EST AFRICAN AMERICAN: 64 mL/min/{1.73_m2} (ref >59)
GLOBULIN, TOTAL: 3.3 g/dL (ref 1.5–4.5)
Glucose: 113 mg/dL — ABNORMAL HIGH (ref 65–99)
POTASSIUM: 4.3 mmol/L (ref 3.5–5.2)
SODIUM: 142 mmol/L (ref 134–144)
TOTAL PROTEIN: 7.5 g/dL (ref 6.0–8.5)

## 2016-05-16 LAB — MICROSCOPIC EXAMINATION
CASTS: NONE SEEN /LPF
WBC, UA: >30 /hpf — AB (ref 0–?)

## 2016-05-16 LAB — URINALYSIS, ROUTINE W REFLEX MICROSCOPIC
Bilirubin, UA: NEGATIVE
Glucose, UA: NEGATIVE
Ketones, UA: NEGATIVE
Nitrite, UA: NEGATIVE
PH UA: 5.5 (ref 5.0–7.5)
PROTEIN UA: NEGATIVE
Specific Gravity, UA: 1.016 (ref 1.005–1.030)
Urobilinogen, Ur: 1 mg/dL (ref 0.2–1.0)

## 2016-05-17 LAB — URINE CULTURE

## 2016-05-19 NOTE — Progress Notes (Signed)
Internal Medicine Clinic Attending  I saw and evaluated the patient.  I personally confirmed the key portions of the history and exam documented by Dr. Johnson and I reviewed pertinent patient test results.  The assessment, diagnosis, and plan were formulated together and I agree with the documentation in the resident's note.  

## 2016-06-18 ENCOUNTER — Ambulatory Visit (INDEPENDENT_AMBULATORY_CARE_PROVIDER_SITE_OTHER): Payer: Medicare Other | Admitting: Neurology

## 2016-06-18 ENCOUNTER — Encounter: Payer: Self-pay | Admitting: Neurology

## 2016-06-18 VITALS — BP 145/71 | HR 80 | Ht 60.0 in | Wt 153.0 lb

## 2016-06-18 DIAGNOSIS — R413 Other amnesia: Secondary | ICD-10-CM | POA: Diagnosis not present

## 2016-06-18 DIAGNOSIS — G20 Parkinson's disease: Secondary | ICD-10-CM | POA: Diagnosis not present

## 2016-06-18 MED ORDER — CARBIDOPA-LEVODOPA 25-100 MG PO TABS: 0.5000 | ORAL_TABLET | Freq: Two times a day (BID) | ORAL | 5 refills | Status: DC

## 2016-06-18 NOTE — Patient Instructions (Signed)
   Sinemet (carbidopa) may result in confusion or hallucinations, drowsiness, nausea, or dizziness. If any significant side effects are noted, please contact our office. Sinemet may not be well absorbed when taken with high protein meals, if tolerated it is best to take 30-45 minutes before you eat.  

## 2016-06-18 NOTE — Progress Notes (Signed)
Reason for visit: Parkinson's disease  Christina Stevenson is an 70 y.o. female  History of present illness:  Christina Stevenson is a 70 year old right-handed white female with a history of Parkinson's disease. The patient has bilateral upper extremity tremors, right greater than left. The patient is on Requip taking 3 mg 3 times daily. She does report a mild memory disturbance, mainly difficulty remembering names for people. She does not believe that the memory has changed much since last seen. The patient has had one fall since last seen, she slipped on a wet floor. The patient did not sustain injury. She does report some problems with choking with swallowing on occasion. The patient is sleeping well at night. She has been walking regularly up to a mile a day. She has just recently gotten into a Parkinson's exercise class. She returns for an evaluation.  Past Medical History:  Diagnosis Date  . Allergic rhinitis, cause unspecified   . Anxiety   . Bundle branch block, unspecified   . Cervical spondylosis without myelopathy 08/08/2015  . Depression 08/2009   multiple stressors: including son who has suffered 2 CVA's and is in a rehab facility, financial difficulties, loss of pets, etc.  . Depression   . Dermatitis   . GERD (gastroesophageal reflux disease)    on bid omeprazole  . HERPES ZOSTER 09/11/2009   Qualifier: Diagnosis of  By: Coralee PesaWoodyear MD, Levada SchillingWynne E   . History of shingles   . Hyperlipemia   . Hypertension   . Irritable bowel syndrome   . Memory disturbance   . Obesity   . Osteoarthrosis, unspecified whether generalized or localized, unspecified site   . Parkinson disease (HCC) 06/02/12   Probable; being follwed by Dr. Anne HahnWillis of [facility], has been started on Requip  . REM sleep behavior disorder   . Sciatica of right side   . Spinal stenosis in cervical region   . Uterovaginal prolapse, incomplete    with cystocele    Past Surgical History:  Procedure Laterality Date  . ABDOMINAL  HYSTERECTOMY    . bladder resuspension procedure    . CERVICAL LAMINECTOMY    . LUMBAR LAMINECTOMY    . Trigger finger surgery Right    Thumb    Family History  Problem Relation Age of Onset  . Heart Problems Father     lived to be 69 yo  . Cancer - Lung Sister     Social history:  reports that she has never smoked. She has never used smokeless tobacco. She reports that she does not drink alcohol or use drugs.    Allergies  Allergen Reactions  . Prednisone     Swelling, emotionally volatile   . Latex Rash    Medications:  Prior to Admission medications   Medication Sig Start Date End Date Taking? Authorizing Provider  ALPRAZolam Prudy Feeler(XANAX) 1 MG tablet Take 1 tablet (1 mg total) by mouth at bedtime. 04/15/16  Yes York Spanielharles K Enslee Bibbins, MD  atorvastatin (LIPITOR) 20 MG tablet Take 1 tablet (20 mg total) by mouth daily. 12/12/15 12/11/16 Yes Denton Brickiana M Truong, MD  clobetasol ointment (TEMOVATE) 0.05 % apply to affected area twice a day 01/17/16  Yes Denton Brickiana M Truong, MD  cyclobenzaprine (FLEXERIL) 10 MG tablet Take 1 tablet (10 mg total) by mouth 3 (three) times daily as needed for muscle spasms. 10/12/14  Yes York Spanielharles K Liela Rylee, MD  ibuprofen (ADVIL,MOTRIN) 200 MG tablet Take 200 mg by mouth every 6 (six) hours as needed.  Yes Historical Provider, MD  pantoprazole (PROTONIX) 20 MG tablet TAKE 1 TABLET BY MOUTH  TWICE DAILY BEFORE  BREAKFAST AND DINNER. 12/05/15  Yes Denton Brickiana M Truong, MD  quinapril (ACCUPRIL) 20 MG tablet Take 1 tablet by mouth  daily 01/17/16  Yes Denton Brickiana M Truong, MD  rOPINIRole (REQUIP) 3 MG tablet Take 1 tablet by mouth 3  times daily 05/06/16  Yes York Spanielharles K Lane Eland, MD  diphenhydrAMINE (BENADRYL) 25 mg capsule Take 25 mg by mouth every 6 (six) hours as needed.    Historical Provider, MD    ROS:  Out of a complete 14 system review of symptoms, the patient complains only of the following symptoms, and all other reviewed systems are negative.  Tremor  Blood pressure (!) 145/71, pulse  80, height 5' (1.524 m), weight 153 lb (69.4 kg).  Physical Exam  General: The patient is alert and cooperative at the time of the examination.  Skin: No significant peripheral edema is noted.   Neurologic Exam  Mental status: The patient is alert and oriented x 3 at the time of the examination. The patient has apparent normal recent and remote memory, with an apparently normal attention span and concentration ability. Mini-Mental Status Examination done today shows a total score of 30/30.   Cranial nerves: Facial symmetry is present. Speech is normal, no aphasia or dysarthria is noted. Extraocular movements are full. Visual fields are full. Masking of the face is seen.  Motor: The patient has good strength in all 4 extremities.  Sensory examination: Soft touch sensation is symmetric on the face, arms, and legs.  Coordination: The patient has good finger-nose-finger and heel-to-shin bilaterally. Resting tremors were noted on both upper extremities.  Gait and station: The patient was able to arise from a seated position with the arms crossed. Once up, the patient is able to ambulate independently. Decreased arm swing is noted on the right, bilateral arm tremors are seen. Tandem gait is normal. Romberg is negative. No drift is seen.  Reflexes: Deep tendon reflexes are symmetric.   Assessment/Plan:  1. Parkinson's disease  2. Mild memory disorder  The patient is to remain on the Requip, I will add low-dose Sinemet, the patient will take 25/100 mg Sinemet tablets, one half tablet twice daily. The patient will follow-up in 5 months. She will contact me if any problems arise.  Marlan Palau. Keith Olga Seyler MD 06/18/2016 9:46 AM  [facility] [address] [place], [place] [id number]  Phone [phone] Fax [phone]

## 2016-07-14 ENCOUNTER — Encounter: Payer: Self-pay | Admitting: Internal Medicine

## 2016-07-17 ENCOUNTER — Encounter: Payer: Self-pay | Admitting: Internal Medicine

## 2016-10-17 ENCOUNTER — Other Ambulatory Visit: Payer: Self-pay | Admitting: Neurology

## 2016-10-17 NOTE — Telephone Encounter (Signed)
Faxed printed/signed rx xanax to pt pharmacy. Fax: [phone]. Received confirmation.

## 2016-11-12 ENCOUNTER — Ambulatory Visit (INDEPENDENT_AMBULATORY_CARE_PROVIDER_SITE_OTHER): Payer: Medicare Other | Admitting: Internal Medicine

## 2016-11-12 ENCOUNTER — Encounter: Payer: Self-pay | Admitting: Internal Medicine

## 2016-11-12 VITALS — BP 125/55 | HR 70 | Temp 98.5°F | Wt 155.8 lb

## 2016-11-12 DIAGNOSIS — N390 Urinary tract infection, site not specified: Secondary | ICD-10-CM

## 2016-11-12 DIAGNOSIS — N393 Stress incontinence (female) (male): Secondary | ICD-10-CM

## 2016-11-12 DIAGNOSIS — N3000 Acute cystitis without hematuria: Secondary | ICD-10-CM | POA: Diagnosis not present

## 2016-11-12 DIAGNOSIS — Z87440 Personal history of urinary (tract) infections: Secondary | ICD-10-CM | POA: Diagnosis not present

## 2016-11-12 DIAGNOSIS — Z0000 Encounter for general adult medical examination without abnormal findings: Secondary | ICD-10-CM

## 2016-11-12 LAB — POCT URINALYSIS DIPSTICK
BILIRUBIN UA: NEGATIVE
GLUCOSE UA: NEGATIVE
Ketones, UA: NEGATIVE
NITRITE UA: POSITIVE
PH UA: 6 (ref 5.0–8.0)
Protein, UA: NEGATIVE
Spec Grav, UA: 1.01 (ref 1.030–1.035)
Urobilinogen, UA: 1 (ref ?–2.0)

## 2016-11-12 MED ORDER — SULFAMETHOXAZOLE-TRIMETHOPRIM 800-160 MG PO TABS: 1.0000 | ORAL_TABLET | Freq: Two times a day (BID) | ORAL | 0 refills | Status: DC

## 2016-11-12 NOTE — Assessment & Plan Note (Signed)
A: pt presenting w/ dysuria that is intermittent. Her urine dipstick is + for nitrates ane LE. Previously tx for UTI in September that was found to be due to E.coli sensitive to bactrim.   P: send urine for formal UA and culture. Start on bactrim DS BID x 3 days.

## 2016-11-12 NOTE — Patient Instructions (Signed)
Start taking bactrim twice a day for 3 days.

## 2016-11-12 NOTE — Progress Notes (Signed)
Medicine attending: Medical history, presenting problems, physical findings, and medications, reviewed with resident physician Dr Diana Truong on the day of the patient visit and I concur with her evaluation and management plan. 

## 2016-11-12 NOTE — Progress Notes (Signed)
   CC: dysuria  HPI:  Ms.Leanor Judie PetitM Doreene ElandHendrix is a 71 y.o. with PMHx as outlined below who presents to clinic for dysuria. Please see problem list for further details of patient's chronic medical issues.   Past Medical History:  Diagnosis Date  . Allergic rhinitis, cause unspecified   . Anxiety   . Bundle branch block, unspecified   . Cervical spondylosis without myelopathy 08/08/2015  . Depression 08/2009   multiple stressors: including son who has suffered 2 CVA's and is in a rehab facility, financial difficulties, loss of pets, etc.  . Depression   . Dermatitis   . GERD (gastroesophageal reflux disease)    on bid omeprazole  . HERPES ZOSTER 09/11/2009   Qualifier: Diagnosis of  By: Coralee PesaWoodyear MD, Levada SchillingWynne E   . History of shingles   . Hyperlipemia   . Hypertension   . Irritable bowel syndrome   . Memory disturbance   . Obesity   . Osteoarthrosis, unspecified whether generalized or localized, unspecified site   . Parkinson disease (HCC) 06/02/12   Probable; being follwed by Dr. Anne HahnWillis of [facility], has been started on Requip  . REM sleep behavior disorder   . Sciatica of right side   . Spinal stenosis in cervical region   . Uterovaginal prolapse, incomplete    with cystocele    Review of Systems:  Denies fevers, dark stools, blood per rectum, urinary hesitancy, abd pain. She has been having intermittent dysuria x 3-4 days and has b/l stress incontinence for which she wears depends briefs for.  Physical Exam:  Vitals:   11/12/16 1344  BP: (!) 125/55  Pulse: 70  Temp: 98.5 F (36.9 C)  TempSrc: Oral  SpO2: 100%  Weight: 155 lb 12.8 oz (70.7 kg)   Physical Exam  Constitutional: appears well-developed and well-nourished. No distress.  HENT:  Head: Normocephalic and atraumatic.  Nose: Nose normal.  Cardiovascular: Normal rate, regular rhythm and normal heart sounds.  Exam reveals no gallop and no friction rub.   No murmur heard. Pulmonary/Chest: Effort normal and breath sounds  normal. No respiratory distress.  has no wheezes.no rales.  Abdominal: Soft. Bowel sounds are normal.  exhibits no distension. There is no tenderness. There is no rebound and no guarding.     Assessment & Plan:   See Encounters Tab for problem based charting.  Patient discussed with Dr. Cyndie ChimeGranfortuna

## 2016-11-12 NOTE — Assessment & Plan Note (Addendum)
Given stool cards. Referral for mammogram placed.

## 2016-11-14 LAB — URINE CULTURE

## 2016-11-19 ENCOUNTER — Ambulatory Visit (INDEPENDENT_AMBULATORY_CARE_PROVIDER_SITE_OTHER): Payer: Medicare Other | Admitting: Neurology

## 2016-11-19 ENCOUNTER — Encounter: Payer: Self-pay | Admitting: Neurology

## 2016-11-19 VITALS — BP 115/63 | HR 77 | Ht 60.0 in | Wt 154.5 lb

## 2016-11-19 DIAGNOSIS — G20 Parkinson's disease: Secondary | ICD-10-CM | POA: Diagnosis not present

## 2016-11-19 DIAGNOSIS — R413 Other amnesia: Secondary | ICD-10-CM | POA: Diagnosis not present

## 2016-11-19 DIAGNOSIS — G4762 Sleep related leg cramps: Secondary | ICD-10-CM | POA: Insufficient documentation

## 2016-11-19 HISTORY — DX: Sleep related leg cramps: G47.62

## 2016-11-19 MED ORDER — CARBIDOPA-LEVODOPA 25-100 MG PO TABS: ORAL_TABLET | ORAL | 2 refills | Status: DC

## 2016-11-19 NOTE — Progress Notes (Signed)
Reason for visit: Parkinson's disease  Christina Stevenson is an 71 y.o. female  History of present illness:  Christina Stevenson is a 71 year old right-handed white female with a history of Parkinson's disease. The patient has tremors on the left greater than right upper extremity that have worsened over time. The patient has also noted onset of nocturnal leg cramps that mainly affects the thighs occurring 2 or 3 times a week. The patient has to get up and stretch out the legs in order to get rid of the cramps. She does have some mild memory problems, she does not believe that this has changed much over time. She is on low-dose Sinemet at this time, she is tolerating the drug. She has been walking on a regular basis, she denies any falls, she denies any problems with swallowing. She has not had any confusion or hallucinations on the medication. She returns to this office for an evaluation.  Past Medical History:  Diagnosis Date  . Allergic rhinitis, cause unspecified   . Anxiety   . Bundle branch block, unspecified   . Cervical spondylosis without myelopathy 08/08/2015  . Depression 08/2009   multiple stressors: including son who has suffered 2 CVA's and is in a rehab facility, financial difficulties, loss of pets, etc.  . Depression   . Dermatitis   . GERD (gastroesophageal reflux disease)    on bid omeprazole  . HERPES ZOSTER 09/11/2009   Qualifier: Diagnosis of  By: Coralee Pesa MD, Levada Schilling   . History of shingles   . Hyperlipemia   . Hypertension   . Irritable bowel syndrome   . Memory disturbance   . Nocturnal leg cramps 11/19/2016  . Obesity   . Osteoarthrosis, unspecified whether generalized or localized, unspecified site   . Parkinson disease (HCC) 06/02/12   Probable; being follwed by Dr. Anne Hahn of [facility], has been started on Requip  . REM sleep behavior disorder   . Sciatica of right side   . Spinal stenosis in cervical region   . Uterovaginal prolapse, incomplete    with cystocele     Past Surgical History:  Procedure Laterality Date  . ABDOMINAL HYSTERECTOMY    . bladder resuspension procedure    . CERVICAL LAMINECTOMY    . LUMBAR LAMINECTOMY    . Trigger finger surgery Right    Thumb    Family History  Problem Relation Age of Onset  . Heart Problems Father     lived to be 20 yo  . Cancer - Lung Sister     Social history:  reports that she has never smoked. She has never used smokeless tobacco. She reports that she does not drink alcohol or use drugs.    Allergies  Allergen Reactions  . Prednisone     Swelling, emotionally volatile   . Latex Rash    Medications:  Prior to Admission medications   Medication Sig Start Date End Date Taking? Authorizing Provider  ALPRAZolam Prudy Feeler) 1 MG tablet TAKE 1 TABLET BY MOUTH AT BEDTIME 10/17/16  Yes York Spaniel, MD  atorvastatin (LIPITOR) 20 MG tablet Take 1 tablet (20 mg total) by mouth daily. 12/12/15 12/11/16 Yes Denton Brick, MD  carbidopa-levodopa (SINEMET) 25-100 MG tablet One tablet in the morning, 1/2 at midday and 1/2 in the evening 11/19/16  Yes York Spaniel, MD  clobetasol ointment (TEMOVATE) 0.05 % apply to affected area twice a day 01/17/16  Yes Denton Brick, MD  cyclobenzaprine (FLEXERIL) 10 MG tablet  Take 1 tablet (10 mg total) by mouth 3 (three) times daily as needed for muscle spasms. 10/12/14  Yes York Spaniel, MD  diphenhydrAMINE (BENADRYL) 25 mg capsule Take 25 mg by mouth every 6 (six) hours as needed.   Yes Historical Provider, MD  ibuprofen (ADVIL,MOTRIN) 200 MG tablet Take 200 mg by mouth every 6 (six) hours as needed.   Yes Historical Provider, MD  pantoprazole (PROTONIX) 20 MG tablet TAKE 1 TABLET BY MOUTH  TWICE DAILY BEFORE  BREAKFAST AND DINNER. 12/05/15  Yes Denton Brick, MD  quinapril (ACCUPRIL) 20 MG tablet Take 1 tablet by mouth  daily 01/17/16  Yes Denton Brick, MD  rOPINIRole (REQUIP) 3 MG tablet Take 1 tablet by mouth 3  times daily 05/06/16  Yes York Spaniel, MD     ROS:  Out of a complete 14 system review of symptoms, the patient complains only of the following symptoms, and all other reviewed systems are negative.  Tremor Leg cramps  Blood pressure 115/63, pulse 77, height 5' (1.524 m), weight 154 lb 8 oz (70.1 kg).  Physical Exam  General: The patient is alert and cooperative at the time of the examination.  Skin: No significant peripheral edema is noted.   Neurologic Exam  Mental status: The patient is alert and oriented x 3 at the time of the examination. The patient has apparent normal recent and remote memory, with an apparently normal attention span and concentration ability. The Mini-Mental Status Examination done today shows a total score 29/30.   Cranial nerves: Facial symmetry is present. Speech is normal, no aphasia or dysarthria is noted. Extraocular movements are full. Visual fields are full. Masking of the face is seen.  Motor: The patient has good strength in all 4 extremities.  Sensory examination: Soft touch sensation is symmetric on the face, arms, and legs.  Coordination: The patient has good finger-nose-finger and heel-to-shin bilaterally. The patient has resting tremors of the left greater right upper extremity  Gait and station: The patient is able to arise from a seated position with arms crossed. Once up, she ambulates independently, decreased arm swing seen bilaterally, left greater than right upper extremity tremor. The patient has good turns. Tandem gait is minimally unsteady. Romberg is negative. No drift is seen.  Reflexes: Deep tendon reflexes are symmetric.   Assessment/Plan:  1. Parkinson's disease  2. Mild memory disturbance  3. Nocturnal leg cramps  The patient will go up on the Sinemet slightly taking one full tablet of the 25/100 mg tablet in the morning, one half tablet at midday and one half tablet in the evening. The patient will take magnesium supplementation in the evening hours, if this  is not effective, she will call our office, we may call in low dose baclofen at night for the leg cramps. She will follow-up in 5 months. The memory problems will be followed over time.  Marlan Palau MD 11/19/2016 10:23 AM  [facility] [address] [place], [place] [address]  Phone [phone] Fax [phone]

## 2016-11-19 NOTE — Patient Instructions (Signed)
We will go up on the Sinemet 25/100 taking one tablet in the morning and 1/2 tablet at midday and evening.  Take magnesium tablets 250 mg at night for the nocturnal leg cramps,watch out for diarrhea. If this is not effective, please call our office.

## 2016-11-26 ENCOUNTER — Other Ambulatory Visit (INDEPENDENT_AMBULATORY_CARE_PROVIDER_SITE_OTHER): Payer: Medicare Other

## 2016-11-26 DIAGNOSIS — Z0000 Encounter for general adult medical examination without abnormal findings: Secondary | ICD-10-CM

## 2016-11-26 DIAGNOSIS — Z1211 Encounter for screening for malignant neoplasm of colon: Secondary | ICD-10-CM | POA: Diagnosis not present

## 2016-11-26 LAB — POC HEMOCCULT BLD/STL (HOME/3-CARD/SCREEN)
Card #2 Fecal Occult Blod, POC: NEGATIVE
Card #3 Fecal Occult Blood, POC: NEGATIVE
Fecal Occult Blood, POC: NEGATIVE

## 2016-12-02 ENCOUNTER — Other Ambulatory Visit: Payer: Self-pay | Admitting: Internal Medicine

## 2016-12-02 DIAGNOSIS — E785 Hyperlipidemia, unspecified: Secondary | ICD-10-CM

## 2016-12-05 ENCOUNTER — Other Ambulatory Visit: Payer: Self-pay | Admitting: Internal Medicine

## 2016-12-22 ENCOUNTER — Ambulatory Visit
Admission: RE | Admit: 2016-12-22 | Discharge: 2016-12-22 | Disposition: A | Discharge status: Home / Self Care | Payer: Medicare Other | Source: Ambulatory Visit | Attending: Student in an Organized Health Care Education/Training Program | Admitting: Student in an Organized Health Care Education/Training Program

## 2016-12-22 DIAGNOSIS — Z1231 Encounter for screening mammogram for malignant neoplasm of breast: Secondary | ICD-10-CM | POA: Diagnosis not present

## 2016-12-22 DIAGNOSIS — Z0000 Encounter for general adult medical examination without abnormal findings: Secondary | ICD-10-CM

## 2017-01-14 ENCOUNTER — Other Ambulatory Visit: Payer: Self-pay

## 2017-01-14 MED ORDER — CLOBETASOL PROPIONATE 0.05 % EX OINT: TOPICAL_OINTMENT | CUTANEOUS | 0 refills | Status: DC

## 2017-01-14 NOTE — Telephone Encounter (Signed)
clobetasol ointment (TEMOVATE) 0.05 %, refill request @ [facility] on [address]

## 2017-01-20 ENCOUNTER — Other Ambulatory Visit: Payer: Self-pay | Admitting: Neurology

## 2017-01-20 NOTE — Telephone Encounter (Signed)
Faxed printed/signed rx xanax to pt pharmacy. Fax: [phone]. Received confirmation.

## 2017-01-27 ENCOUNTER — Encounter: Payer: Self-pay | Admitting: *Deleted

## 2017-03-05 ENCOUNTER — Other Ambulatory Visit: Payer: Self-pay | Admitting: *Deleted

## 2017-03-05 MED ORDER — QUINAPRIL HCL 20 MG PO TABS: 20.0000 mg | ORAL_TABLET | Freq: Every day | ORAL | 3 refills | Status: DC

## 2017-04-09 ENCOUNTER — Other Ambulatory Visit: Payer: Self-pay | Admitting: Neurology

## 2017-05-06 ENCOUNTER — Ambulatory Visit (INDEPENDENT_AMBULATORY_CARE_PROVIDER_SITE_OTHER): Payer: Medicare Other | Admitting: Neurology

## 2017-05-06 ENCOUNTER — Encounter: Payer: Self-pay | Admitting: Neurology

## 2017-05-06 VITALS — BP 135/70 | HR 89 | Ht 60.0 in | Wt 159.0 lb

## 2017-05-06 DIAGNOSIS — R413 Other amnesia: Secondary | ICD-10-CM | POA: Diagnosis not present

## 2017-05-06 DIAGNOSIS — G20 Parkinson's disease: Secondary | ICD-10-CM

## 2017-05-06 MED ORDER — CARBIDOPA-LEVODOPA 25-100 MG PO TABS: 1.0000 | ORAL_TABLET | Freq: Three times a day (TID) | ORAL | 2 refills | Status: DC

## 2017-05-06 MED ORDER — LORAZEPAM 1 MG PO TABS: 1.0000 mg | ORAL_TABLET | Freq: Three times a day (TID) | ORAL | 3 refills | Status: DC

## 2017-05-06 NOTE — Patient Instructions (Signed)
   Stop alprazolam 1 mg tablet. Start lorazepam 1 mg at night.  We will go up on the Sinemet 25/100 mg tablet taking one full tablet three times a day.  Sinemet (carbidopa) may result in confusion or hallucinations, drowsiness, nausea, or dizziness. If any significant side effects are noted, please contact our office. Sinemet may not be well absorbed when taken with high protein meals, if tolerated it is best to take 30-45 minutes before you eat.

## 2017-05-06 NOTE — Progress Notes (Signed)
Reason for visit: Parkinson's disease  Christina Stevenson is an 71 y.o. female  History of present illness:  Christina Stevenson is a 71 year old right-handed white female with a history of Parkinson's disease. The patient has done relatively well with her gait stability, she has not had any falls. She tries to walk on a regular basis. The patient is having some problems with insomnia at night, she takes alprazolam 1 mg at night for sleep, but she will wake up around 3 in the morning and cannot get back to sleep. She will have vivid dreams later on in the evening. She will act out her dreams at times. She reports some occasional choking with swallowing, this is not a big issue for her. She does report some fatigue during the day. She does have some memory troubles but she believes this has been stable since last seen. She does operate a motor vehicle without difficulty. The patient feels that she is somewhat distractible when he comes to completing tasks. She returns to this office for an evaluation.  Past Medical History:  Diagnosis Date  . Allergic rhinitis, cause unspecified   . Anxiety   . Bundle branch block, unspecified   . Cervical spondylosis without myelopathy 08/08/2015  . Depression 08/2009   multiple stressors: including son who has suffered 2 CVA's and is in a rehab facility, financial difficulties, loss of pets, etc.  . Depression   . Dermatitis   . GERD (gastroesophageal reflux disease)    on bid omeprazole  . HERPES ZOSTER 09/11/2009   Qualifier: Diagnosis of  By: Coralee Pesa MD, Levada Schilling   . History of shingles   . Hyperlipemia   . Hypertension   . Irritable bowel syndrome   . Memory disturbance   . Nocturnal leg cramps 11/19/2016  . Obesity   . Osteoarthrosis, unspecified whether generalized or localized, unspecified site   . Parkinson disease (HCC) 06/02/12   Probable; being follwed by Dr. Anne Hahn of [facility], has been started on Requip  . REM sleep behavior disorder   . Sciatica of  right side   . Spinal stenosis in cervical region   . Uterovaginal prolapse, incomplete    with cystocele    Past Surgical History:  Procedure Laterality Date  . ABDOMINAL HYSTERECTOMY    . bladder resuspension procedure    . CERVICAL LAMINECTOMY    . LUMBAR LAMINECTOMY    . Trigger finger surgery Right    Thumb    Family History  Problem Relation Age of Onset  . Heart Problems Father        lived to be 56 yo  . Cancer - Lung Sister     Social history:  reports that she has never smoked. She has never used smokeless tobacco. She reports that she does not drink alcohol or use drugs.    Allergies  Allergen Reactions  . Prednisone     Swelling, emotionally volatile   . Latex Rash    Medications:  Prior to Admission medications   Medication Sig Start Date End Date Taking? Authorizing Provider  ALPRAZolam Prudy Feeler) 1 MG tablet TAKE 1 TABLET BY MOUTH AT BEDTIME 01/20/17  Yes York Spaniel, MD  atorvastatin (LIPITOR) 20 MG tablet TAKE 1 TABLET BY MOUTH  DAILY 12/03/16  Yes Denton Brick, MD  carbidopa-levodopa (SINEMET) 25-100 MG tablet One tablet in the morning, 1/2 at midday and 1/2 in the evening 11/19/16  Yes York Spaniel, MD  clobetasol ointment (TEMOVATE) 0.05 %  apply to affected area twice a day 01/14/17  Yes Denton Brick, MD  Cyanocobalamin (VITAMIN B-12 PO) Take 1 Dose by mouth. Pt does not take every day. Per patient, she takes "sometimes"   Yes [provider]  cyclobenzaprine (FLEXERIL) 10 MG tablet Take 1 tablet (10 mg total) by mouth 3 (three) times daily as needed for muscle spasms. 10/12/14  Yes York Spaniel, MD  diphenhydrAMINE (BENADRYL) 25 mg capsule Take 25 mg by mouth every 6 (six) hours as needed.   Yes [provider]  ibuprofen (ADVIL,MOTRIN) 200 MG tablet Take 200 mg by mouth every 6 (six) hours as needed.   Yes [provider]  MAGNESIUM PO Take 1 Dose by mouth daily.   Yes [provider]  pantoprazole  (PROTONIX) 20 MG tablet TAKE 1 TABLET BY MOUTH  TWICE DAILY BEFORE  BREAKFAST AND DINNER 12/05/16  Yes Denton Brick, MD  POTASSIUM PO Take 1 Dose by mouth daily.   Yes [provider]  quinapril (ACCUPRIL) 20 MG tablet Take 1 tablet (20 mg total) by mouth daily. 03/05/17  Yes Levert Feinstein, MD  rOPINIRole (REQUIP) 3 MG tablet TAKE 1 TABLET BY MOUTH 3  TIMES DAILY 04/09/17  Yes York Spaniel, MD    ROS:  Out of a complete 14 system review of symptoms, the patient complains only of the following symptoms, and all other reviewed systems are negative.  Fatigue Eye discharge, eye itching  Blood pressure 135/70, pulse 89, height 5' (1.524 m), weight 159 lb (72.1 kg), SpO2 95 %.  Physical Exam  General: The patient is alert and cooperative at the time of the examination. The patient is moderately obese.  Skin: No significant peripheral edema is noted.   Neurologic Exam  Mental status: The patient is alert and oriented x 3 at the time of the examination. The patient has apparent normal recent and remote memory, with an apparently normal attention span and concentration ability. Mini-Mental Status Examination done today reveals a total score 29/30.   Cranial nerves: Facial symmetry is present. Speech is normal, no aphasia or dysarthria is noted. Extraocular movements are full. Visual fields are full. Mild masking of the face is seen.  Motor: The patient has good strength in all 4 extremities.  Sensory examination: Soft touch sensation is symmetric on the face, arms, and legs.  Coordination: The patient has good finger-nose-finger and heel-to-shin bilaterally.  Gait and station: The patient has the ability to stand from a seated position with the arms crossed. Once up, the patient can walk independently, she has decreased arm swing on the left, tremor seen with walking with the left arm. Romberg is negative. No drift is seen.  Reflexes: Deep tendon reflexes are  symmetric.   Assessment/Plan:  1. Parkinson's disease  2. Mild memory disturbance  3. REM sleep disorder  The patient waking up at night and cannot get back to sleep, alprazolam likely is wearing off and she is having REM rebound and she is acting out her dreams. The patient will be switched from alprazolam to 1 mg of Ativan at night. The patient will be increased on the Sinemet taking the 25/100 mg tablets, one full tablet 3 times daily. She will follow-up in 5 months. She will contact our office for any dose adjustments of her medications. She will remain on the Requip at the current dose.   Marlan Palau MD 05/06/2017 11:01 AM  [facility] [address]  [place], [place] [address]  Phone [phone] Fax [phone]

## 2017-06-27 ENCOUNTER — Encounter: Payer: Self-pay | Admitting: Neurology

## 2017-06-29 ENCOUNTER — Other Ambulatory Visit: Payer: Self-pay | Admitting: Neurology

## 2017-06-29 MED ORDER — CLONAZEPAM 1 MG PO TABS: 1.0000 mg | ORAL_TABLET | Freq: Every day | ORAL | 2 refills | Status: DC

## 2017-06-30 NOTE — Telephone Encounter (Signed)
[facility] printed[facility] [address] at [phone]. Received fax confirmation.

## 2017-07-01 ENCOUNTER — Other Ambulatory Visit: Payer: Self-pay

## 2017-07-01 ENCOUNTER — Encounter: Payer: Self-pay | Admitting: Internal Medicine

## 2017-07-01 ENCOUNTER — Ambulatory Visit (INDEPENDENT_AMBULATORY_CARE_PROVIDER_SITE_OTHER): Payer: Medicare Other | Admitting: Internal Medicine

## 2017-07-01 DIAGNOSIS — Z79899 Other long term (current) drug therapy: Secondary | ICD-10-CM

## 2017-07-01 DIAGNOSIS — K5900 Constipation, unspecified: Secondary | ICD-10-CM | POA: Diagnosis not present

## 2017-07-01 DIAGNOSIS — K219 Gastro-esophageal reflux disease without esophagitis: Secondary | ICD-10-CM

## 2017-07-01 DIAGNOSIS — R110 Nausea: Secondary | ICD-10-CM

## 2017-07-01 DIAGNOSIS — N951 Menopausal and female climacteric states: Secondary | ICD-10-CM

## 2017-07-01 DIAGNOSIS — Z23 Encounter for immunization: Secondary | ICD-10-CM | POA: Diagnosis not present

## 2017-07-01 DIAGNOSIS — I10 Essential (primary) hypertension: Secondary | ICD-10-CM

## 2017-07-01 DIAGNOSIS — G20 Parkinson's disease: Secondary | ICD-10-CM | POA: Diagnosis not present

## 2017-07-01 DIAGNOSIS — R232 Flushing: Secondary | ICD-10-CM

## 2017-07-01 MED ORDER — POLYETHYLENE GLYCOL 3350 17 G PO PACK: 17.0000 g | PACK | Freq: Every day | ORAL | 1 refills | Status: DC

## 2017-07-01 NOTE — Progress Notes (Signed)
   CC: hypertension, GERD, Parkinson's Disease  HPI:  [name] is a 71 y.o. with past medical history as documented below presenting for follow up of hypertension, GERD, Parkinson Disease, and for flu vaccination. Please see encounter based charting for a detailed description of the patient's chronic medical problems.   Past Medical History:  Diagnosis Date  . Allergic rhinitis, cause unspecified   . Anxiety   . Bundle branch block, unspecified   . Cervical spondylosis without myelopathy 08/08/2015  . Depression 08/2009   multiple stressors: including son who has suffered 2 CVA's and is in a rehab facility, financial difficulties, loss of pets, etc.  . Depression   . Dermatitis   . GERD (gastroesophageal reflux disease)    on bid omeprazole  . HERPES ZOSTER 09/11/2009   Qualifier: Diagnosis of  By: Coralee PesaWoodyear MD, Levada SchillingWynne E   . History of shingles   . Hyperlipemia   . Hypertension   . Irritable bowel syndrome   . Memory disturbance   . Nocturnal leg cramps 11/19/2016  . Obesity   . Osteoarthrosis, unspecified whether generalized or localized, unspecified site   . Parkinson disease (HCC) 06/02/12   Probable; being follwed by Dr. Anne HahnWillis of [facility], has been started on Requip  . REM sleep behavior disorder   . Sciatica of right side   . Spinal stenosis in cervical region   . Uterovaginal prolapse, incomplete    with cystocele   Review of Systems:   Review of Systems  Constitutional: Positive for malaise/fatigue. Negative for chills, fever and weight loss.  Gastrointestinal: Positive for constipation and nausea. Negative for abdominal pain, diarrhea and heartburn.  Genitourinary: Negative for dysuria, frequency and urgency.  Musculoskeletal: Positive for joint pain.  Neurological: Positive for tremors and headaches.  Psychiatric/Behavioral: Positive for memory loss. Negative for depression.    Physical Exam:  Vitals:   07/01/17 1434  BP: (!) 140/56  Pulse: 82  Temp:  98.1 F (36.7 C)  SpO2: 100%  Weight: 156 lb 11.2 oz (71.1 kg)   Physical Exam  Constitutional: She is oriented to person, place, and time. She appears well-developed and well-nourished. No distress.  HENT:  Head: Normocephalic and atraumatic.  Eyes: Conjunctivae and EOM are normal. Pupils are equal, round, and reactive to light.  Neck: Normal range of motion. Neck supple. No thyromegaly present.  Cardiovascular: Normal rate, regular rhythm and normal heart sounds.  Pulmonary/Chest: Effort normal and breath sounds normal.  Abdominal: Soft. Bowel sounds are normal. There is no tenderness.  Musculoskeletal: Normal range of motion. She exhibits edema.  Lymphadenopathy:    She has no cervical adenopathy.  Neurological: She is alert and oriented to person, place, and time. No cranial nerve deficit.  Mildly slowed speech. Resting tremor of forearms bilaterally. Strength 5/5 in upper and lower extremities. Normal gait.   Skin: Skin is warm and dry.  Psychiatric: She has a normal mood and affect.    Assessment & Plan:   See Encounters Tab for problem based charting.  Patient seen with Dr. Sandre Kittyaines

## 2017-07-01 NOTE — Assessment & Plan Note (Signed)
Patient states she is frequently constipated and will have to take Doculax as needed for constipation. Discussed with patient that if she takes Miralax daily this can prevent constipation from occurring and keep her bowel movements regular. She was agreeable to try.   Plan:  -Prescribed Miralax 17 gram packets daily  -discussed with patient that she can adjust the dosing to what works best with her

## 2017-07-01 NOTE — Patient Instructions (Addendum)
Ms. Christina Stevenson,   It was a pleasure meeting you today!  Continue to take all of your medications as prescribed. I made no changes to your medications.   I have prescribed you Miralax for constipation. Take 1 packet daily to prevent constipation. If this makes your stools to loose take half a packet daily or adjust until you stools are regular.   I would like to see back in 6 months. Please call sooner if you anything comes up.

## 2017-07-01 NOTE — Assessment & Plan Note (Addendum)
Stable, continues to follow with Dr. Anne HahnWillis and has been seeing him every 5 months or so. She is currently on Ropinerole and Carbidopa-levodopa TID. She denies any side effects with these medications and is tolerating them well. The patient states she has tremors in both forearms. Her gait is stable and she has not had any recent falls. She denies difficulty swallowing. She does endorse short term memory loss. She says that she can easily remember things from 40 years ago but will likely forget my name when she gets in the car to leave clinic. She also states that she is unable to cook without supervision. She states she forgets certain ingredients and needs her husbands help. She also has been having difficulty sleeping and was recently switched from ativan to clonopin for REM sleep disturbances. She states the clonopin has not helped. She believes her difficulty sleeping contributes to fatigue that she feels during the day. She denied symptoms of depression, but became very tearful and started discussing her son. Her son is 70 years old and has suffered two strokes and resides in a skilled nursing facility. He is unable to talk and has right sided paralysis, she states this is very difficult to deal with at times. Her and her husband are able to visit him ~ 4 times weekly. Per chart review the patient will typically get tearful during appointments when discussion her son.   Plan: -Continue current treatment regimen of Sinemet and Requip

## 2017-07-01 NOTE — Assessment & Plan Note (Signed)
Patient states her GERD is well controlled on Protonix 20 mg. She will occasionally have break through reflux which will subside with tums. The associations and risks of being on long term Protonix were discussed with the patient: including osteoporosis and increased risk of fracture, pneumonia, and c.diff. At this time she does not want to stop taking the medication.   Plan: -Continue Protonix 20 mg daily -Reassess willingness to do a cessation trial at next visit

## 2017-07-01 NOTE — Assessment & Plan Note (Signed)
Patient no longer on Estrace. Her hot flash symptoms have improved. She states that now her ears will only get hot and feel warm, which is much improved from previous symptoms of hot flashes.

## 2017-07-01 NOTE — Assessment & Plan Note (Addendum)
BP Readings from Last 3 Encounters:  07/01/17 134/60  05/06/17 135/70  11/19/16 115/63   Blood pressure improved on recheck from 140 systolic to 134 systolic. Patient's current regimen includes Quinapril 20 mg daily. I think <140/80 is a reasonable gaol for this patient given autonomic dysfunction and orthostasis that can occur with parkinson's disease. Will hold off on repeat labs as she had a BMET 1 year ago that was within normal limits.  Plan: -continue Quinapril 20 mg daily -BMET to assess creatinine and K+ in 6 months to one year

## 2017-07-03 NOTE — Progress Notes (Signed)
Internal Medicine Clinic Attending  I saw and evaluated the patient.  I personally confirmed the key portions of the history and exam documented by Dr. Minda MeoLaCroce and I reviewed pertinent patient test results.  The assessment, diagnosis, and plan were formulated together and I agree with the documentation in the resident's note.  Discussed PPI is not a good long-term med due to risks of PNA, C diff, and osteoporosis. She would like to continue for now, will continue to reassess. Otherwise doing well.   Anne ShutterAlexander N Raines, MD

## 2017-07-15 ENCOUNTER — Other Ambulatory Visit: Payer: Self-pay | Admitting: Neurology

## 2017-07-15 NOTE — Telephone Encounter (Signed)
I called the patient left a message.  The patient is requesting a prescription for Flexeril that she has not gotten since 2016.  I will give her a small prescription, but if she needs to continue on the medication, I will need to know why she is to continue the drug.

## 2017-07-24 ENCOUNTER — Encounter: Payer: Self-pay | Admitting: Neurology

## 2017-09-21 ENCOUNTER — Encounter: Payer: Self-pay | Admitting: Neurology

## 2017-10-05 ENCOUNTER — Encounter: Payer: Self-pay | Admitting: Neurology

## 2017-10-05 ENCOUNTER — Ambulatory Visit: Payer: Medicare Other | Admitting: Neurology

## 2017-10-05 VITALS — BP 129/73 | HR 84 | Ht 60.0 in | Wt 159.5 lb

## 2017-10-05 DIAGNOSIS — G4752 REM sleep behavior disorder: Secondary | ICD-10-CM

## 2017-10-05 DIAGNOSIS — R413 Other amnesia: Secondary | ICD-10-CM | POA: Diagnosis not present

## 2017-10-05 DIAGNOSIS — G20 Parkinson's disease: Secondary | ICD-10-CM

## 2017-10-05 MED ORDER — CLONAZEPAM 0.5 MG PO TABS: 1.5000 mg | ORAL_TABLET | Freq: Every day | ORAL | 1 refills | Status: DC

## 2017-10-05 NOTE — Patient Instructions (Signed)
   We will go up on the clonazepam 0.5 mg taking 3 (1.5 mg) at night.  If the problems with sleeping continue, please call our office.

## 2017-10-05 NOTE — Progress Notes (Signed)
Reason for visit: Parkinson's disease  Christina MccallumBonnie Stevenson Stevenson is an 72 y.o. female  History of present illness:  Ms. Christina Stevenson is a [age] year old right-handed white female with a history of Parkinson's disease.  The patient has had ongoing tremors affecting the left greater than right upper extremity.  The patient also reports some mild memory issues that have remained stable since last seen.  She is having hallucinations on a fairly regular basis, she will see a cat running through the house frequently, occasionally she will see an [place] that comes in to check on her.  The patient indicates that the hallucinations are not threatening to her.  She has been increased on Sinemet taking 25/100 mg tablets 3 times daily.  Otherwise she tolerates the medication well, she remains on Requip 3 mg 3 times daily.  She still has some troubles with sleeping at night, she will wake up around 3 in the morning and has difficulty getting back to sleep, she will have vivid dreams at this time.  She is on clonazepam for a REM sleep disorder taking 1 mg at night.  The patient does snore at night.  The patient returns to this office for an evaluation.  The patient reports no falls, she does have some mild gait instability, she does not use a cane for ambulation.  Past Medical History:  Diagnosis Date  . Allergic rhinitis, cause unspecified   . Anxiety   . Bundle branch block, unspecified   . Cervical spondylosis without myelopathy 08/08/2015  . Depression 08/2009   multiple stressors: including son who has suffered 2 CVA's and is in a rehab facility, financial difficulties, loss of pets, etc.  . Depression   . Dermatitis   . GERD (gastroesophageal reflux disease)    on bid omeprazole  . HERPES ZOSTER 09/11/2009   Qualifier: Diagnosis of  By: Christina PesaWoodyear MD, Christina Stevenson   . History of shingles   . Hyperlipemia   . Hypertension   . Irritable bowel syndrome   . Memory disturbance   . Nocturnal leg cramps 11/19/2016  . Obesity    . Osteoarthrosis, unspecified whether generalized or localized, unspecified site   . Parkinson disease (HCC) 06/02/12   Probable; being follwed by Dr. Anne HahnWillis of [facility], has been started on Requip  . REM sleep behavior disorder   . Sciatica of right side   . Spinal stenosis in cervical region   . Uterovaginal prolapse, incomplete    with cystocele    Past Surgical History:  Procedure Laterality Date  . ABDOMINAL HYSTERECTOMY    . bladder resuspension procedure    . CERVICAL LAMINECTOMY    . LUMBAR LAMINECTOMY    . Trigger finger surgery Right    Thumb    Family History  Problem Relation Age of Onset  . Heart Problems Father        lived to be 71 yo  . Cancer - Lung Sister     Social history:  reports that  has never smoked. she has never used smokeless tobacco. She reports that she does not drink alcohol or use drugs.    Allergies  Allergen Reactions  . Prednisone     Swelling, emotionally volatile   . Latex Rash    Medications:  Prior to Admission medications   Medication Sig Start Date End Date Taking? Authorizing Provider  atorvastatin (LIPITOR) 20 MG tablet TAKE 1 TABLET BY MOUTH  DAILY 12/03/16  Yes Christina Stevenson, Christina M, MD  carbidopa-levodopa (SINEMET) 25-100  MG tablet Take 1 tablet by mouth 3 (three) times daily. 05/06/17  Yes Christina Spaniel, MD  clobetasol ointment (TEMOVATE) 0.05 % apply to affected area twice a day 01/14/17  Yes Christina Brick, MD  Cyanocobalamin (VITAMIN B-12 PO) Take 1 Dose by mouth. Pt does not take every day. Per patient, she takes "sometimes"   Yes [provider]  cyclobenzaprine (FLEXERIL) 10 MG tablet TAKE 1 TABLET BY MOUTH THREE TIMES DAILY AS NEEDED 07/15/17  Yes Christina Spaniel, MD  diphenhydrAMINE (BENADRYL) 25 mg capsule Take 25 mg by mouth every 6 (six) hours as needed.   Yes [provider]  ibuprofen (ADVIL,MOTRIN) 200 MG tablet Take 200 mg by mouth every 6 (six) hours as needed.   Yes [provider]    pantoprazole (PROTONIX) 20 MG tablet TAKE 1 TABLET BY MOUTH  TWICE DAILY BEFORE  BREAKFAST AND DINNER 12/05/16  Yes Christina Brick, MD  quinapril (ACCUPRIL) 20 MG tablet Take 1 tablet (20 mg total) by mouth daily. 03/05/17  Yes Christina Feinstein, MD  rOPINIRole (REQUIP) 3 MG tablet TAKE 1 TABLET BY MOUTH 3  TIMES DAILY 04/09/17  Yes Christina Spaniel, MD  clonazePAM (KLONOPIN) 0.5 MG tablet Take 3 tablets (1.5 mg total) by mouth at bedtime. 10/05/17   Christina Spaniel, MD    ROS:  Out of a complete 14 system review of symptoms, the patient complains only of the following symptoms, and all other reviewed systems are negative.  Runny nose, difficulty swallowing Eye discharge, blurred vision Leg swelling, heart murmur Cold intolerance Constipation Frequent infections Frequency of urination, urinary urgency Back pain, aching muscles, muscle cramps, walking difficulty, neck pain, neck stiffness Skin rash, itching  Blood pressure 129/73, pulse 84, height 5' (1.524 Stevenson), weight 159 lb 8 oz (72.3 kg).  Physical Exam  General: The patient is alert and cooperative at the time of the examination.  Skin: No significant peripheral edema is noted.   Neurologic Exam  Mental status: The patient is alert and oriented x 3 at the time of the examination. The patient has apparent normal recent and remote memory, with an apparently normal attention span and concentration ability.  Mini-Mental status examination done today shows a total score 29/30.   Cranial nerves: Facial symmetry is present. Speech is normal, no aphasia or dysarthria is noted. Extraocular movements are full. Visual fields are full.  Mild masking of the face is seen.  Motor: The patient has good strength in all 4 extremities.  Sensory examination: Soft touch sensation is symmetric on the face, arms, and legs.  Coordination: The patient has good finger-nose-finger and heel-to-shin bilaterally.  The patient has resting tremors on  the left greater than right upper extremity.  The patient has a slight tendency to lean to the left with sitting.  Gait and station: The patient is able to arise from seated position with arms crossed.  Once up, she can walk independently.  Slightly stooped posture is noted.  Tandem gait is unsteady.  Romberg is negative.  Reflexes: Deep tendon reflexes are symmetric.   Assessment/Plan:  1.  Parkinson's disease  2.  Mild memory disturbance  3.  Hallucinations  4.  REM sleep disorder  The patient will be increased on the clonazepam to 1.5 mg at night.  If she continues to have difficulty with sleeping at night she is to contact our office and we will consider a sleep evaluation.  The patient was given a prescription for  the clonazepam.  She will follow-up in 5 months.  She will continue the current dose of Sinemet and Requip.  She takes Flexeril on an as-needed basis.  So far, the hallucinations are not threatening, we will not treat at this time.  Marlan Palau MD 10/05/2017 9:52 AM  [facility] [address] [place], [place] [address]  Phone [phone] Fax [phone]

## 2017-10-05 NOTE — Progress Notes (Signed)
Faxed printed/signed rx clonazepam to [facility] at [phone]. Received fax confirmation.

## 2017-11-23 ENCOUNTER — Other Ambulatory Visit: Payer: Self-pay | Admitting: Neurology

## 2017-11-24 ENCOUNTER — Encounter: Payer: Self-pay | Admitting: *Deleted

## 2017-11-24 ENCOUNTER — Other Ambulatory Visit: Payer: Self-pay | Admitting: *Deleted

## 2017-11-24 DIAGNOSIS — L300 Nummular dermatitis: Secondary | ICD-10-CM

## 2017-11-24 DIAGNOSIS — E785 Hyperlipidemia, unspecified: Secondary | ICD-10-CM

## 2017-11-24 HISTORY — DX: Nummular dermatitis: L30.0

## 2017-11-24 MED ORDER — CLOBETASOL PROPIONATE 0.05 % EX OINT: TOPICAL_OINTMENT | CUTANEOUS | 0 refills | Status: DC

## 2017-11-24 MED ORDER — ATORVASTATIN CALCIUM 20 MG PO TABS: 20.0000 mg | ORAL_TABLET | Freq: Every day | ORAL | 3 refills | Status: DC

## 2017-11-24 NOTE — Telephone Encounter (Signed)
Pt was seen by [facility] on 08/04/2013 and was diagnosed with Nummular dermatitis and rx'd clobetasol 0.05% oint to use BID prn with otc moisturizers (see ov under media tab).  Per pt's husband Jeanelle Malling(Geroge Whatley)-pt gets these small read areas on her arms and legs from time to time throughout the year. The clobetasol ointment helps with the itching and red areas .  He also states pt has been getting this rx from her pcp for many years and that one tube usually lasts for over a year.  Pt has an appt with her pcp on 05/15 and does have any ointment at this time. Please advise.Kingsley SpittleGoldston, Darlene Cassady4/9/201911:39 AM

## 2017-11-24 NOTE — Telephone Encounter (Signed)
Thank you for this information.  Very helpful!  I have added this to her problem list so future refills will be facilitated.  I re-wrote this medication and sent it to the attached pharmacy [facility].

## 2017-11-24 NOTE — Telephone Encounter (Signed)
Prior patient of Dr. Roxan Hockeyruong's.  Ointment prescribed on Jan 14, 2017 and I can not find documentation in the chart as to indication.  Please call the patient and inquire as to the indication for the clobetasol ointment.  I did not reorder it today since I do not know the indication.  I did renew the atorvastatin.

## 2017-11-25 ENCOUNTER — Other Ambulatory Visit: Payer: Self-pay | Admitting: Internal Medicine

## 2017-11-25 ENCOUNTER — Other Ambulatory Visit: Payer: Self-pay | Admitting: *Deleted

## 2017-11-25 DIAGNOSIS — K219 Gastro-esophageal reflux disease without esophagitis: Secondary | ICD-10-CM

## 2017-11-25 MED ORDER — PANTOPRAZOLE SODIUM 20 MG PO TBEC
20.0000 mg | DELAYED_RELEASE_TABLET | Freq: Two times a day (BID) | ORAL | 3 refills | Status: DC
Start: 2017-11-25 — End: 2018-12-22

## 2017-11-26 ENCOUNTER — Other Ambulatory Visit: Payer: Self-pay | Admitting: *Deleted

## 2017-11-26 DIAGNOSIS — E785 Hyperlipidemia, unspecified: Secondary | ICD-10-CM

## 2017-11-26 MED ORDER — ATORVASTATIN CALCIUM 20 MG PO TABS
20.0000 mg | ORAL_TABLET | Freq: Every day | ORAL | 3 refills | Status: DC
Start: 2017-11-26 — End: 2018-12-14

## 2017-11-26 NOTE — Telephone Encounter (Signed)
Called pt - stated all medications go to [facility] except the cream (which goes to [facility])  b/c it's cheaper. Please send new rx to [facility]. Thanks

## 2017-11-30 ENCOUNTER — Encounter: Payer: Self-pay | Admitting: Internal Medicine

## 2017-12-30 ENCOUNTER — Other Ambulatory Visit: Payer: Self-pay

## 2017-12-30 ENCOUNTER — Encounter: Payer: Self-pay | Admitting: Internal Medicine

## 2017-12-30 ENCOUNTER — Ambulatory Visit (INDEPENDENT_AMBULATORY_CARE_PROVIDER_SITE_OTHER): Payer: Medicare Other | Admitting: Internal Medicine

## 2017-12-30 VITALS — BP 124/45 | HR 62 | Temp 97.8°F | Ht 60.0 in | Wt 159.4 lb

## 2017-12-30 DIAGNOSIS — K5900 Constipation, unspecified: Secondary | ICD-10-CM | POA: Diagnosis not present

## 2017-12-30 DIAGNOSIS — Z79899 Other long term (current) drug therapy: Secondary | ICD-10-CM

## 2017-12-30 DIAGNOSIS — F331 Major depressive disorder, recurrent, moderate: Secondary | ICD-10-CM | POA: Diagnosis not present

## 2017-12-30 DIAGNOSIS — I10 Essential (primary) hypertension: Secondary | ICD-10-CM

## 2017-12-30 DIAGNOSIS — Z0000 Encounter for general adult medical examination without abnormal findings: Secondary | ICD-10-CM

## 2017-12-30 DIAGNOSIS — Z1211 Encounter for screening for malignant neoplasm of colon: Secondary | ICD-10-CM

## 2017-12-30 DIAGNOSIS — Z87440 Personal history of urinary (tract) infections: Secondary | ICD-10-CM

## 2017-12-30 DIAGNOSIS — G20 Parkinson's disease: Secondary | ICD-10-CM

## 2017-12-30 DIAGNOSIS — N390 Urinary tract infection, site not specified: Secondary | ICD-10-CM | POA: Diagnosis not present

## 2017-12-30 LAB — BASIC METABOLIC PANEL
ANION GAP: 7 (ref 5–15)
BUN: 19 mg/dL (ref 6–20)
CO2: 28 mmol/L (ref 22–32)
Calcium: 9.3 mg/dL (ref 8.9–10.3)
Chloride: 106 mmol/L (ref 101–111)
Creatinine, Ser: 1.07 mg/dL — ABNORMAL HIGH (ref 0.44–1.00)
GFR calc Af Amer: 59 mL/min — ABNORMAL LOW (ref >60)
GFR, EST NON AFRICAN AMERICAN: 51 mL/min — AB (ref >60)
GLUCOSE: 110 mg/dL — AB (ref 65–99)
POTASSIUM: 4.4 mmol/L (ref 3.5–5.1)
SODIUM: 141 mmol/L (ref 135–145)

## 2017-12-30 LAB — POCT URINALYSIS DIPSTICK
Bilirubin, UA: NEGATIVE
GLUCOSE UA: NEGATIVE
NITRITE UA: POSITIVE
PROTEIN UA: NEGATIVE
Spec Grav, UA: 1.025 (ref 1.010–1.025)
Urobilinogen, UA: 1 E.U./dL
pH, UA: 6.5 (ref 5.0–8.0)

## 2017-12-30 MED ORDER — SULFAMETHOXAZOLE-TRIMETHOPRIM 800-160 MG PO TABS: 1.0000 | ORAL_TABLET | Freq: Two times a day (BID) | ORAL | 0 refills | Status: AC

## 2017-12-30 NOTE — Patient Instructions (Addendum)
Mrs. Flitton,  It was  a pleasure seeing you today.  You have a urinary tract infection. I have prescribed you 3 days of a medication called Bactrim. Take 1 tablet of bactrim twice daily for 3 days. I have sent this to your pharmacy.   I am going to call and speak to your neurologist about starting a a antidepressant. I will call you with the information regarding that prescription. Once you start the medication I will have you follow up in clinic in about 1 month or so to see how you are feeling.   I will also call you with the results of your labs.   Continue all other medications has prescribed I have made no changes.   You can take over the counter Vitamin D and Calcium for bone health. You an also consider taking a multivitamin.

## 2017-12-30 NOTE — Assessment & Plan Note (Signed)
Patient complaining of dysuria, intermittent cloudy and foul-smelling urine.  Denies fevers, chills, abdominal pain, or back pain.  Symptoms been going on for about 1 week duration.  Denies vaginal discharge.  Urine dipstick positive for nitrites and leukocytes.  Patient's abdominal exam was nonfocal, no tenderness, no CVA tenderness.  She was afebrile with stable vital signs.   Patient had UTI in March 2018.  Urine culture grew E. coli sensitive to Bactrim.  Plan: -Will empirically treat with Bactrim for 3 days duration -Will follow-up urine culture and adjust antibiotics if needed

## 2017-12-30 NOTE — Assessment & Plan Note (Signed)
Stable, continues to follow with Dr. Anne Hahn. Patient states her memory is getting worse and she is easily distracted. Also has been having visual hallucinations. She sees her old cat and a native [place] man in farm clothes that will be peering in the window. These hallucinations do not frighten for concern her. She denies recent falls or gait disturbances. Has been working in her and her husbands garden without difficulty. Still experiencing sleep disturbances, which she attributes to her leg cramps/restless leg. Denies nightmares.   Plan: -She follows closely with Dr. Anne Hahn -Continue Requip, Sinemet, and Klonopin per Dr. Anne Hahn

## 2017-12-30 NOTE — Assessment & Plan Note (Signed)
Provided stool cards today. Will f/u results.

## 2017-12-30 NOTE — Assessment & Plan Note (Addendum)
Patient quite tearful during exam today.  PHQ 9 score is 10, which indicates moderate depression symptoms.  She is quite tearful about her son, who has suffered strokes and lives in a nursing home.  She states she frequently has "pity parties."  She describes these as episodes of crying, which typically resolve and she moves on with her daily activities.   Discussed the option of starting antidepressant with the patient.  She seems interested in this option.  Also offered her social work consult for therapy referral.  She is not interested in therapy at this time.  Plan: I believe the patient would benefit from antidepressant.  Given her history of Parkinson's disease and to be in multiple CNS medications including Sinemet, Requip, and Klonopin, I will contact her neurologist to ensure safety of SSRI.   Addendum:  Dr. Anne Hahn approved initiation of SSRI. Some studies showed that Zoloft did not cause significant motor dysfunction. Will start Zoloft 25 mg daily. Plan for patient to follow up in one month.

## 2017-12-30 NOTE — Progress Notes (Signed)
   CC: Hypertension, depression, Parkinson's disease  HPI:  Christina Stevenson is a 72 y.o. female with past medical history as documented below resented for follow-up of hypertension, depression, Parkinson's disease.  Please see encounter based charting for detailed description of the patient's acute and chronic medical problems.  Past Medical History:  Diagnosis Date  . Allergic rhinitis, cause unspecified   . Anxiety   . Bundle branch block, unspecified   . Cervical spondylosis without myelopathy 08/08/2015  . Depression 08/2009   multiple stressors: including son who has suffered 2 CVA's and is in a rehab facility, financial difficulties, loss of pets, etc.  . Depression   . Dermatitis   . GERD (gastroesophageal reflux disease)    on bid omeprazole  . HERPES ZOSTER 09/11/2009   Qualifier: Diagnosis of  By: Coralee Pesa MD, Levada Schilling   . History of shingles   . Hyperlipemia   . Hypertension   . Irritable bowel syndrome   . Memory disturbance   . Nocturnal leg cramps 11/19/2016  . 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 Obesity   . Osteoarthrosis, unspecified whether generalized or localized, unspecified site   . Parkinson disease (HCC) 06/02/12   Probable; being follwed by Dr. Anne Hahn of [facility], has been started on Requip  . REM sleep behavior disorder   . Sciatica of right side   . Spinal stenosis in cervical region   . Uterovaginal prolapse, incomplete    with cystocele   Review of Systems:   Review of Systems  Constitutional: Positive for malaise/fatigue. Negative for chills and fever.  Respiratory: Negative for cough and shortness of breath.   Cardiovascular: Negative for chest pain.  Gastrointestinal: Positive for constipation. Negative for abdominal pain, diarrhea, nausea and vomiting.  Psychiatric/Behavioral: Positive for depression, hallucinations and memory loss. Negative for suicidal ideas.    Physical Exam:  Vitals:   12/30/17 1439  BP: (!) 124/45  Pulse: 62  Temp: 97.8  F (36.6 C)  TempSrc: Oral  SpO2: 100%  Weight: 159 lb 6.4 oz (72.3 kg)  Height: 5' (1.524 m)   Physical Exam  Constitutional: She is oriented to person, place, and time. She appears well-developed and well-nourished. No distress.  HENT:  Head: Normocephalic and atraumatic.  Mouth/Throat: Oropharynx is clear and moist.  Eyes: Conjunctivae are normal. No scleral icterus.  Neck: Normal range of motion. Neck supple.  Cardiovascular: Normal rate, regular rhythm and normal heart sounds.  Pulmonary/Chest: Effort normal and breath sounds normal. No respiratory distress. She has no wheezes.  Abdominal: Soft. Bowel sounds are normal. There is no tenderness.  Musculoskeletal: She exhibits no edema.  Neurological: She is alert and oriented to person, place, and time. No cranial nerve deficit.  Psychiatric: She has a normal mood and affect.  Tearful.    Assessment & Plan:   See Encounters Tab for problem based charting.  Patient discussed with Dr. Heide Spark

## 2017-12-30 NOTE — Assessment & Plan Note (Addendum)
BP Readings from Last 3 Encounters:  12/30/17 (!) 124/45  10/05/17 129/73  07/01/17 134/60    BP well controlled on current regimen. Mildly hypotensive today. Patient denies lightheadedness or dizziness upon standing.   Plan: -Continue quinapril 20 mg daily -BMP today - WNL, Creatinine stable from prior, lytes normal

## 2017-12-31 NOTE — Progress Notes (Signed)
Internal Medicine Clinic Attending  Case discussed with Dr. LaCroce at the time of the visit.  We reviewed the resident's history and exam and pertinent patient test results.  I agree with the assessment, diagnosis, and plan of care documented in the resident's note.  

## 2018-01-01 LAB — URINE CULTURE

## 2018-01-01 MED ORDER — SERTRALINE HCL 25 MG PO TABS: 25.0000 mg | ORAL_TABLET | Freq: Every day | ORAL | 3 refills | Status: DC

## 2018-01-01 NOTE — Addendum Note (Signed)
Addended by: Toney Rakes on: 01/01/2018 03:02 PM   Modules accepted: Orders

## 2018-01-04 DIAGNOSIS — Z1211 Encounter for screening for malignant neoplasm of colon: Secondary | ICD-10-CM | POA: Diagnosis not present

## 2018-01-05 ENCOUNTER — Other Ambulatory Visit: Payer: Self-pay | Admitting: Internal Medicine

## 2018-01-05 ENCOUNTER — Encounter: Payer: Self-pay | Admitting: Internal Medicine

## 2018-01-05 DIAGNOSIS — B9689 Other specified bacterial agents as the cause of diseases classified elsewhere: Secondary | ICD-10-CM

## 2018-01-05 DIAGNOSIS — N760 Acute vaginitis: Principal | ICD-10-CM

## 2018-01-05 MED ORDER — METRONIDAZOLE 500 MG PO TABS
500.0000 mg | ORAL_TABLET | Freq: Two times a day (BID) | ORAL | 0 refills | Status: AC
Start: 2018-01-05 — End: 2018-01-12

## 2018-01-05 MED ORDER — FLUCONAZOLE 150 MG PO TABS: 150.0000 mg | ORAL_TABLET | Freq: Every day | ORAL | 0 refills | Status: DC

## 2018-01-05 NOTE — Telephone Encounter (Signed)
  Reason for call:   I placed an outgoing call to Ms. Payton Mccallum at 11:45 AM regarding a my chart message I received stating she was still experiencing itching and pain with urination. She was recently diagnosed with a UTI and prescribed 3 days of Bactrim. Urine culture grew E. Coli and was sensitive to the bactrim. Patient experiencing symptoms of vaginal itching, irritation, and scant vaginal discharge. I offered the patient a clinic appointment this afternoon, as well as tomorrow morning. She declined and stated she does not know the next time she will be able to come to clinic. States she has had some family tragedies and responsibilities regarding her son who lives in a nursing home. I told the patient that I would prefer her to come to clinic for repeat urine analysis/culture and vaginal swab. She declined.    Assessment/ Plan:   I do not think patient's symptoms are consistent with persistent urinary tract infection, and her vaginal irritation, itching, and discharge are more consistent with either bacterial vaginosis or vaginal candidiasis   I would have preferred the patient come to clinic for repeat UA, urine culture, and vaginal swab, but she is not willing at this time  Will empirically treat the patient with diflucan 150 mg x 1 and metronidazole 500 mg BID for 7 days.   As always, pt is advised that if symptoms worsen or new symptoms arise such as fever, they should go to an urgent care facility or to to ER for further evaluation.   Toney Rakes, MD   01/05/2018, 12:58 PM

## 2018-01-06 ENCOUNTER — Encounter: Payer: Self-pay | Admitting: Internal Medicine

## 2018-01-14 ENCOUNTER — Other Ambulatory Visit: Payer: Medicare Other

## 2018-01-14 DIAGNOSIS — Z1211 Encounter for screening for malignant neoplasm of colon: Secondary | ICD-10-CM

## 2018-01-14 DIAGNOSIS — Z0000 Encounter for general adult medical examination without abnormal findings: Secondary | ICD-10-CM

## 2018-01-18 ENCOUNTER — Other Ambulatory Visit: Payer: Self-pay | Admitting: Neurology

## 2018-01-18 ENCOUNTER — Other Ambulatory Visit: Payer: Self-pay | Admitting: Internal Medicine

## 2018-01-18 DIAGNOSIS — L300 Nummular dermatitis: Secondary | ICD-10-CM

## 2018-01-18 LAB — FECAL OCCULT BLOOD, IMMUNOCHEMICAL: Fecal Occult Bld: NEGATIVE

## 2018-01-18 NOTE — Telephone Encounter (Signed)
Rx registry checked. Clonazepam last filled on 10/05/17 for #[id number]. Last OV 10/05/17 and next OV 03/10/18.

## 2018-01-19 NOTE — Telephone Encounter (Signed)
Rx electronically sent by Dr. Anne HahnWillis

## 2018-03-03 ENCOUNTER — Other Ambulatory Visit: Payer: Self-pay | Admitting: Oncology

## 2018-03-03 ENCOUNTER — Other Ambulatory Visit: Payer: Self-pay | Admitting: Internal Medicine

## 2018-03-03 DIAGNOSIS — L300 Nummular dermatitis: Secondary | ICD-10-CM

## 2018-03-08 ENCOUNTER — Encounter: Payer: Self-pay | Admitting: *Deleted

## 2018-03-10 ENCOUNTER — Ambulatory Visit: Payer: Medicare Other | Admitting: Neurology

## 2018-03-10 ENCOUNTER — Encounter: Payer: Self-pay | Admitting: Neurology

## 2018-03-10 ENCOUNTER — Other Ambulatory Visit: Payer: Self-pay

## 2018-03-10 VITALS — BP 139/69 | HR 97 | Ht 60.0 in | Wt 161.0 lb

## 2018-03-10 DIAGNOSIS — G4752 REM sleep behavior disorder: Secondary | ICD-10-CM | POA: Diagnosis not present

## 2018-03-10 DIAGNOSIS — G20 Parkinson's disease: Secondary | ICD-10-CM | POA: Diagnosis not present

## 2018-03-10 MED ORDER — ROPINIROLE HCL 3 MG PO TABS: 3.0000 mg | ORAL_TABLET | Freq: Three times a day (TID) | ORAL | 3 refills | Status: DC

## 2018-03-10 MED ORDER — CARBIDOPA-LEVODOPA 25-100 MG PO TABS: 1.5000 | ORAL_TABLET | Freq: Three times a day (TID) | ORAL | 2 refills | Status: DC

## 2018-03-10 NOTE — Patient Instructions (Signed)
   We will go up on the Sinemet tablet 25/100 taking 1.5 tablets three times a day.  Sinemet (carbidopa) may result in confusion or hallucinations, drowsiness, nausea, or dizziness. If any significant side effects are noted, please contact our office. Sinemet may not be well absorbed when taken with high protein meals, if tolerated it is best to take 30-45 minutes before you eat.

## 2018-03-10 NOTE — Progress Notes (Signed)
Reason for visit: Parkinson's disease  Christina Stevenson is an 72 y.o. female  History of present illness:  Ms. Christina Stevenson is a 72 year old right-handed white female with a history of Parkinson's disease.  The patient has tremors involving both upper extremities, she does have some mild gait instability, she fell 2 weeks ago but not sustained significant injury.  The patient will use a cane on occasion.  She does report some difficulty with swallowing even liquids first thing in the morning, but she does better later in the day.  She is having increasing problems with getting going in the morning, she has some arthritic pains in the arms and legs, particularly the left knee and thigh area.  She will take CBD oil for this which is beneficial.  The patient does have some hallucinations off and on, these are not threatening to her.  She oftentimes will have problems with insomnia at night.  She takes clonazepam for a REM sleep disorder.  She returns to the office today for an evaluation.  She is on low-dose Sinemet taking 25/100 mg tablets 3 times daily, she takes 3 mg of Requip 3 times daily.  She has been placed on low-dose Zoloft for some problems with depression.  Both of her sons are having medical issues.  Past Medical History:  Diagnosis Date  . Allergic rhinitis, cause unspecified   . Anxiety   . Bundle branch block, unspecified   . Cervical spondylosis without myelopathy 08/08/2015  . Depression 08/2009   multiple stressors: including son who has suffered 2 CVA's and is in a rehab facility, financial difficulties, loss of pets, etc.  . Depression   . Dermatitis   . GERD (gastroesophageal reflux disease)    on bid omeprazole  . HERPES ZOSTER 09/11/2009   Qualifier: Diagnosis of  By: Coralee PesaWoodyear MD, Levada SchillingWynne E   . History of shingles   . Hyperlipemia   . Hypertension   . Irritable bowel syndrome   . Memory disturbance   . Nocturnal leg cramps 11/19/2016  . 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  Obesity   . Osteoarthrosis, unspecified whether generalized or localized, unspecified site   . Parkinson disease (HCC) 06/02/12   Probable; being follwed by Dr. Anne HahnWillis of [facility], has been started on Requip  . REM sleep behavior disorder   . Sciatica of right side   . Spinal stenosis in cervical region   . Uterovaginal prolapse, incomplete    with cystocele    Past Surgical History:  Procedure Laterality Date  . ABDOMINAL HYSTERECTOMY    . bladder resuspension procedure    . CERVICAL LAMINECTOMY    . LUMBAR LAMINECTOMY    . Trigger finger surgery Right    Thumb    Family History  Problem Relation Age of Onset  . Heart Problems Father        lived to be 71 yo  . Cancer - Lung Sister     Social history:  reports that she has never smoked. She has never used smokeless tobacco. She reports that she does not drink alcohol or use drugs.    Allergies  Allergen Reactions  . Prednisone     Swelling, emotionally volatile   . Latex Rash    Medications:  Prior to Admission medications   Medication Sig Start Date End Date Taking? Authorizing Provider  atorvastatin (LIPITOR) 20 MG tablet Take 1 tablet (20 mg total) by mouth daily. 11/26/17  Yes Anne Shutteraines, Alexander N, MD  carbidopa-levodopa [facility](SINEMET  IR) 25-100 MG tablet TAKE 1 TABLET BY MOUTH 3  TIMES DAILY 01/18/18  Yes York Spaniel, MD  clobetasol ointment (TEMOVATE) 0.05 % APPLY TO AFFECTED AREA(S)  TOPICALLY TWO TIMES DAILY 03/03/18  Yes Tyson Alias, MD  clonazePAM (KLONOPIN) 0.5 MG tablet TAKE 3 TABLETS BY MOUTH AT  BEDTIME 01/18/18  Yes York Spaniel, MD  Cyanocobalamin (VITAMIN B-12 PO) Take 1 Dose by mouth. Pt does not take every day. Per patient, she takes "sometimes"   Yes [provider]  cyclobenzaprine (FLEXERIL) 10 MG tablet TAKE 1 TABLET BY MOUTH THREE TIMES DAILY AS NEEDED 07/15/17  Yes York Spaniel, MD  diphenhydrAMINE (BENADRYL) 25 mg capsule Take 25 mg by mouth every 6 (six) hours as needed.    Yes [provider]  fluconazole (DIFLUCAN) 150 MG tablet Take 1 tablet (150 mg total) by mouth daily. 01/05/18  Yes Lacroce, Ames Coupe, MD  ibuprofen (ADVIL,MOTRIN) 200 MG tablet Take 200 mg by mouth every 6 (six) hours as needed.   Yes [provider]  pantoprazole (PROTONIX) 20 MG tablet Take 1 tablet (20 mg total) by mouth 2 (two) times daily before a meal. 11/25/17  Yes Doneen Poisson, MD  polyethylene glycol (MIRALAX / GLYCOLAX) packet MIX CONTENTS OF 1 PACKET  (17GM) IN WATER AND DRINK  BY MOUTH DAILY 11/25/17  Yes Levert Feinstein, MD  quinapril (ACCUPRIL) 20 MG tablet TAKE 1 TABLET BY MOUTH  DAILY 03/03/18  Yes Tyson Alias, MD  rOPINIRole (REQUIP) 3 MG tablet TAKE 1 TABLET BY MOUTH 3  TIMES DAILY 04/09/17  Yes York Spaniel, MD  sertraline (ZOLOFT) 25 MG tablet Take 1 tablet (25 mg total) by mouth daily for 120 doses. 01/01/18 05/01/18 Yes Lacroce, Ames Coupe, MD    ROS:  Out of a complete 14 system review of symptoms, the patient complains only of the following symptoms, and all other reviewed systems are negative.  Hallucinations Tremors Depression  Blood pressure 139/69, pulse 97, height 5' (1.524 m), weight 161 lb (73 kg).  Physical Exam  General: The patient is alert and cooperative at the time of the examination.  Skin: No significant peripheral edema is noted.   Neurologic Exam  Mental status: The patient is alert and oriented x 3 at the time of the examination. The patient has apparent normal recent and remote memory, with an apparently normal attention span and concentration ability.   Cranial nerves: Facial symmetry is present. Speech is normal, no aphasia or dysarthria is noted. Extraocular movements are full. Visual fields are full.  Motor: The patient has good strength in all 4 extremities.  Sensory examination: Soft touch sensation is symmetric on the face, arms, and legs.  Coordination: The patient has good  finger-nose-finger and heel-to-shin bilaterally.  Gait and station: The patient has a normal gait. Tandem gait is normal. Romberg is negative. No drift is seen.  Reflexes: Deep tendon reflexes are symmetric.   Assessment/Plan:  1.  Parkinson's disease  2.  Mild gait disorder  The patient will be increased on the Sinemet taking 1.5 of the 25/100 mg tablets 3 times daily.  She will need to look out for increasing confusion and hallucinations.  The patient does report some daytime drowsiness but she does not sleep well at night.  The patient is relatively inactive during the day, I have encouraged her to increase her level of activity.  The patient will follow-up in about 5 months.  A prescription was sent  in for the Requip and for the Sinemet.   Marlan Palau MD 03/10/2018 9:46 AM  [facility] [address] [place], [place] [address]  Phone [phone] Fax [phone]

## 2018-04-05 DIAGNOSIS — H25093 Other age-related incipient cataract, bilateral: Secondary | ICD-10-CM | POA: Diagnosis not present

## 2018-04-09 ENCOUNTER — Other Ambulatory Visit: Payer: Self-pay | Admitting: *Deleted

## 2018-04-09 DIAGNOSIS — F331 Major depressive disorder, recurrent, moderate: Secondary | ICD-10-CM

## 2018-04-11 MED ORDER — SERTRALINE HCL 25 MG PO TABS: 25.0000 mg | ORAL_TABLET | Freq: Every day | ORAL | 3 refills | Status: DC

## 2018-05-07 ENCOUNTER — Emergency Department (HOSPITAL_COMMUNITY)
Admission: EM | Admit: 2018-05-07 | Discharge: 2018-05-07 | Disposition: A | Discharge status: Home / Self Care | Payer: Medicare Other | Attending: Emergency Medicine | Admitting: Emergency Medicine

## 2018-05-07 ENCOUNTER — Other Ambulatory Visit: Payer: Self-pay

## 2018-05-07 ENCOUNTER — Encounter (HOSPITAL_COMMUNITY): Payer: Self-pay

## 2018-05-07 ENCOUNTER — Emergency Department (HOSPITAL_COMMUNITY): Payer: Medicare Other

## 2018-05-07 DIAGNOSIS — R4182 Altered mental status, unspecified: Secondary | ICD-10-CM | POA: Insufficient documentation

## 2018-05-07 DIAGNOSIS — I454 Nonspecific intraventricular block: Secondary | ICD-10-CM | POA: Diagnosis not present

## 2018-05-07 DIAGNOSIS — Z79899 Other long term (current) drug therapy: Secondary | ICD-10-CM | POA: Insufficient documentation

## 2018-05-07 DIAGNOSIS — I10 Essential (primary) hypertension: Secondary | ICD-10-CM | POA: Diagnosis not present

## 2018-05-07 DIAGNOSIS — R410 Disorientation, unspecified: Secondary | ICD-10-CM | POA: Diagnosis not present

## 2018-05-07 DIAGNOSIS — J40 Bronchitis, not specified as acute or chronic: Secondary | ICD-10-CM | POA: Diagnosis not present

## 2018-05-07 DIAGNOSIS — R791 Abnormal coagulation profile: Secondary | ICD-10-CM | POA: Diagnosis not present

## 2018-05-07 DIAGNOSIS — R531 Weakness: Secondary | ICD-10-CM | POA: Diagnosis present

## 2018-05-07 DIAGNOSIS — R0902 Hypoxemia: Secondary | ICD-10-CM | POA: Diagnosis not present

## 2018-05-07 DIAGNOSIS — F4489 Other dissociative and conversion disorders: Secondary | ICD-10-CM

## 2018-05-07 DIAGNOSIS — E785 Hyperlipidemia, unspecified: Secondary | ICD-10-CM | POA: Insufficient documentation

## 2018-05-07 DIAGNOSIS — N390 Urinary tract infection, site not specified: Secondary | ICD-10-CM | POA: Diagnosis not present

## 2018-05-07 DIAGNOSIS — J209 Acute bronchitis, unspecified: Secondary | ICD-10-CM | POA: Diagnosis not present

## 2018-05-07 LAB — URINALYSIS, ROUTINE W REFLEX MICROSCOPIC
Bilirubin Urine: NEGATIVE
Glucose, UA: NEGATIVE mg/dL
Ketones, ur: 5 mg/dL — AB
NITRITE: NEGATIVE
PROTEIN: NEGATIVE mg/dL
Specific Gravity, Urine: 1.013 (ref 1.005–1.030)
pH: 6 (ref 5.0–8.0)

## 2018-05-07 LAB — CBC WITH DIFFERENTIAL/PLATELET
Abs Immature Granulocytes: 0 10*3/uL (ref 0.0–0.1)
BASOS ABS: 0 10*3/uL (ref 0.0–0.1)
Basophils Relative: 0 %
EOS ABS: 0.2 10*3/uL (ref 0.0–0.7)
EOS PCT: 2 %
HEMATOCRIT: 34.3 % — AB (ref 36.0–46.0)
HEMOGLOBIN: 11.3 g/dL — AB (ref 12.0–15.0)
Immature Granulocytes: 1 %
LYMPHS ABS: 0.4 10*3/uL — AB (ref 0.7–4.0)
LYMPHS PCT: 5 %
MCH: 29.5 pg (ref 26.0–34.0)
MCHC: 32.9 g/dL (ref 30.0–36.0)
MCV: 89.6 fL (ref 78.0–100.0)
MONO ABS: 0.5 10*3/uL (ref 0.1–1.0)
Monocytes Relative: 7 %
Neutro Abs: 7.2 10*3/uL (ref 1.7–7.7)
Neutrophils Relative %: 85 %
Platelets: 162 10*3/uL (ref 150–400)
RBC: 3.83 MIL/uL — AB (ref 3.87–5.11)
RDW: 12.3 % (ref 11.5–15.5)
WBC: 8.4 10*3/uL (ref 4.0–10.5)

## 2018-05-07 LAB — I-STAT CG4 LACTIC ACID, ED
Lactic Acid, Venous: 2.36 mmol/L (ref 0.5–1.9)
Lactic Acid, Venous: 0.85 mmol/L (ref 0.5–1.9)

## 2018-05-07 LAB — COMPREHENSIVE METABOLIC PANEL
ALK PHOS: 74 U/L (ref 38–126)
ANION GAP: 9 (ref 5–15)
AST: 21 U/L (ref 15–41)
Albumin: 3.7 g/dL (ref 3.5–5.0)
BILIRUBIN TOTAL: 1.5 mg/dL — AB (ref 0.3–1.2)
BUN: 15 mg/dL (ref 8–23)
CALCIUM: 9.1 mg/dL (ref 8.9–10.3)
CO2: 26 mmol/L (ref 22–32)
CREATININE: 0.93 mg/dL (ref 0.44–1.00)
Chloride: 102 mmol/L (ref 98–111)
GFR calc non Af Amer: >60 mL/min (ref >60)
Glucose, Bld: 184 mg/dL — ABNORMAL HIGH (ref 70–99)
Potassium: 3.9 mmol/L (ref 3.5–5.1)
Sodium: 137 mmol/L (ref 135–145)
TOTAL PROTEIN: 7.4 g/dL (ref 6.5–8.1)

## 2018-05-07 LAB — INFLUENZA PANEL BY PCR (TYPE A & B)
Influenza A By PCR: NEGATIVE
Influenza B By PCR: NEGATIVE

## 2018-05-07 LAB — PROTIME-INR
INR: 1.07
PROTHROMBIN TIME: 13.8 s (ref 11.4–15.2)

## 2018-05-07 MED ORDER — ACETAMINOPHEN 325 MG PO TABS
650.0000 mg | ORAL_TABLET | Freq: Once | ORAL | Status: AC
  Administered 2018-05-07: 650 mg via ORAL
  Filled 2018-05-07: qty 2

## 2018-05-07 MED ORDER — SODIUM CHLORIDE 0.9 % IV BOLUS (SEPSIS)
1000.0000 mL | Freq: Once | INTRAVENOUS | Status: AC
  Administered 2018-05-07: 1000 mL via INTRAVENOUS

## 2018-05-07 MED ORDER — CEFPODOXIME PROXETIL 200 MG PO TABS: 200.0000 mg | ORAL_TABLET | Freq: Two times a day (BID) | ORAL | 0 refills | Status: DC

## 2018-05-07 MED ORDER — SODIUM CHLORIDE 0.9 % IV BOLUS (SEPSIS)
250.0000 mL | Freq: Once | INTRAVENOUS | Status: AC
  Administered 2018-05-07: 250 mL via INTRAVENOUS

## 2018-05-07 MED ORDER — SODIUM CHLORIDE 0.9 % IV SOLN
500.0000 mg | INTRAVENOUS | Status: DC
  Administered 2018-05-07: 500 mg via INTRAVENOUS
  Filled 2018-05-07 (×2): qty 500

## 2018-05-07 MED ORDER — SODIUM CHLORIDE 0.9 % IV SOLN
1.0000 g | INTRAVENOUS | Status: DC
  Administered 2018-05-07: 1 g via INTRAVENOUS
  Filled 2018-05-07: qty 10

## 2018-05-07 NOTE — ED Provider Notes (Signed)
[facility] EMERGENCY DEPARTMENT Provider Note   CSN: [id number] Arrival date & time: 05/07/18  1536     History   Chief Complaint Chief Complaint  Patient presents with  . Altered Mental Status  . Fever    HPI Christina Stevenson is a 72 y.o. female.  HPI   She presents for evaluation of weakness, shuffling gait, and altered mental status.  Symptoms gradual in onset but worse today.  She took her usual medicines today but did not eat much.  She typically walks with a hand-held cane, she does not use a walker at home.  Her husband is with her and is her caregiver.  He called the ambulance today because of worsening status.  She had a cough recently, for which he started giving her Allegra for possible allergy symptoms.  She has not had rhinorrhea or sneezing.  There is been no nausea, vomiting, dysuria or diarrhea/constipation.  No other recent illnesses or change in her chronic medications or treatments.  There are no other known modifying factors.  Past Medical History:  Diagnosis Date  . Allergic rhinitis, cause unspecified   . Anxiety   . Bundle branch block, unspecified   . Cervical spondylosis without myelopathy 08/08/2015  . Depression 08/2009   multiple stressors: including son who has suffered 2 CVA's and is in a rehab facility, financial difficulties, loss of pets, etc.  . Depression   . Dermatitis   . GERD (gastroesophageal reflux disease)    on bid omeprazole  . HERPES ZOSTER 09/11/2009   Qualifier: Diagnosis of  By: Coralee Pesa MD, Levada Schilling   . History of shingles   . Hyperlipemia   . Hypertension   . Irritable bowel syndrome   . Memory disturbance   . Nocturnal leg cramps 11/19/2016  . 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 Obesity   . Osteoarthrosis, unspecified whether generalized or localized, unspecified site   . Parkinson disease (HCC) 06/02/12   Probable; being follwed by Dr. Anne Hahn of [facility], has been started on Requip  . REM sleep behavior disorder    . Sciatica of right side   . Spinal stenosis in cervical region   . Uterovaginal prolapse, incomplete    with cystocele    Patient Active Problem List   Diagnosis Date Noted  . 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 Constipation 07/01/2017  . Nocturnal leg cramps 11/19/2016  . UTI (urinary tract infection) 11/12/2016  . Cervical spondylosis without myelopathy 08/08/2015  . Pins and needles sensation 02/22/2015  . Headache 02/22/2015  . Memory loss 10/05/2012  . 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 Parkinson disease (HCC) 04/15/2012  . Dysuria 12/24/2011  . Heart palpitations 12/24/2011  . Hot flashes 12/24/2011  . Dysphagia 02/11/2011  . Preventative health care 01/14/2011  . BACK PAIN 07/09/2010  . Depression 08/18/2009  . HLD (hyperlipidemia) 09/16/2006  . Essential hypertension 09/16/2006  . Allergic rhinitis 09/16/2006  . GERD 09/16/2006    Past Surgical History:  Procedure Laterality Date  . ABDOMINAL HYSTERECTOMY    . bladder resuspension procedure    . CERVICAL LAMINECTOMY    . LUMBAR LAMINECTOMY    . Trigger finger surgery Right    Thumb     OB History   None      Home Medications    Prior to Admission medications   Medication Sig Start Date End Date Taking? Authorizing Provider  atorvastatin (LIPITOR) 20 MG tablet Take 1 tablet (20 mg total) by mouth daily. 11/26/17  Yes Anne Shutter, MD  carbidopa-levodopa (SINEMET IR) 25-100 MG tablet Take 1.5 tablets by mouth 3 (three) times daily. 03/10/18  Yes York Spaniel, MD  clobetasol ointment (TEMOVATE) 0.05 % APPLY TO AFFECTED AREA(S)  TOPICALLY TWO TIMES DAILY 03/03/18  Yes Tyson Alias, MD  clonazePAM (KLONOPIN) 0.5 MG tablet TAKE 3 TABLETS BY MOUTH AT  BEDTIME Patient taking differently: Take 1 mg by mouth at bedtime.  01/18/18  Yes York Spaniel, MD  cyclobenzaprine (FLEXERIL) 10 MG tablet TAKE 1 TABLET BY MOUTH THREE TIMES DAILY AS NEEDED 07/15/17  Yes York Spaniel, MD    diphenhydrAMINE (BENADRYL) 25 mg capsule Take 25 mg by mouth every 6 (six) hours as needed.   Yes [provider]  ibuprofen (ADVIL,MOTRIN) 200 MG tablet Take 200 mg by mouth every 6 (six) hours as needed for mild pain.    Yes [provider]  pantoprazole (PROTONIX) 20 MG tablet Take 1 tablet (20 mg total) by mouth 2 (two) times daily before a meal. 11/25/17  Yes Doneen Poisson, MD  quinapril (ACCUPRIL) 20 MG tablet TAKE 1 TABLET BY MOUTH  DAILY 03/03/18  Yes Tyson Alias, MD  rOPINIRole (REQUIP) 3 MG tablet Take 1 tablet (3 mg total) by mouth 3 (three) times daily. 03/10/18  Yes York Spaniel, MD  sertraline (ZOLOFT) 25 MG tablet Take 1 tablet (25 mg total) by mouth daily for 120 doses. 04/11/18 08/09/18 Yes Seawell, Jaimie A, DO  cefpodoxime (VANTIN) 200 MG tablet Take 1 tablet (200 mg total) by mouth 2 (two) times daily. 05/07/18   Mancel Bale, MD  polyethylene glycol (MIRALAX / Ethelene Hal) packet MIX CONTENTS OF 1 PACKET  (17GM) IN WATER AND DRINK  BY MOUTH DAILY Patient not taking: Reported on 05/07/2018 11/25/17   Levert Feinstein, MD    Family History Family History  Problem Relation Age of Onset  . Heart Problems Father        lived to be 5 yo  . Cancer - Lung Sister     Social History Social History   Tobacco Use  . Smoking status: Never Smoker  . Smokeless tobacco: Never Used  Substance Use Topics  . Alcohol use: No    Comment: Occasional  . Drug use: No     Allergies   Penicillins; Prednisone; and Latex   Review of Systems Review of Systems  All other systems reviewed and are negative.    Physical Exam Updated Vital Signs BP 120/68   Pulse 89   Temp 98.7 F (37.1 C) (Oral)   Resp 17   Ht 5\' 1"  (1.549 m)   Wt 71.2 kg   SpO2 96%   BMI 29.66 kg/m   Physical Exam  Constitutional: She appears well-developed.  Elderly, frail  HENT:  Head: Normocephalic and atraumatic.  Eyes: Pupils are equal, round, and reactive to  light. Conjunctivae and EOM are normal.  Neck: Normal range of motion and phonation normal. Neck supple.  Cardiovascular: Normal rate and regular rhythm.  Pulmonary/Chest: Effort normal. She exhibits no tenderness.  Rales, no rhonchi or wheezes.  No increased work of breathing.  Abdominal: Soft. She exhibits no distension. There is no tenderness. There is no guarding.  Musculoskeletal: Normal range of motion.  Normal strength arms and legs bilaterally.  Neurological: She is alert. She exhibits normal muscle tone.  No dysarthria, or aphasia.  She follows commands accurately.  Skin: Skin is warm and dry.  Psychiatric: She has a normal  mood and affect. Her behavior is normal.  Nursing note and vitals reviewed.    ED Treatments / Results  Labs (all labs ordered are listed, but only abnormal results are displayed) Labs Reviewed  COMPREHENSIVE METABOLIC PANEL - Abnormal; Notable for the following components:      Result Value   Glucose, Bld 184 (*)    Total Bilirubin 1.5 (*)    All other components within normal limits  CBC WITH DIFFERENTIAL/PLATELET - Abnormal; Notable for the following components:   RBC 3.83 (*)    Hemoglobin 11.3 (*)    HCT 34.3 (*)    Lymphs Abs 0.4 (*)    All other components within normal limits  URINALYSIS, ROUTINE W REFLEX MICROSCOPIC - Abnormal; Notable for the following components:   APPearance HAZY (*)    Hgb urine dipstick SMALL (*)    Ketones, ur 5 (*)    Leukocytes, UA LARGE (*)    WBC, UA >50 (*)    Bacteria, UA MANY (*)    Non Squamous Epithelial 0-5 (*)    All other components within normal limits  I-STAT CG4 LACTIC ACID, ED - Abnormal; Notable for the following components:   Lactic Acid, Venous 2.36 (*)    All other components within normal limits  CULTURE, BLOOD (ROUTINE X 2)  CULTURE, BLOOD (ROUTINE X 2)  URINE CULTURE  PROTIME-INR  INFLUENZA PANEL BY PCR (TYPE A & B)  I-STAT CG4 LACTIC ACID, ED    EKG None  Radiology Dg Chest 2  View  Result Date: 05/07/2018 CLINICAL DATA:  Hallucinations, weakness. History of Parkinson's disease. EXAM: CHEST - 2 VIEW COMPARISON:  Chest radiograph August 31, 2005 FINDINGS: Cardiac silhouette is mildly enlarged, unchanged. Calcified aortic arch. Mediastinal silhouette is not suspicious. Chronic bronchitic changes with bandlike density LEFT lung base. No pleural effusion or focal consolidation. No pneumothorax. ACDF. Large amount of retained large bowel stool included abdomen. IMPRESSION: 1. Chronic bronchitic changes with LEFT lung base atelectasis/scarring. 2. Large amount of retained large bowel stool, incompletely quantified. 3.  Aortic Atherosclerosis (ICD10-I70.0). Electronically Signed   By: Awilda Metroourtnay  Bloomer M.D.   On: 05/07/2018 17:19    Procedures .Critical Care Performed by: Mancel BaleWentz, Azha Constantin, MD Authorized by: Mancel BaleWentz, Adarius Tigges, MD   Critical care provider statement:    Critical care time (minutes):  35   Critical care start time:  05/07/2018 4:10 PM   Critical care end time:  05/07/2018 7:39 PM   Critical care time was exclusive of:  Separately billable procedures and treating other patients   Critical care was necessary to treat or prevent imminent or life-threatening deterioration of the following conditions:  Circulatory failure   Critical care was time spent personally by me on the following activities:  Blood draw for specimens, development of treatment plan with patient or surrogate, discussions with consultants, evaluation of patient's response to treatment, examination of patient, obtaining history from patient or surrogate, ordering and performing treatments and interventions, ordering and review of laboratory studies, pulse oximetry, re-evaluation of patient's condition, review of old charts and ordering and review of radiographic studies   (including critical care time)  Medications Ordered in ED Medications  azithromycin (ZITHROMAX) 500 mg in sodium chloride 0.9 % 250  mL IVPB (0 mg Intravenous Stopped 05/07/18 1928)  cefTRIAXone (ROCEPHIN) 1 g in sodium chloride 0.9 % 100 mL IVPB (0 g Intravenous Stopped 05/07/18 1928)  acetaminophen (TYLENOL) tablet 650 mg (650 mg Oral Given 05/07/18 1611)  sodium chloride 0.9 % bolus  1,000 mL (0 mLs Intravenous Stopped 05/07/18 1928)    And  sodium chloride 0.9 % bolus 1,000 mL (0 mLs Intravenous Stopped 05/07/18 1928)    And  sodium chloride 0.9 % bolus 250 mL (250 mLs Intravenous New Bag/Given 05/07/18 1927)     Initial Impression / Assessment and Plan / ED Course  I have reviewed the triage vital signs and the nursing notes.  Pertinent labs & imaging results that were available during my care of the patient were reviewed by me and considered in my medical decision making (see chart for details).  Clinical Course as of May 07 1946  Fri May 07, 2018  1909 Normal improved from earlier elevation.  I-Stat CG4 Lactic Acid, ED [EW]  1909 Normal except glucose high 184  Comprehensive metabolic panel(!) [EW]  1909 Normal  Influenza panel by PCR (type A & B) [EW]  1909 Normal except hemoglobin low  CBC with Differential(!) [EW]    Clinical Course User Index [EW] Mancel Bale, MD     Patient Vitals for the past 24 hrs:  BP Temp Temp src Pulse Resp SpO2 Height Weight  05/07/18 1845 120/68 - - 89 17 96 % - -  05/07/18 1745 130/68 - - 94 18 96 % - -  05/07/18 1734 - 98.7 F (37.1 C) Oral - - - - -  05/07/18 1700 (!) 148/70 - - 98 19 95 % - -  05/07/18 1645 (!) 143/72 - - 94 17 95 % - -  05/07/18 1600 140/65 - - 96 (!) 25 97 % - -  05/07/18 1553 - - - - - - 5\' 1"  (1.549 m) 71.2 kg  05/07/18 1550 - - - - - 96 % - -  05/07/18 1538 140/65 (!) 102.5 F (39.2 C) Oral (!) 103 16 98 % - -    7:38 PM Reevaluation with update and discussion. After initial assessment and treatment, an updated evaluation reveals she is more alert and interactive.  Husband states she is "much better."  Findings discussed and questions  answered. Mancel Bale   Medical Decision Making: Acute febrile illness with secondary confusion improved with IV fluids.  Evaluation is reassuring.  Likely urinary tract infection as source for fever and confusion.  Doubt pneumonia.  Influenza test negative.  No indication for further ED evaluation, treatment or hospitalization at this time.  CRITICAL CARE-yes Performed by: Mancel Bale   Nursing Notes Reviewed/ Care Coordinated Applicable Imaging Reviewed Interpretation of Laboratory Data incorporated into ED treatment  The patient appears reasonably screened and/or stabilized for discharge and I doubt any other medical condition or other [facility] requiring further screening, evaluation, or treatment in the ED at this time prior to discharge.  Plan: Home Medications-continue usual medicines, Tylenol for pain or fever; Home Treatments-rest, fluids; return here if the recommended treatment, does not improve the symptoms; Recommended follow up-he is to be checkup 1 week, follow-up on culture possibly repeat urinalysis.   Final Clinical Impressions(s) / ED Diagnoses   Final diagnoses:  Urinary tract infection without hematuria, site unspecified  Confusion state  Bronchitis    ED Discharge Orders         Ordered    cefpodoxime (VANTIN) 200 MG tablet  2 times daily     05/07/18 1941           Mancel Bale, MD 05/07/18 1948

## 2018-05-07 NOTE — Discharge Instructions (Signed)
The testing indicates that your fever and confusion today are from a urinary tract infection.  We are prescribing an antibiotic to pick up at your pharmacy tomorrow to start taking them to improve both a urinary tract infection and bronchitis/early pneumonia.  Take Tylenol every 4 hours for fever.  Make sure that you are drinking plenty of fluids and try to eat 3 meals each day.  Follow-up with your doctor for checkup next week.  Return here if needed, for problems.

## 2018-05-07 NOTE — ED Triage Notes (Signed)
Per EMS pt has hx parkinson and can ambulate with fww. Today pt husband reports pt is hallucinating and unable to ambulate with FWW due to weakness.

## 2018-05-07 NOTE — ED Notes (Signed)
Signature pad not working. Pt with no s/sx distress. Left with husband in wheel chair

## 2018-05-09 LAB — URINE CULTURE: Culture: >=100000 — AB

## 2018-05-10 ENCOUNTER — Telehealth: Payer: Self-pay | Admitting: Emergency Medicine

## 2018-05-10 NOTE — Telephone Encounter (Signed)
Post ED Visit - Positive Culture Follow-up  Culture report reviewed by antimicrobial stewardship pharmacist:  []  Enzo BiNathan Batchelder, Pharm.D. []  Celedonio MiyamotoJeremy Frens, Pharm.D., BCPS AQ-ID []  Garvin FilaMike Maccia, Pharm.D., BCPS []  Georgina PillionElizabeth Martin, Pharm.D., BCPS []  LakeMinh Pham, VermontPharm.D., BCPS, AAHIVP []  Estella HuskMichelle Turner, Pharm.D., BCPS, AAHIVP []  Lysle Pearlachel Rumbarger, PharmD, BCPS []  Phillips Climeshuy Dang, PharmD, BCPS [x]  Agapito GamesAlison Masters, PharmD, BCPS []  Verlan FriendsErin Deja, PharmD  Positive urine culture Treated with cefpodoxime, organism sensitive to the same and no further patient follow-up is required at this time.  Berle MullMiller, Elidia Bonenfant 05/10/2018, 3:44 PM

## 2018-05-12 LAB — CULTURE, BLOOD (ROUTINE X 2)
CULTURE: NO GROWTH
Culture: NO GROWTH
SPECIAL REQUESTS: ADEQUATE
Special Requests: ADEQUATE

## 2018-05-24 ENCOUNTER — Telehealth: Payer: Self-pay | Admitting: Neurology

## 2018-05-24 NOTE — Telephone Encounter (Signed)
Agree with work and revisit, the patient will require blood work and urinalysis, she was just recently treated for a bladder infection, need to make sure that no medical issues at work that would be increasing confusion.

## 2018-05-24 NOTE — Telephone Encounter (Signed)
Spoke to patient's husband who says his wife's confusion is much worse.  He would like for her to be seen sooner.  She has been added to Dr. Anne Hahn' schedule on 05/26/18 at noon.  They will arrive at 11:30am for check-in.  Dr. Terrace Arabia offered a low dose Seroquel to go ahead and start.  Her husband declined and would like to wait until she is seen by Dr. Anne Hahn.

## 2018-05-24 NOTE — Telephone Encounter (Signed)
Pt's husband Greggory Stallion said her symptoms have gotten worse recently. She put on a sweater for pj bottoms and 3 episodes where she did not know who he was. He is having to watch her 24/7 now. He is wanting to schedule an appt. He is aware Dr Anne Hahn is out of the office today, he is wanting to wait until tomorrow for Kara Mead to call him back.

## 2018-05-26 ENCOUNTER — Ambulatory Visit: Payer: Medicare Other | Admitting: Neurology

## 2018-05-26 ENCOUNTER — Encounter: Payer: Self-pay | Admitting: Neurology

## 2018-05-26 ENCOUNTER — Telehealth: Payer: Self-pay | Admitting: Neurology

## 2018-05-26 ENCOUNTER — Other Ambulatory Visit: Payer: Self-pay

## 2018-05-26 VITALS — BP 129/67 | HR 107 | Resp 20 | Ht 61.0 in | Wt 164.0 lb

## 2018-05-26 DIAGNOSIS — G20 Parkinson's disease: Secondary | ICD-10-CM | POA: Diagnosis not present

## 2018-05-26 DIAGNOSIS — E538 Deficiency of other specified B group vitamins: Secondary | ICD-10-CM | POA: Diagnosis not present

## 2018-05-26 DIAGNOSIS — R410 Disorientation, unspecified: Secondary | ICD-10-CM

## 2018-05-26 NOTE — Telephone Encounter (Signed)
Spoke to the patient husband and inform him I sent the CT order to [facility] and if he doesn't hear from them in the next 2-3 business to give them a call and he does have their number of [phone].Marland Kitchen  FYI He informed me that Katerine did the urine test but dumped out and they didn't stay to do the test again. He informed me that she has an appointment with her primary care doctor tomorrow morning and they will do the urine test then.

## 2018-05-26 NOTE — Telephone Encounter (Signed)
[employer] medicare order sent to [facility]. No auth they will reach out to the pt to schedule.  °

## 2018-05-26 NOTE — Progress Notes (Signed)
Reason for visit: Parkinson's disease, confusion  Christina Stevenson is an 72 y.o. female  History of present illness:  Christina Stevenson is a 72 year old right-handed white female with a history of Parkinson's disease.  The patient did have an increase in her medication dosing when seen in July 2019, but she did quite well until recently.  The patient was seen in the emergency room on 07 May 2018 with a significant urinary tract infection, she was running a fever of 103.  The patient was treated with antibiotics for an E. coli infection.  The patient was not really confused around that time.  The patient has had some worsening of her balance and level of functioning for the last 6 to 7 weeks, possibly related to the urinary tract infection.  The patient has not fully recovered with treatment of the bladder infection.  The patient has had several falls, she has hit her head on at least 2 occasions, she has not sought medical attention for the these injuries.  Within the last 4 days, the patient has become confused.  She is having visual hallucinations, agitation, she is more confused in the evenings, but she may have some confusion in the morning as well.  She has been getting low-dose clonazepam which seems to help calm her down.  The patient is not eating and drinking as well as normal.  She does report problems with headache that has come on in the last several weeks.  The patient comes to the office today on an urgent basis for an evaluation.  Past Medical History:  Diagnosis Date  . Allergic rhinitis, cause unspecified   . Anxiety   . Bundle branch block, unspecified   . Cervical spondylosis without myelopathy 08/08/2015  . Depression 08/2009   multiple stressors: including son who has suffered 2 CVA's and is in a rehab facility, financial difficulties, loss of pets, etc.  . Depression   . Dermatitis   . GERD (gastroesophageal reflux disease)    on bid omeprazole  . HERPES ZOSTER  09/11/2009   Qualifier: Diagnosis of  By: Coralee Pesa MD, Levada Schilling   . History of shingles   . Hyperlipemia   . Hypertension   . Irritable bowel syndrome   . Memory disturbance   . Nocturnal leg cramps 11/19/2016  . 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 Obesity   . Osteoarthrosis, unspecified whether generalized or localized, unspecified site   . Parkinson disease (HCC) 06/02/12   Probable; being follwed by Dr. Anne Hahn of [facility], has been started on Requip  . REM sleep behavior disorder   . Sciatica of right side   . Spinal stenosis in cervical region   . Uterovaginal prolapse, incomplete    with cystocele    Past Surgical History:  Procedure Laterality Date  . ABDOMINAL HYSTERECTOMY    . bladder resuspension procedure    . CERVICAL LAMINECTOMY    . LUMBAR LAMINECTOMY    . Trigger finger surgery Right    Thumb    Family History  Problem Relation Age of Onset  . Heart Problems Father        lived to be 49 yo  . Cancer - Lung Sister     Social history:  reports that she has never smoked. She has never used smokeless tobacco. She reports that she does not drink alcohol or use drugs.    Allergies  Allergen Reactions  . Penicillins Hives    Has patient had a PCN reaction causing  immediate rash, facial/tongue/throat swelling, SOB or lightheadedness with hypotension: Yes Has patient had a PCN reaction causing severe rash involving mucus membranes or skin necrosis: No Has patient had a PCN reaction that required hospitalization: No Has patient had a PCN reaction occurring within the last 10 years: No If all of the above answers are "NO", then may proceed with Cephalosporin use. *per patient has tolerated keflex before   . Prednisone     Swelling, emotionally volatile   . Latex Rash    Medications:  Prior to Admission medications   Medication Sig Start Date End Date Taking? Authorizing Provider  atorvastatin (LIPITOR) 20 MG tablet Take 1 tablet (20 mg total) by mouth daily. 11/26/17   Yes Anne Shutter, MD  carbidopa-levodopa (SINEMET IR) 25-100 MG tablet Take 1.5 tablets by mouth 3 (three) times daily. 03/10/18  Yes York Spaniel, MD  cefpodoxime (VANTIN) 200 MG tablet Take 1 tablet (200 mg total) by mouth 2 (two) times daily. 05/07/18  Yes Mancel Bale, MD  clobetasol ointment (TEMOVATE) 0.05 % APPLY TO AFFECTED AREA(S)  TOPICALLY TWO TIMES DAILY 03/03/18  Yes Tyson Alias, MD  clonazePAM (KLONOPIN) 0.5 MG tablet TAKE 3 TABLETS BY MOUTH AT  BEDTIME Patient taking differently: Take 1 mg by mouth at bedtime.  01/18/18  Yes York Spaniel, MD  cyclobenzaprine (FLEXERIL) 10 MG tablet TAKE 1 TABLET BY MOUTH THREE TIMES DAILY AS NEEDED 07/15/17  Yes York Spaniel, MD  diphenhydrAMINE (BENADRYL) 25 mg capsule Take 25 mg by mouth every 6 (six) hours as needed.   Yes [provider]  ibuprofen (ADVIL,MOTRIN) 200 MG tablet Take 200 mg by mouth every 6 (six) hours as needed for mild pain.    Yes [provider]  quinapril (ACCUPRIL) 20 MG tablet TAKE 1 TABLET BY MOUTH  DAILY 03/03/18  Yes Tyson Alias, MD  rOPINIRole (REQUIP) 3 MG tablet Take 1 tablet (3 mg total) by mouth 3 (three) times daily. 03/10/18  Yes York Spaniel, MD  sertraline (ZOLOFT) 25 MG tablet Take 1 tablet (25 mg total) by mouth daily for 120 doses. 04/11/18 08/09/18 Yes Seawell, Jaimie A, DO  pantoprazole (PROTONIX) 20 MG tablet Take 1 tablet (20 mg total) by mouth 2 (two) times daily before a meal. 11/25/17   Doneen Poisson, MD  polyethylene glycol (MIRALAX / GLYCOLAX) packet MIX CONTENTS OF 1 PACKET  (17GM) IN WATER AND DRINK  BY MOUTH DAILY Patient not taking: Reported on 05/07/2018 11/25/17   Levert Feinstein, MD    ROS:  Out of a complete 14 system review of symptoms, the patient complains only of the following symptoms, and all other reviewed systems are negative.  Decreased activity Headache, tremors Hallucinations  Blood pressure 129/67, pulse (!)  107, resp. rate 20, height 5\' 1"  (1.549 m), weight 164 lb (74.4 kg).  Physical Exam  General: The patient is alert and cooperative at the time of the examination.  The patient is moderately obese.  Respiratory: Lung fields are clear.  Cardiovascular: Regular rate and rhythm, no murmurs or rubs are noted.  Neck: Neck is supple, no carotid bruits are noted.  Skin: No significant peripheral edema is noted.   Neurologic Exam  Mental status: The patient is alert and oriented x 3 at the time of the examination. The Mini-Mental status examination done today shows a total score of 21/30.  The patient is able to name 10 animals in 60 seconds.   Cranial nerves: Facial symmetry  is present. Speech is normal, no aphasia or dysarthria is noted. Extraocular movements are full. Visual fields are full.  Mild masking of the face is seen.  Motor: The patient has good strength in all 4 extremities.  Sensory examination: Soft touch sensation is symmetric on the face, arms, and legs.  Coordination: The patient has good finger-nose-finger and heel-to-shin bilaterally.  Gait and station: The patient has a slightly unsteady gait, she has decreased arm swing bilaterally.  Tandem gait was not attempted.  Romberg is negative.  Reflexes: Deep tendon reflexes are symmetric.   Assessment/Plan:  1.  Parkinson's disease  2.  Recent onset of confusion  3.  Gait instability, falls  The patient has had a change in her functional level around the time of the urinary tract infection.  The patient has had a recent onset of confusion over the last 4 days associated with agitation, delusional thinking, and hallucinations.  The patient will be set up for blood work today and urinalysis, the patient will be seeing her primary care physician in the near future.  The patient will be set up for a CT scan of the brain as she has fallen with injury to the head.  She is now having headaches, a subdural hematoma needs to be  excluded.  The patient will follow-up in 3 months.  Marlan Palau MD 05/26/2018 12:34 PM  [facility] [address] [place], [place] [address]  Phone [phone] Fax [phone]

## 2018-05-27 ENCOUNTER — Other Ambulatory Visit: Payer: Self-pay

## 2018-05-27 ENCOUNTER — Encounter: Payer: Self-pay | Admitting: Internal Medicine

## 2018-05-27 ENCOUNTER — Ambulatory Visit (INDEPENDENT_AMBULATORY_CARE_PROVIDER_SITE_OTHER): Payer: Medicare Other | Admitting: Internal Medicine

## 2018-05-27 VITALS — BP 127/53 | HR 91 | Temp 98.6°F | Ht 60.0 in | Wt 163.5 lb

## 2018-05-27 DIAGNOSIS — Z87440 Personal history of urinary (tract) infections: Secondary | ICD-10-CM

## 2018-05-27 DIAGNOSIS — Z79899 Other long term (current) drug therapy: Secondary | ICD-10-CM

## 2018-05-27 DIAGNOSIS — N3000 Acute cystitis without hematuria: Secondary | ICD-10-CM | POA: Diagnosis not present

## 2018-05-27 DIAGNOSIS — G4700 Insomnia, unspecified: Secondary | ICD-10-CM

## 2018-05-27 DIAGNOSIS — Z23 Encounter for immunization: Secondary | ICD-10-CM | POA: Diagnosis not present

## 2018-05-27 DIAGNOSIS — R443 Hallucinations, unspecified: Secondary | ICD-10-CM | POA: Diagnosis not present

## 2018-05-27 DIAGNOSIS — I10 Essential (primary) hypertension: Secondary | ICD-10-CM

## 2018-05-27 DIAGNOSIS — G20 Parkinson's disease: Secondary | ICD-10-CM | POA: Diagnosis not present

## 2018-05-27 DIAGNOSIS — F339 Major depressive disorder, recurrent, unspecified: Secondary | ICD-10-CM

## 2018-05-27 DIAGNOSIS — Z880 Allergy status to penicillin: Secondary | ICD-10-CM

## 2018-05-27 DIAGNOSIS — F331 Major depressive disorder, recurrent, moderate: Secondary | ICD-10-CM

## 2018-05-27 LAB — VITAMIN B12: Vitamin B-12: 403 pg/mL (ref 232–1245)

## 2018-05-27 LAB — CBC WITH DIFFERENTIAL/PLATELET
BASOS ABS: 0 10*3/uL (ref 0.0–0.2)
Basos: 0 %
EOS (ABSOLUTE): 0.1 10*3/uL (ref 0.0–0.4)
Eos: 1 %
Hematocrit: 36.2 % (ref 34.0–46.6)
Hemoglobin: 11.5 g/dL (ref 11.1–15.9)
IMMATURE GRANULOCYTES: 0 %
Immature Grans (Abs): 0 10*3/uL (ref 0.0–0.1)
Lymphocytes Absolute: 0.9 10*3/uL (ref 0.7–3.1)
Lymphs: 12 %
MCH: 28.8 pg (ref 26.6–33.0)
MCHC: 31.8 g/dL (ref 31.5–35.7)
MCV: 91 fL (ref 79–97)
Monocytes Absolute: 0.7 10*3/uL (ref 0.1–0.9)
Monocytes: 9 %
Neutrophils Absolute: 5.7 10*3/uL (ref 1.4–7.0)
Neutrophils: 78 %
PLATELETS: 226 10*3/uL (ref 150–450)
RBC: 4 x10E6/uL (ref 3.77–5.28)
RDW: 13.1 % (ref 12.3–15.4)
WBC: 7.4 10*3/uL (ref 3.4–10.8)

## 2018-05-27 LAB — COMPREHENSIVE METABOLIC PANEL
ALT: 11 IU/L (ref 0–32)
AST: 16 IU/L (ref 0–40)
Albumin/Globulin Ratio: 1.2 (ref 1.2–2.2)
Albumin: 4 g/dL (ref 3.5–4.8)
Alkaline Phosphatase: 83 IU/L (ref 39–117)
BUN/Creatinine Ratio: 23 (ref 12–28)
BUN: 19 mg/dL (ref 8–27)
Bilirubin Total: 0.9 mg/dL (ref 0.0–1.2)
CALCIUM: 9.5 mg/dL (ref 8.7–10.3)
CO2: 24 mmol/L (ref 20–29)
Chloride: 101 mmol/L (ref 96–106)
Creatinine, Ser: 0.84 mg/dL (ref 0.57–1.00)
GFR calc non Af Amer: 70 mL/min/{1.73_m2} (ref >59)
GFR, EST AFRICAN AMERICAN: 81 mL/min/{1.73_m2} (ref >59)
Globulin, Total: 3.3 g/dL (ref 1.5–4.5)
Glucose: 101 mg/dL — ABNORMAL HIGH (ref 65–99)
Potassium: 4.5 mmol/L (ref 3.5–5.2)
Sodium: 141 mmol/L (ref 134–144)
TOTAL PROTEIN: 7.3 g/dL (ref 6.0–8.5)

## 2018-05-27 LAB — URINALYSIS, ROUTINE W REFLEX MICROSCOPIC

## 2018-05-27 LAB — AMMONIA: Ammonia: 33 ug/dL (ref 19–87)

## 2018-05-27 LAB — SEDIMENTATION RATE: Sed Rate: 17 mm/hr (ref 0–40)

## 2018-05-27 MED ORDER — CLONAZEPAM 0.5 MG PO TABS: 1.0000 mg | ORAL_TABLET | Freq: Every day | ORAL | 1 refills | Status: DC

## 2018-05-27 MED ORDER — SERTRALINE HCL 25 MG PO TABS: 25.0000 mg | ORAL_TABLET | Freq: Every day | ORAL | 3 refills | Status: DC

## 2018-05-27 NOTE — Patient Instructions (Addendum)
FOLLOW-UP INSTRUCTIONS When: if symptoms worsen or fail to improve. Please also stop and schedule with Dr. Cleaster Corin in 1-2 months For: Routine What to bring: All of your medications  I have changed the Sertraline to a 90 day supply your medications.  I will also strongly recommend that you stop taking the benadryl, flexeril and take only two of the Klonopin tablets nightly instead of three. We will need to consider an alternative to the Klonopin for sleep as this can cause delirium.  As always if your symptoms worsen, fail to improve, or your develop other concerning symptoms, please notify us right away.   Thank you for your visit to the [facility] today.

## 2018-05-27 NOTE — Assessment & Plan Note (Signed)
UTI symptoms: Will obtain a UA today. High threshold to retreat given that she was treated with a third gen cephalosporin for 7 days and is denying true UTI related symptoms. Patient has a penicillin allergy.

## 2018-05-27 NOTE — Assessment & Plan Note (Signed)
See Parkinsons disease from same Date

## 2018-05-27 NOTE — Progress Notes (Signed)
CC: Routine for HTN, depression and parkinsons disease  HPI:[name] is a 72 y.o. female  who presents for evaluation of her chronic HTN, depression and parkinson's disease. Please see individual problem based A/P for details.  Depression: Greatly improved on her Sertraline 25mg  daily. Patient unwilling to complete the PHQ-9 but stated that she is feeling "good". Husband stated that the crying spells and outright depressive mood have resolved. Would continue low dose Sertraline for now and consider cessation if she remains symptom free at her next visit.   Plan: Refilled Sertraline for a 90 day supply  Parkinson's: Hallucinations have increased since September 20th. She originally visited the ED where she was diagnosed with and treated for a UTI due to dysuria and a UA demonstrating large leuks and many bacteria. She was treated for 7 days per family request with cefpodoxime 200mg  BID. The majority of her symptoms resolved with regard to the dysuria, but in regard to the hallucinations there was no significant difference. As such, they visited her Neurologist who felt that given the abrupt nature of the symptoms alternative etiology from parkinson's should be considered and ordered a UA and head CT. The UA was disposed of by the patient mistakenly. She is requesting it be repeated today given her intermittent dysuria. They are hoping to rule out other causes of her symptom change.  Husband is concerned that this may be due to the patients Carbidopa-levodopa increase but will discuss this with her Neurologist. I have advised them to cease use of the Benedryl and Felxeril. In addition, I have recommended that they taper off the Klonopin as they are using this at the 1-2 tablet dose range nightly. Prior to this being prescribed however, the patient was experiencing severe nightly symptoms with night terrors, staying up at night, extreme confusion. They are insistent that it is necessary but  understand that our recommendation is to taper off of this medication completely. For now they have agreed to use not more than two tablets nightly and to attempt use of one tablet as well. My concern is that with her multiple centrally acting medications she may be experiencing medication side effects.   Plan: Currently on 1.5 tablets of Carbidopa-levodopa 25-100 mg TID UA ordered and collected, will notify patient of results when available.  Remind the patient and Husband to consider cessation of the Klonopin and advise on a tapering regimen if they are agreeable. I have discontinue this medication from our system.   UTI symptoms: Will obtain a UA today. High threshold to retreat given that she was treated with a third gen cephalosporin for 7 days and is denying true UTI related symptoms. Patient has a penicillin allergy.    Past Medical History:  Diagnosis Date  . Allergic rhinitis, cause unspecified   . Anxiety   . Bundle branch block, unspecified   . Cervical spondylosis without myelopathy 08/08/2015  . Depression 08/2009   multiple stressors: including son who has suffered 2 CVA's and is in a rehab facility, financial difficulties, loss of pets, etc.  . Depression   . Dermatitis   . GERD (gastroesophageal reflux disease)    on bid omeprazole  . HERPES ZOSTER 09/11/2009   Qualifier: Diagnosis of  By: Coralee Pesa MD, Levada Schilling   . History of shingles   . Hyperlipemia   . Hypertension   . Irritable bowel syndrome   . Memory disturbance   . Nocturnal leg cramps 11/19/2016  . 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 Obesity   .  Osteoarthrosis, unspecified whether generalized or localized, unspecified site   . Parkinson disease (HCC) 06/02/12   Probable; being follwed by Dr. Anne Hahn of [facility], has been started on Requip  . REM sleep behavior disorder   . Sciatica of right side   . Spinal stenosis in cervical region   . Uterovaginal prolapse, incomplete    with cystocele   Review of Systems:  ROS  negative except as per HPI.  Physical Exam: Vitals:   05/27/18 0913  BP: (!) 127/53  Pulse: 91  Temp: 98.6 F (37 C)  TempSrc: Oral  SpO2: 100%  Weight: 163 lb 8 oz (74.2 kg)  Height: 5' (1.524 m)   General: A/O x4, in no acute distress, afebrile, nondiaphoretic HEENT: PERRL, EOM intact Cardio: RRR, no mrg's Pulmonary: CTA bilaterally   Assessment & Plan:   See Encounters Tab for problem based charting.  Patient discussed with Dr. Oswaldo Done

## 2018-05-27 NOTE — Assessment & Plan Note (Signed)
Parkinson's: Hallucinations have increased since September 20th. She originally visited the ED where she was diagnosed with and treated for a UTI due to dysuria and a UA demonstrating large leuks and many bacteria. She was treated for 7 days per family request with cefpodoxime 200mg  BID. The majority of her symptoms resolved with regard to the dysuria, but in regard to the hallucinations there was no significant difference. As such, they visited her Neurologist who felt that given the abrupt nature of the symptoms alternative etiology from parkinson's should be considered and ordered a UA and head CT. The UA was disposed of by the patient mistakenly. She is requesting it be repeated today given her intermittent dysuria. They are hoping to rule out other causes of her symptom change.  Husband is concerned that this may be due to the patients Carbidopa-levodopa increase but will discuss this with her Neurologist. I have advised them to cease use of the Benedryl and Felxeril. In addition, I have recommended that they taper off the Klonopin as they are using this at the 1-2 tablet dose range nightly. Prior to this being prescribed however, the patient was experiencing severe nightly symptoms with night terrors, staying up at night, extreme confusion. They are insistent that it is necessary but understand that our recommendation is to taper off of this medication completely. For now they have agreed to use not more than two tablets nightly and to attempt use of one tablet as well. My concern is that with her multiple centrally acting medications she may be experiencing medication side effects.   Plan: Currently on 1.5 tablets of Carbidopa-levodopa 25-100 mg TID UA ordered and collected, will notify patient of results when available.  Remind the patient and Husband to consider cessation of the Klonopin and advise on a tapering regimen if they are agreeable. I have discontinue this medication from our system.

## 2018-05-27 NOTE — Assessment & Plan Note (Signed)
-   Received influenza vaccine today 

## 2018-05-27 NOTE — Assessment & Plan Note (Signed)
Depression: Greatly improved on her Sertraline 25mg  daily. Patient unwilling to complete the PHQ-9 but stated that she is feeling "good". Husband stated that the crying spells and outright depressive mood have resolved. Would continue low dose Sertraline for now and consider cessation if she remains symptom free at her next visit.   Plan: Refilled Sertraline for a 90 day supply

## 2018-05-27 NOTE — Assessment & Plan Note (Signed)
See Parkinsons disease A/P from same date

## 2018-05-28 ENCOUNTER — Telehealth: Payer: Self-pay | Admitting: Neurology

## 2018-05-28 ENCOUNTER — Ambulatory Visit
Admission: RE | Admit: 2018-05-28 | Discharge: 2018-05-28 | Disposition: A | Discharge status: Home / Self Care | Payer: Medicare Other | Source: Ambulatory Visit | Attending: Neurology | Admitting: Neurology

## 2018-05-28 DIAGNOSIS — R410 Disorientation, unspecified: Secondary | ICD-10-CM | POA: Diagnosis not present

## 2018-05-28 LAB — MICROSCOPIC EXAMINATION
Bacteria, UA: NONE SEEN
CASTS: NONE SEEN /LPF

## 2018-05-28 LAB — URINALYSIS, ROUTINE W REFLEX MICROSCOPIC
Bilirubin, UA: NEGATIVE
Glucose, UA: NEGATIVE
Ketones, UA: NEGATIVE
NITRITE UA: NEGATIVE
PH UA: 5.5 (ref 5.0–7.5)
Protein, UA: NEGATIVE
RBC, UA: NEGATIVE
Specific Gravity, UA: 1.016 (ref 1.005–1.030)
UUROB: 0.2 mg/dL (ref 0.2–1.0)

## 2018-05-28 MED ORDER — ROPINIROLE HCL 2 MG PO TABS: 2.0000 mg | ORAL_TABLET | Freq: Three times a day (TID) | ORAL | 3 refills | Status: DC

## 2018-05-28 NOTE — Telephone Encounter (Signed)
I called patient, talk with her husband.  Blood work and urinalysis was okay we will reduce the Requip dose in hopes of helping the confusion.  The patient will drop back to 2 mg 3 times a day of the Requip.  The Sinemet dose will remain the same.   CT head 05/28/18:  IMPRESSION: Unremarkable CT scan of the head without contrast showing mild changes of age-appropriate chronic microvascular ischemia and cortical atrophy.  No acute abnormalities are noted.

## 2018-05-28 NOTE — Progress Notes (Signed)
Internal Medicine Clinic Attending  Case discussed with Dr. Harbrecht at the time of the visit.  We reviewed the resident's history and exam and pertinent patient test results.  I agree with the assessment, diagnosis, and plan of care documented in the resident's note.   

## 2018-05-31 ENCOUNTER — Ambulatory Visit: Payer: Self-pay

## 2018-06-01 ENCOUNTER — Other Ambulatory Visit: Payer: Self-pay | Admitting: *Deleted

## 2018-06-07 ENCOUNTER — Other Ambulatory Visit: Payer: Self-pay | Admitting: Neurology

## 2018-06-07 MED ORDER — CARBIDOPA-LEVODOPA 25-100 MG PO TABS: 1.5000 | ORAL_TABLET | Freq: Three times a day (TID) | ORAL | 2 refills | Status: DC

## 2018-06-07 NOTE — Addendum Note (Signed)
Addended by: Candis Schatz I on: 06/07/2018 02:17 PM   Modules accepted: Orders

## 2018-06-07 NOTE — Telephone Encounter (Signed)
Sinemet escribed to [facility] as requested/fim 

## 2018-06-07 NOTE — Telephone Encounter (Signed)
Pt husband(on DPR-[place],George V dated 04-14-2014) has called to inform that the prescription for carbidopa-levodopa (SINEMET IR) 25-100 MG tablet was sent to  [id number] - [place], [place] - [address] AT [facility] [address] [phone] (Phone) [phone] (Fax)   Pt husband is asking to be sent to  [facility] [place], [place] - [address] [phone] (Phone) [phone] (Fax)

## 2018-08-24 ENCOUNTER — Ambulatory Visit: Payer: Medicare Other | Admitting: Neurology

## 2018-08-24 ENCOUNTER — Encounter: Payer: Self-pay | Admitting: Neurology

## 2018-08-24 VITALS — BP 136/74 | HR 77 | Ht 60.0 in | Wt 167.0 lb

## 2018-08-24 DIAGNOSIS — G20 Parkinson's disease: Secondary | ICD-10-CM

## 2018-08-24 DIAGNOSIS — R443 Hallucinations, unspecified: Secondary | ICD-10-CM | POA: Diagnosis not present

## 2018-08-24 DIAGNOSIS — R413 Other amnesia: Secondary | ICD-10-CM

## 2018-08-24 DIAGNOSIS — R269 Unspecified abnormalities of gait and mobility: Secondary | ICD-10-CM

## 2018-08-24 MED ORDER — ROPINIROLE HCL 2 MG PO TABS: 2.0000 mg | ORAL_TABLET | Freq: Three times a day (TID) | ORAL | 2 refills | Status: DC

## 2018-08-24 NOTE — Progress Notes (Signed)
Reason for visit: Parkinson's disease  Christina Stevenson is an 73 y.o. female  History of present illness:  Christina Stevenson is a 73 year old right-handed white female with a history of Parkinson's disease associated with a memory disorder and a gait disorder.  Christina Stevenson has recent been seen for increased confusion, she is having hallucinations that are daily in nature, she is also having delusional thinking at times.  She is seeing mainly family members inside Christina house, she may see someone camping out under a tree and she has an obsessive thought that Christina tree is going to fall on Christina house.  Christina Stevenson was reduced on Christina Requip to 2 mg 3 times daily, but she never actually did this, she is still taking 3 mg 3 times daily.  Christina Stevenson has done well with her balance but she did have a fall last week when she tried to put on a pair of slacks while standing up.  Christina Stevenson bruised Christina right side of Christina body with a fall, she did not hit her head.  A recent CT scan of Christina brain was done and was unremarkable.  Christina Stevenson does have a underlying memory disorder.  Her confusion seems to worsen later in Christina evening.  She does not operate a motor vehicle currently.  She is not having problems with swallowing or freezing with walking.  She returns for an evaluation.  Past Medical History:  Diagnosis Date  . Allergic rhinitis, cause unspecified   . Anxiety   . BACK PAIN 07/09/2010   Qualifier: Diagnosis of  By: Coralee PesaWoodyear MD, Levada SchillingWynne E   . Bundle branch block, unspecified   . Cervical spondylosis without myelopathy 08/08/2015  . Depression 08/2009   multiple stressors: including son who has suffered 2 CVA's and is in a rehab facility, financial difficulties, loss of pets, etc.  . Depression   . Dermatitis   . GERD (gastroesophageal reflux disease)    on bid omeprazole  . HERPES ZOSTER 09/11/2009   Qualifier: Diagnosis of  By: Coralee PesaWoodyear MD, Levada SchillingWynne E   . History of shingles   . Hyperlipemia   .  Hypertension   . Irritable bowel syndrome   . Memory disturbance   . Nocturnal leg cramps 11/19/2016  . 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 Obesity   . Osteoarthrosis, unspecified whether generalized or localized, unspecified site   . Parkinson disease (HCC) 06/02/12   Probable; being follwed by Dr. Anne HahnWillis of [facility], has been started on Requip  . REM sleep behavior disorder   . Sciatica of right side   . Spinal stenosis in cervical region   . Uterovaginal prolapse, incomplete    with cystocele    Past Surgical History:  Procedure Laterality Date  . ABDOMINAL HYSTERECTOMY    . bladder resuspension procedure    . CERVICAL LAMINECTOMY    . LUMBAR LAMINECTOMY    . Trigger finger surgery Right    Thumb    Family History  Problem Relation Age of Onset  . Heart Problems Father        lived to be 72 yo  . Cancer - Lung Sister     Social history:  reports that she has never smoked. She has never used smokeless tobacco. She reports that she does not drink alcohol or use drugs.    Allergies  Allergen Reactions  . Penicillins Hives    Has Stevenson had a PCN reaction causing immediate rash, facial/tongue/throat swelling, SOB or lightheadedness with  hypotension: Yes Has Stevenson had a PCN reaction causing severe rash involving mucus membranes or skin necrosis: No Has Stevenson had a PCN reaction that required hospitalization: No Has Stevenson had a PCN reaction occurring within Christina last 10 years: No If all of Christina above answers are "NO", then may proceed with Cephalosporin use. *per Stevenson has tolerated keflex before   . Prednisone     Swelling, emotionally volatile   . Latex Rash    Medications:  Prior to Admission medications   Medication Sig Start Date End Date Taking? Authorizing Provider  atorvastatin (LIPITOR) 20 MG tablet Take 1 tablet (20 mg total) by mouth daily. 11/26/17  Yes Anne Shutter, MD  carbidopa-levodopa (SINEMET IR) 25-100 MG tablet Take 1.5 tablets by mouth 3  (three) times daily. Stevenson taking differently: Take 1.5 tablets by mouth 3 (three) times daily. Pt is taking 1 whole tab tid and 0.5 tab bid 06/07/18  Yes York Spaniel, MD  cefpodoxime (VANTIN) 200 MG tablet Take 1 tablet (200 mg total) by mouth 2 (two) times daily. 05/07/18  Yes Mancel Bale, MD  clobetasol ointment (TEMOVATE) 0.05 % APPLY TO AFFECTED AREA(S)  TOPICALLY TWO TIMES DAILY 03/03/18  Yes Tyson Alias, MD  ibuprofen (ADVIL,MOTRIN) 200 MG tablet Take 200 mg by mouth every 6 (six) hours as needed for mild pain.    Yes [provider]  pantoprazole (PROTONIX) 20 MG tablet Take 1 tablet (20 mg total) by mouth 2 (two) times daily before a meal. 11/25/17  Yes Doneen Poisson, MD  polyethylene glycol (MIRALAX / GLYCOLAX) packet MIX CONTENTS OF 1 PACKET  (17GM) IN WATER AND DRINK  BY MOUTH DAILY 11/25/17  Yes Levert Feinstein, MD  quinapril (ACCUPRIL) 20 MG tablet TAKE 1 TABLET BY MOUTH  DAILY 03/03/18  Yes Tyson Alias, MD  rOPINIRole (REQUIP) 2 MG tablet Take 1 tablet (2 mg total) by mouth 3 (three) times daily. Stevenson taking differently: Take 3 mg by mouth 3 (three) times daily.  05/28/18  Yes York Spaniel, MD  sertraline (ZOLOFT) 25 MG tablet Take 1 tablet (25 mg total) by mouth daily for 120 doses. 05/27/18 09/24/18 Yes Lanelle Bal, MD    ROS:  Out of a complete 14 system review of symptoms, Christina Stevenson complains only of Christina following symptoms, and all other reviewed systems are negative.  Decreased activity, decreased appetite Eye redness Choking Diarrhea Snoring Environmental allergies Neck pain Skin rash  Blood pressure 136/74, pulse 77, height 5' (1.524 m), weight 167 lb (75.8 kg), SpO2 97 %.  Physical Exam  General: Christina Stevenson is alert and cooperative at Christina time of Christina examination.  Skin: No significant peripheral edema is noted.   Neurologic Exam  Mental status: Christina Stevenson is alert and oriented x 3 at Christina time of  Christina examination. Christina Stevenson has apparent normal recent and remote memory, with an apparently normal attention span and concentration ability.  Christina Mini-Mental status examination done today shows a total score 28/30.   Cranial nerves: Facial symmetry is present. Speech is normal, no aphasia or dysarthria is noted. Extraocular movements are full. Visual fields are full.  Mild masking of Christina face is seen.  Motor: Christina Stevenson has good strength in all 4 extremities.  Sensory examination: Soft touch sensation is symmetric on Christina face, arms, and legs.  Coordination: Christina Stevenson has good finger-nose-finger and heel-to-shin bilaterally.  Gait and station: Christina Stevenson has a slightly stooped posture, Christina Stevenson shuffle slightly  with walking, she is able to walk independently but usually uses a cane for ambulation.  Tandem gait was not attempted.  Romberg is negative.  Reflexes: Deep tendon reflexes are symmetric.   CT head 05/28/18:  IMPRESSION: Unremarkable CT scan of Christina head without contrast showing mild changes of age-appropriate chronic microvascular ischemia and cortical atrophy. No acute abnormalities are noted.  * CT scan images were reviewed online. I agree with Christina written report.    Assessment/Plan:  1.  Parkinson's disease  2.  Mild memory disorder  3.  Hallucinations  4.  Gait disorder, falls  Christina Stevenson will be followed for Christina memory issue.  She will be reduced on Christina Requip taking 2 mg 3 times daily.  If Christina mental status has not improved, we may consider adding Seroquel for Christina hallucinations.  Christina Stevenson is to remain active.  We will get her set up for physical therapy working on gait training and safety issues.  Christina most recent fall was simply because Christina Stevenson was doing things that were unsafe, Christina fall was completely preventable.  She will follow-up in 4 months.  Marlan Palau. Keith Shareeka Yim MD 08/24/2018 9:55 AM  [facility] [address] [place], [place] [id number]  Phone [phone] Fax [phone]

## 2018-08-24 NOTE — Patient Instructions (Signed)
Reduce the Requip to 2 mg three times a day.

## 2018-09-06 ENCOUNTER — Ambulatory Visit: Payer: Medicare Other | Admitting: Neurology

## 2018-09-20 ENCOUNTER — Ambulatory Visit: Payer: Medicare Other | Attending: Neurology | Admitting: Physical Therapy

## 2018-09-20 ENCOUNTER — Encounter: Payer: Self-pay | Admitting: Physical Therapy

## 2018-09-20 DIAGNOSIS — R2689 Other abnormalities of gait and mobility: Secondary | ICD-10-CM | POA: Insufficient documentation

## 2018-09-20 DIAGNOSIS — R2681 Unsteadiness on feet: Secondary | ICD-10-CM | POA: Insufficient documentation

## 2018-09-20 DIAGNOSIS — R293 Abnormal posture: Secondary | ICD-10-CM | POA: Diagnosis not present

## 2018-09-20 DIAGNOSIS — R29818 Other symptoms and signs involving the nervous system: Secondary | ICD-10-CM | POA: Diagnosis not present

## 2018-09-21 NOTE — Therapy (Signed)
[facility] [address] [place], [place], [address] Phone: [phone]   Fax:  [phone]  Physical Therapy Evaluation  Patient Details  Name: Christina Stevenson MRN: [id number] Date of Birth: 1946/07/18 Referring Provider (PT): Lesly Dukes   Encounter Date: 09/20/2018  PT End of Session - 09/21/18 1212    Visit Number  1    Number of Visits  17    Date for PT Re-Evaluation  11/20/18    Authorization Type  [employer] Medicare    PT Start Time  1317    PT Stop Time  1401    PT Time Calculation (min)  44 min    Equipment Utilized During Treatment  Gait belt    Activity Tolerance  Patient tolerated treatment well    Behavior During Therapy  [facility] for tasks assessed/performed;Impulsive;Flat affect       Past Medical History:  Diagnosis Date  . Allergic rhinitis, cause unspecified   . Anxiety   . BACK PAIN 07/09/2010   Qualifier: Diagnosis of  By: Coralee Pesa MD, Levada Schilling   . Bundle branch block, unspecified   . Cervical spondylosis without myelopathy 08/08/2015  . Depression 08/2009   multiple stressors: including son who has suffered 2 CVA's and is in a rehab facility, financial difficulties, loss of pets, etc.  . Depression   . Dermatitis   . GERD (gastroesophageal reflux disease)    on bid omeprazole  . HERPES ZOSTER 09/11/2009   Qualifier: Diagnosis of  By: Coralee Pesa MD, Levada Schilling   . History of shingles   . Hyperlipemia   . Hypertension   . Irritable bowel syndrome   . Memory disturbance   . Nocturnal leg cramps 11/19/2016  . 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 Obesity   . Osteoarthrosis, unspecified whether generalized or localized, unspecified site   . Parkinson disease (HCC) 06/02/12   Probable; being follwed by Dr. Anne Hahn of [facility], has been started on Requip  . REM sleep behavior disorder   . Sciatica of right side   . Spinal stenosis in cervical region   . Uterovaginal prolapse, incomplete    with cystocele     Past Surgical History:  Procedure Laterality Date  . ABDOMINAL HYSTERECTOMY    . bladder resuspension procedure    . CERVICAL LAMINECTOMY    . LUMBAR LAMINECTOMY    . Trigger finger surgery Right    Thumb    There were no vitals filed for this visit.   Subjective Assessment - 09/20/18 1324    Subjective  I fall alot.  I want help with my balance and my memory.  I tend to fall when I bend over to pick up things.  I have two canes and sometimes use one cane.  Has had 4-5 falls in the past 6 months.  Needs help getting up from the floor.    Patient is accompained by:  Family member   Husband, Greggory Stallion   Patient Stated Goals  Pt's goal for physical therapy is to help with tremors, get more walking.    Currently in Pain?  No/denies         [facility] PT Assessment - 09/20/18 1330      Assessment   Medical Diagnosis  Parkinson's disease    Referring Provider (PT)  Lesly Dukes    Onset Date/Surgical Date  08/24/18   MD visit     Precautions   Precautions  Fall    Precaution Comments  Pt  also experiences hallucinations, reports memory is not good      Balance Screen   Has the patient fallen in the past 6 months  Yes    How many times?  4-5    Has the patient had a decrease in activity level because of a fear of falling?   Yes    Is the patient reluctant to leave their home because of a fear of falling?   Yes      Home Environment   Living Environment  Private residence    Living Arrangements  Spouse/significant other    Available Help at Discharge  Family    Type of Home  House    Home Access  Stairs to enter    Entrance Stairs-Number of Steps  6    Entrance Stairs-Rails  Right;Left    Home Layout  One level    Home Equipment  [place] - single point      Prior Function   Level of Independence  Independent with basic ADLs;Independent with household mobility without device    Leisure  Enjoys walking outside; enjoys going out on a boat; goes out to eat       Cognition   Overall Cognitive Status  History of cognitive impairments - at baseline      Observation/Other Assessments   Focus on Therapeutic Outcomes (FOTO)   NA      ROM / Strength   AROM / PROM / Strength  Strength      Strength   Overall Strength  Deficits    Overall Strength Comments  Grossly tested 4/5 bilateral hip flexion, quads, hamstrings; 3+/5 bilateral ankle dorsiflexion      Transfers   Transfers  Sit to Stand;Stand to Sit    Sit to Stand  5: Supervision;With upper extremity assist;Without upper extremity assist;From chair/3-in-1    Five time sit to stand comments   13.13    Stand to Sit  5: Supervision;Without upper extremity assist;To chair/3-in-1    Comments  When pt is coming to sit in chair after gait, she does not fully turn around to sit, and husband reports she almost misses chairs all the time at home.      Ambulation/Gait   Ambulation/Gait  Yes    Ambulation/Gait Assistance  5: Supervision    Ambulation Distance (Feet)  200 Feet    Assistive device  None    Gait Pattern  Step-through pattern;Decreased arm swing - right;Decreased arm swing - left;Trunk flexed;Poor foot clearance - left;Poor foot clearance - right;Decreased step length - right;Decreased step length - left    Ambulation Surface  Level;Indoor    Gait velocity  12.31 sec = 2.66 ft/sec      Standardized Balance Assessment   Standardized Balance Assessment  Timed Up and Go Test;Dynamic Gait Index      Dynamic Gait Index   Level Surface  Normal    Change in Gait Speed  Mild Impairment    Gait with Horizontal Head Turns  Mild Impairment    Gait with Vertical Head Turns  Mild Impairment    Gait and Pivot Turn  Mild Impairment    Step Over Obstacle  Moderate Impairment    Step Around Obstacles  Mild Impairment    Steps  Mild Impairment    Total Score  16    DGI comment:  Scores <19/24 indicate increased fall risk.      Timed Up and Go Test   Normal TUG (seconds)  15.43  Manual TUG  (seconds)  15.71    Cognitive TUG (seconds)  20.63    TUG Comments  Scores >13.5-15 seconds indicate increased fall risk, decreased dual tasking ability with gait.      High Level Balance   High Level Balance Comments  Posterior push and release test:  Pt would fall if unaided in posterior direction                Objective measurements completed on examination: See above findings.                PT Short Term Goals - 09/21/18 1219      PT SHORT TERM GOAL #1   Title  Pt will perform HEP with husband's supervision, for improved transfers, balance, gait.  TARGET  10/22/2018    Time  4    Period  Weeks    Status  New    Target Date  10/22/18      PT SHORT TERM GOAL #2   Title  Pt will perform 5x sit<>stand test in less than or equal to 12.5 seconds for improved transfer safety and efficiency.    Time  4    Period  Weeks    Status  New    Target Date  10/22/18      PT SHORT TERM GOAL #3   Title  Pt will improve TUG score to less than or equal to 13.5 seconds for decreased fall risk.    Time  4    Period  Weeks    Status  New    Target Date  10/22/18      PT SHORT TERM GOAL #4   Title  Pt will recover balance with 2 steps or less in posterior direction with posterior push and release test, for improved balance recovery.    Time  4    Period  Weeks    Status  New    Target Date  10/22/18      PT SHORT TERM GOAL #5   Title  Pt/husband will verbalize understanding of fall prevention in home environment.    Time  4    Period  Weeks    Status  New    Target Date  10/22/18        PT Long Term Goals - 09/21/18 1223      PT LONG TERM GOAL #1   Title  Pt/husband will verbalize plans for continued community fitness upon d/c from PT.  TARGET 11/19/2018    Time  8    Period  Weeks    Status  New    Target Date  11/19/18      PT LONG TERM GOAL #2   Title  Pt will improve DGI score to at least 19/24 for decreased fall risk.    Time  8    Period  Weeks     Status  New    Target Date  11/19/18      PT LONG TERM GOAL #3   Title  Pt will improve TUG cognitive score to less than or equal to 10% difference with TUG score, for improved ability for dual tasking with gait.    Time  8    Period  Weeks    Status  New    Target Date  11/19/18      PT LONG TERM GOAL #4   Title  Pt will ambulate at least 1000 ft, indoor and outdoor surfaces, supervision, with least  restrictive assistive device, for improved safety with outdoor and long distance gait.    Time  8    Period  Weeks    Status  New    Target Date  11/19/18      PT LONG TERM GOAL #5   Title  Pt will perform floor>stand transfer with minimal assistance, for improved safety with fall recovery.    Time  8    Period  Weeks    Status  New    Target Date  11/19/18             Plan - 09/21/18 1213    Clinical Impression Statement  Pt is a 73 year old female who presents to OPPT with history of Parkinson's disease, recent fall and hallucinations.  Pt presents with decreased balance, decreased safety awareness, decreased timing and coordination of gait, postural instability.  Pt is at fall risk per DGI, TUG scores.  Pt has had at least 4-5 falls in past 6 months.  She enjoys walking in neighborhood with husband and going out to eat.  She would benefit from skilled PT to address the above stated deficits for improved functional mobility, decreased fall risk.    History and Personal Factors relevant to plan of care:  PMH >3 co-morbidities, 4-5 falls in past 6 months, memory deficits    Clinical Presentation  Evolving    Clinical Presentation due to:  PD as neurodegenerative disease, fall risk per TUG, TUG cognitive, DGI scores    Clinical Decision Making  Moderate    Rehab Potential  Good    PT Frequency  2x / week    PT Duration  8 weeks   plus eval   PT Treatment/Interventions  ADLs/Self Care Home Management;Balance training;DME Instruction;Gait training;Neuromuscular  re-education;Functional mobility training;Therapeutic activities;Patient/family education;Therapeutic exercise    PT Next Visit Plan  Initiate HEP for balance, posture, transfers, PWR! Moves as appropriate; fall prevention education    Consulted and Agree with Plan of Care  Patient;Family member/caregiver    Family Member Consulted  husband       Patient will benefit from skilled therapeutic intervention in order to improve the following deficits and impairments:  Abnormal gait, Decreased balance, Decreased safety awareness, Decreased mobility, Decreased strength, Difficulty walking, Postural dysfunction  Visit Diagnosis: Other abnormalities of gait and mobility  Unsteadiness on feet  Abnormal posture  Other symptoms and signs involving the nervous system     Problem List Patient Active Problem List   Diagnosis Date Noted  . Insomnia 05/27/2018  . Hallucinations 05/27/2018  . Need for immunization against influenza 05/27/2018  . 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 Constipation 07/01/2017  . Nocturnal leg cramps 11/19/2016  . UTI (urinary tract infection) 11/12/2016  . Cervical spondylosis without myelopathy 08/08/2015  . Pins and needles sensation 02/22/2015  . Headache 02/22/2015  . Memory loss 10/05/2012  . 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 Parkinson disease (HCC) 04/15/2012  . Dysuria 12/24/2011  . Heart palpitations 12/24/2011  . Hot flashes 12/24/2011  . Dysphagia 02/11/2011  . Preventative health care 01/14/2011  . Depression 08/18/2009  . HLD (hyperlipidemia) 09/16/2006  . Essential hypertension 09/16/2006  . Allergic rhinitis 09/16/2006  . GERD 09/16/2006    Gagandeep Pettet W. 09/21/2018, 12:27 PM  Gean Maidens., PT   [facility] [address] [place], [place], [address] Phone: [phone]   Fax:  [phone]  Name: Christina Stevenson MRN: [id number] Date of Birth: [birth year]

## 2018-10-06 ENCOUNTER — Ambulatory Visit: Payer: Medicare Other | Admitting: Physical Therapy

## 2018-10-06 ENCOUNTER — Encounter: Payer: Self-pay | Admitting: Physical Therapy

## 2018-10-06 DIAGNOSIS — R29818 Other symptoms and signs involving the nervous system: Secondary | ICD-10-CM

## 2018-10-06 DIAGNOSIS — R293 Abnormal posture: Secondary | ICD-10-CM

## 2018-10-06 DIAGNOSIS — R2689 Other abnormalities of gait and mobility: Secondary | ICD-10-CM

## 2018-10-06 DIAGNOSIS — R2681 Unsteadiness on feet: Secondary | ICD-10-CM | POA: Diagnosis not present

## 2018-10-06 NOTE — Therapy (Signed)
[facility] [address] [place], [place], [address] Phone: [phone]   Fax:  [phone]  Physical Therapy Treatment  Patient Details  Name: Christina Stevenson MRN: [id number] Date of Birth: Nov 21, 1945 Referring Provider (PT): Lesly Dukes   Encounter Date: 10/06/2018  PT End of Session - 10/06/18 1133    Visit Number  2    Number of Visits  17    Date for PT Re-Evaluation  11/20/18    Authorization Type  [employer]HC Medicare    PT Start Time  0803    PT Stop Time  0843    PT Time Calculation (min)  40 min    Equipment Utilized During Treatment  Gait belt    Activity Tolerance  Patient tolerated treatment well    Behavior During Therapy  [facility] for tasks assessed/performed;Impulsive;Flat affect       Past Medical History:  Diagnosis Date  . Allergic rhinitis, cause unspecified   . Anxiety   . BACK PAIN 07/09/2010   Qualifier: Diagnosis of  By: Christina Pesa MD, Christina Stevenson   . Bundle branch block, unspecified   . Cervical spondylosis without myelopathy 08/08/2015  . Depression 08/2009   multiple stressors: including son who has suffered 2 CVA's and is in a rehab facility, financial difficulties, loss of pets, etc.  . Depression   . Dermatitis   . GERD (gastroesophageal reflux disease)    on bid omeprazole  . HERPES ZOSTER 09/11/2009   Qualifier: Diagnosis of  By: Christina Pesa MD, Christina Stevenson   . History of shingles   . Hyperlipemia   . Hypertension   . Irritable bowel syndrome   . Memory disturbance   . Nocturnal leg cramps 11/19/2016  . 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 Obesity   . Osteoarthrosis, unspecified whether generalized or localized, unspecified site   . Parkinson disease (HCC) 06/02/12   Probable; being follwed by Dr. Anne Stevenson of [facility], has been started on Requip  . REM sleep behavior disorder   . Sciatica of right side   . Spinal stenosis in cervical region   . Uterovaginal prolapse, incomplete    with cystocele     Past Surgical History:  Procedure Laterality Date  . ABDOMINAL HYSTERECTOMY    . bladder resuspension procedure    . CERVICAL LAMINECTOMY    . LUMBAR LAMINECTOMY    . Trigger finger surgery Right    Thumb    There were no vitals filed for this visit.  Subjective Assessment - 10/06/18 0804    Subjective  Denies falls or changes.      Patient is accompained by:  --   Husband, Christina Stevenson   Patient Stated Goals  Pt's goal for physical therapy is to help with tremors, get more walking.    Currently in Pain?  No/denies   Reports having h/o back surgery and hurts in the morning when she gets out of bed.         [facility] Adult PT Treatment/Exercise - 10/06/18 0001      Transfers   Transfers  Sit to Stand;Stand to Sit    Sit to Stand  5: Supervision;With upper extremity assist;Without upper extremity assist;From chair/3-in-1    Stand to Sit  5: Supervision;Without upper extremity assist;To chair/3-in-1    Number of Reps  10 reps    Transfer Cueing  needs cues for forward weight shift and to control descent.  Provided as HEP.      Exercises   Exercises  Knee/Hip;Ankle      Knee/Hip Exercises: Aerobic   Other Aerobic  Scifit level 1.5 all 4 extremties x 8 minutes for endurance and strength and flexibility      Knee/Hip Exercises: Standing   Heel Raises  Both;1 set;10 reps;1 second    Knee Flexion  Both;1 set;10 reps    Hip Flexion  Both;1 set;10 reps;Knee bent    Hip Abduction  Both;1 set;10 reps;Knee straight    Hip Extension  Both;1 set;10 reps;Knee straight    SLS  x 10 seconds alternating LE's x 3 reps each          Balance Exercises - 10/06/18 0824      Balance Exercises: Standing   Standing Eyes Closed  Narrow base of support (BOS);Head turns;Other reps (comment)   10 reps with and without head turns;no significant LOB   Tandem Stance  Eyes open;Intermittent upper extremity support;10 secs;3 reps    Tandem Gait  Forward;Intermittent upper extremity support;3  reps;Other (comment)   8' along counter for intermittent support   Sidestepping  3 reps;Other (comment)   along counter with intermittent UE x 8' x 3 reps       PT Education - 10/06/18 1133    Education Details  HEP    Person(s) Educated  Patient    Methods  Explanation;Demonstration;Handout;Verbal cues    Comprehension  Verbalized understanding       PT Short Term Goals - 09/21/18 1219      PT SHORT TERM GOAL #1   Title  Pt will perform HEP with husband's supervision, for improved transfers, balance, gait.  TARGET  10/22/2018    Time  4    Period  Weeks    Status  New    Target Date  10/22/18      PT SHORT TERM GOAL #2   Title  Pt will perform 5x sit<>stand test in less than or equal to 12.5 seconds for improved transfer safety and efficiency.    Time  4    Period  Weeks    Status  New    Target Date  10/22/18      PT SHORT TERM GOAL #3   Title  Pt will improve TUG score to less than or equal to 13.5 seconds for decreased fall risk.    Time  4    Period  Weeks    Status  New    Target Date  10/22/18      PT SHORT TERM GOAL #4   Title  Pt will recover balance with 2 steps or less in posterior direction with posterior push and release test, for improved balance recovery.    Time  4    Period  Weeks    Status  New    Target Date  10/22/18      PT SHORT TERM GOAL #5   Title  Pt/husband will verbalize understanding of fall prevention in home environment.    Time  4    Period  Weeks    Status  New    Target Date  10/22/18        PT Long Term Goals - 09/21/18 1223      PT LONG TERM GOAL #1   Title  Pt/husband will verbalize plans for continued community fitness upon d/c from PT.  TARGET 11/19/2018    Time  8    Period  Weeks    Status  New    Target Date  11/19/18  PT LONG TERM GOAL #2   Title  Pt will improve DGI score to at least 19/24 for decreased fall risk.    Time  8    Period  Weeks    Status  New    Target Date  11/19/18      PT LONG TERM GOAL  #3   Title  Pt will improve TUG cognitive score to less than or equal to 10% difference with TUG score, for improved ability for dual tasking with gait.    Time  8    Period  Weeks    Status  New    Target Date  11/19/18      PT LONG TERM GOAL #4   Title  Pt will ambulate at least 1000 ft, indoor and outdoor surfaces, supervision, with least restrictive assistive device, for improved safety with outdoor and long distance gait.    Time  8    Period  Weeks    Status  New    Target Date  11/19/18      PT LONG TERM GOAL #5   Title  Pt will perform floor>stand transfer with minimal assistance, for improved safety with fall recovery.    Time  8    Period  Weeks    Status  New    Target Date  11/19/18            Plan - 10/06/18 0837    Clinical Impression Statement  Skilled session focused on strength, balance and endurance along with initiating HEP.  Pt needed one seated rest break during session.  Continue PT per POC.    Rehab Potential  Good    PT Frequency  2x / week    PT Duration  8 weeks   plus eval   PT Treatment/Interventions  ADLs/Self Care Home Management;Balance training;DME Instruction;Gait training;Neuromuscular re-education;Functional mobility training;Therapeutic activities;Patient/family education;Therapeutic exercise    PT Next Visit Plan  Review HEP and revise as needed.  Continue balance, posture, transfers, PWR! Moves as appropriate; fall prevention education    PT Home Exercise Plan  Access Code: [email or web address]    Consulted and Agree with Plan of Care  Patient;Family member/caregiver    Family Member Consulted  husband       Patient will benefit from skilled therapeutic intervention in order to improve the following deficits and impairments:  Abnormal gait, Decreased balance, Decreased safety awareness, Decreased mobility, Decreased strength, Difficulty walking, Postural dysfunction  Visit Diagnosis: Other abnormalities of gait and mobility  Unsteadiness on  feet  Abnormal posture  Other symptoms and signs involving the nervous system     Problem List Patient Active Problem List   Diagnosis Date Noted  . Insomnia 05/27/2018  . Hallucinations 05/27/2018  . Need for immunization against influenza 05/27/2018  . 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 Constipation 07/01/2017  . Nocturnal leg cramps 11/19/2016  . UTI (urinary tract infection) 11/12/2016  . Cervical spondylosis without myelopathy 08/08/2015  . Pins and needles sensation 02/22/2015  . Headache 02/22/2015  . Memory loss 10/05/2012  . 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 Parkinson disease (HCC) 04/15/2012  . Dysuria 12/24/2011  . Heart palpitations 12/24/2011  . Hot flashes 12/24/2011  . Dysphagia 02/11/2011  . Preventative health care 01/14/2011  . Depression 08/18/2009  . HLD (hyperlipidemia) 09/16/2006  . Essential hypertension 09/16/2006  . Allergic rhinitis 09/16/2006  . GERD 09/16/2006   Christina Stevenson, Christina Stevenson [facility] 10/06/18 11:36 AM Phone: [phone] Fax: [phone]  [facility] [address] [place], [place], [address] Phone: [phone]   Fax:  [phone]  Name: Christina Stevenson MRN: [id number] Date of Birth: 30-Sep-1945

## 2018-10-06 NOTE — Patient Instructions (Signed)
Access Code: [email or web address]  URL: [email or web address]  Date: 10/06/2018  Prepared by: Modena Morrow   Exercises Sit to Stand with Arms Crossed - 10 reps - 1x daily - 7x weekly Standing Single Leg Stance with Unilateral Counter Support - 3 reps - 10 seconds hold - 1x daily - 7x weekly Standing Tandem Balance with Counter Support - 3 reps - 10 seconds hold - 1x daily - 7x weekly Tandem Walking with Counter Support - 6 reps - 1x daily - 7x weekly

## 2018-10-08 ENCOUNTER — Encounter: Payer: Self-pay | Admitting: Physical Therapy

## 2018-10-08 ENCOUNTER — Ambulatory Visit: Payer: Medicare Other | Admitting: Physical Therapy

## 2018-10-08 DIAGNOSIS — R293 Abnormal posture: Secondary | ICD-10-CM | POA: Diagnosis not present

## 2018-10-08 DIAGNOSIS — R2681 Unsteadiness on feet: Secondary | ICD-10-CM

## 2018-10-08 DIAGNOSIS — R29818 Other symptoms and signs involving the nervous system: Secondary | ICD-10-CM

## 2018-10-08 DIAGNOSIS — R2689 Other abnormalities of gait and mobility: Secondary | ICD-10-CM

## 2018-10-08 NOTE — Therapy (Signed)
[facility] [address] [place], [place], [address] Phone: [phone]   Fax:  [phone]  Physical Therapy Treatment  Patient Details  Name: Christina Stevenson MRN: [id number] Date of Birth: 12/29/1945 Referring Provider (PT): Lesly Dukes   Encounter Date: 10/08/2018  PT End of Session - 10/08/18 1633    Visit Number  3    Number of Visits  17    Date for PT Re-Evaluation  11/20/18    Authorization Type  [employer]HC Medicare    PT Start Time  1540    PT Stop Time  1620    PT Time Calculation (min)  40 min    Activity Tolerance  Patient tolerated treatment well    Behavior During Therapy  [facility] for tasks assessed/performed;Impulsive;Flat affect       Past Medical History:  Diagnosis Date  . Allergic rhinitis, cause unspecified   . Anxiety   . BACK PAIN 07/09/2010   Qualifier: Diagnosis of  By: Coralee Pesa MD, Levada Schilling   . Bundle branch block, unspecified   . Cervical spondylosis without myelopathy 08/08/2015  . Depression 08/2009   multiple stressors: including son who has suffered 2 CVA's and is in a rehab facility, financial difficulties, loss of pets, etc.  . Depression   . Dermatitis   . GERD (gastroesophageal reflux disease)    on bid omeprazole  . HERPES ZOSTER 09/11/2009   Qualifier: Diagnosis of  By: Coralee Pesa MD, Levada Schilling   . History of shingles   . Hyperlipemia   . Hypertension   . Irritable bowel syndrome   . Memory disturbance   . Nocturnal leg cramps 11/19/2016  . 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 Obesity   . Osteoarthrosis, unspecified whether generalized or localized, unspecified site   . Parkinson disease (HCC) 06/02/12   Probable; being follwed by Dr. Anne Hahn of [facility], has been started on Requip  . REM sleep behavior disorder   . Sciatica of right side   . Spinal stenosis in cervical region   . Uterovaginal prolapse, incomplete    with cystocele    Past Surgical History:  Procedure Laterality  Date  . ABDOMINAL HYSTERECTOMY    . bladder resuspension procedure    . CERVICAL LAMINECTOMY    . LUMBAR LAMINECTOMY    . Trigger finger surgery Right    Thumb    There were no vitals filed for this visit.  Subjective Assessment - 10/08/18 1543    Subjective  Denies falls or changes.      Patient Stated Goals  Pt's goal for physical therapy is to help with tremors, get more walking.    Currently in Pain?  No/denies         [facility] Adult PT Treatment/Exercise - 10/08/18 0001      Transfers   Transfers  Sit to Stand;Stand to Sit    Sit to Stand  5: Supervision;Without upper extremity assist    Stand to Sit  5: Supervision;Without upper extremity assist    Number of Reps  10 reps    Transfer Cueing  cues for forward weight shift      Knee/Hip Exercises: Aerobic   Other Aerobic  Scifit level 1.5 all 4 extremities x 6 minutes for strength and endurance and flexibility        PWR [facility]) - 10/08/18 1629    PWR! exercises  Moves in sitting    PWR! Up  x 20    PWR! Rock  x  20    PWR! Twist  x20    PWR! Step  x20    Comments  Provided mirror for visual feedbacka as well as visual and tactile feedbakc from PTA.  Educated pt and spouse on PWR! Moves videos online as additional resource for technique.       Balance Exercises - 10/08/18 1631      Balance Exercises: Standing   SLS  Eyes open;Solid surface;Intermittent upper extremity support;3 reps;10 secs    Tandem Gait  Intermittent upper extremity support;Other reps (comment)   8' x 8 reps       PT Education - 10/08/18 1632    Education Details  PWR! Moves in Sitting, YouTube for PWR! moves as Theatre stage manager, [employer] Engineer, maintenance (IT)) Educated  Patient;Spouse    Methods  Explanation;Demonstration;Tactile cues;Verbal cues;Handout    Comprehension  Verbalized understanding;Returned demonstration       PT Short Term Goals - 09/21/18 1219      PT SHORT TERM GOAL #1   Title  Pt will perform HEP with husband's  supervision, for improved transfers, balance, gait.  TARGET  10/22/2018    Time  4    Period  Weeks    Status  New    Target Date  10/22/18      PT SHORT TERM GOAL #2   Title  Pt will perform 5x sit<>stand test in less than or equal to 12.5 seconds for improved transfer safety and efficiency.    Time  4    Period  Weeks    Status  New    Target Date  10/22/18      PT SHORT TERM GOAL #3   Title  Pt will improve TUG score to less than or equal to 13.5 seconds for decreased fall risk.    Time  4    Period  Weeks    Status  New    Target Date  10/22/18      PT SHORT TERM GOAL #4   Title  Pt will recover balance with 2 steps or less in posterior direction with posterior push and release test, for improved balance recovery.    Time  4    Period  Weeks    Status  New    Target Date  10/22/18      PT SHORT TERM GOAL #5   Title  Pt/husband will verbalize understanding of fall prevention in home environment.    Time  4    Period  Weeks    Status  New    Target Date  10/22/18        PT Long Term Goals - 09/21/18 1223      PT LONG TERM GOAL #1   Title  Pt/husband will verbalize plans for continued community fitness upon d/c from PT.  TARGET 11/19/2018    Time  8    Period  Weeks    Status  New    Target Date  11/19/18      PT LONG TERM GOAL #2   Title  Pt will improve DGI score to at least 19/24 for decreased fall risk.    Time  8    Period  Weeks    Status  New    Target Date  11/19/18      PT LONG TERM GOAL #3   Title  Pt will improve TUG cognitive score to less than or equal to 10% difference with TUG score, for improved ability  for dual tasking with gait.    Time  8    Period  Weeks    Status  New    Target Date  11/19/18      PT LONG TERM GOAL #4   Title  Pt will ambulate at least 1000 ft, indoor and outdoor surfaces, supervision, with least restrictive assistive device, for improved safety with outdoor and long distance gait.    Time  8    Period  Weeks    Status   New    Target Date  11/19/18      PT LONG TERM GOAL #5   Title  Pt will perform floor>stand transfer with minimal assistance, for improved safety with fall recovery.    Time  8    Period  Weeks    Status  New    Target Date  11/19/18            Plan - 10/08/18 1634    Clinical Impression Statement  Skilled session focused on reviewing HEP and performing PWR! moves along with providing as HEP.  Pt needs verbal, tactile and visual cues during exercises for intensity and technique.  Continue PT per POC.    Rehab Potential  Good    PT Frequency  2x / week    PT Duration  8 weeks   plus eval   PT Treatment/Interventions  ADLs/Self Care Home Management;Balance training;DME Instruction;Gait training;Neuromuscular re-education;Functional mobility training;Therapeutic activities;Patient/family education;Therapeutic exercise    PT Next Visit Plan  Review PWR! Moves in sitting.  Add standing PWR if appropriate.  Continue balance, posture, transfers.  Fall prevention education    PT Home Exercise Plan  Access Code: [email or web address]    Consulted and Agree with Plan of Care  Patient;Family member/caregiver    Family Member Consulted  husband       Patient will benefit from skilled therapeutic intervention in order to improve the following deficits and impairments:  Abnormal gait, Decreased balance, Decreased safety awareness, Decreased mobility, Decreased strength, Difficulty walking, Postural dysfunction  Visit Diagnosis: Other abnormalities of gait and mobility  Unsteadiness on feet  Abnormal posture  Other symptoms and signs involving the nervous system     Problem List Patient Active Problem List   Diagnosis Date Noted  . Insomnia 05/27/2018  . Hallucinations 05/27/2018  . Need for immunization against influenza 05/27/2018  . 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 Constipation 07/01/2017  . Nocturnal leg cramps 11/19/2016  . UTI (urinary tract infection) 11/12/2016  . Cervical  spondylosis without myelopathy 08/08/2015  . Pins and needles sensation 02/22/2015  . Headache 02/22/2015  . Memory loss 10/05/2012  . 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 Parkinson disease (HCC) 04/15/2012  . Dysuria 12/24/2011  . Heart palpitations 12/24/2011  . Hot flashes 12/24/2011  . Dysphagia 02/11/2011  . Preventative health care 01/14/2011  . Depression 08/18/2009  . HLD (hyperlipidemia) 09/16/2006  . Essential hypertension 09/16/2006  . Allergic rhinitis 09/16/2006  . GERD 09/16/2006    Newell Coralenise Terry , PTA [facility] 10/08/18 4:36 PM Phone: [phone] Fax: [phone]   [facility] [address] [place], [place], [address] Phone: [phone]   Fax:  [phone]  Name: Christina Stevenson MRN: [id number] Date of Birth: [birth year]

## 2018-10-13 ENCOUNTER — Ambulatory Visit: Payer: Medicare Other | Admitting: Physical Therapy

## 2018-10-13 ENCOUNTER — Encounter: Payer: Self-pay | Admitting: Physical Therapy

## 2018-10-13 DIAGNOSIS — R293 Abnormal posture: Secondary | ICD-10-CM

## 2018-10-13 DIAGNOSIS — R2689 Other abnormalities of gait and mobility: Secondary | ICD-10-CM

## 2018-10-13 DIAGNOSIS — R29818 Other symptoms and signs involving the nervous system: Secondary | ICD-10-CM | POA: Diagnosis not present

## 2018-10-13 DIAGNOSIS — R2681 Unsteadiness on feet: Secondary | ICD-10-CM

## 2018-10-13 NOTE — Therapy (Signed)
[facility] [address] [place], [place], [address] Phone: [phone]   Fax:  [phone]  Physical Therapy Treatment  Patient Details  Name: SABRYN LAFORTE MRN: [id number] Date of Birth: [birth year] Referring Provider (PT): Lesly Dukes   Encounter Date: 10/13/2018  PT End of Session - 10/13/18 1542    Visit Number  4    Number of Visits  17    Date for PT Re-Evaluation  11/20/18    Authorization Type  [employer] Medicare    PT Start Time  1450    PT Stop Time  1531    PT Time Calculation (min)  41 min    Activity Tolerance  Patient tolerated treatment well    Behavior During Therapy  [facility] for tasks assessed/performed;Impulsive;Flat affect       Past Medical History:  Diagnosis Date  . Allergic rhinitis, cause unspecified   . Anxiety   . BACK PAIN 07/09/2010   Qualifier: Diagnosis of  By: Coralee Pesa MD, Levada Schilling   . Bundle branch block, unspecified   . Cervical spondylosis without myelopathy 08/08/2015  . Depression 08/2009   multiple stressors: including son who has suffered 2 CVA's and is in a rehab facility, financial difficulties, loss of pets, etc.  . Depression   . Dermatitis   . GERD (gastroesophageal reflux disease)    on bid omeprazole  . HERPES ZOSTER 09/11/2009   Qualifier: Diagnosis of  By: Coralee Pesa MD, Levada Schilling   . History of shingles   . Hyperlipemia   . Hypertension   . Irritable bowel syndrome   . Memory disturbance   . Nocturnal leg cramps 11/19/2016  . 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 Obesity   . Osteoarthrosis, unspecified whether generalized or localized, unspecified site   . Parkinson disease (HCC) 06/02/12   Probable; being follwed by Dr. Anne Hahn of [facility], has been started on Requip  . REM sleep behavior disorder   . Sciatica of right side   . Spinal stenosis in cervical region   . Uterovaginal prolapse, incomplete    with cystocele    Past Surgical History:  Procedure Laterality  Date  . ABDOMINAL HYSTERECTOMY    . bladder resuspension procedure    . CERVICAL LAMINECTOMY    . LUMBAR LAMINECTOMY    . Trigger finger surgery Right    Thumb    There were no vitals filed for this visit.  Subjective Assessment - 10/13/18 1456    Subjective  Not having a good day.  "I haven't fallen yet today."  Having neck pain, L buttock pain, eyes feel blurry.  Has days like this at times.    Patient Stated Goals  Pt's goal for physical therapy is to help with tremors, get more walking.    Currently in Pain?  Yes    Pain Score  3     Pain Location  Neck    Pain Orientation  Left    Pain Descriptors / Indicators  Aching    Pain Type  Chronic pain    Pain Onset  More than a month ago    Pain Frequency  Intermittent    Aggravating Factors   since she walked on Monday    Pain Relieving Factors  tylenol    Multiple Pain Sites  Yes    Pain Score  3   increases to a 7 at times   Pain Location  Buttocks    Pain Orientation  Left  Pain Descriptors / Indicators  Aching;Dull    Pain Type  Chronic pain    Pain Onset  More than a month ago    Pain Frequency  Intermittent    Aggravating Factors   laying in one position too long    Pain Relieving Factors  getting up and walking        [facility] Adult PT Treatment/Exercise - 10/13/18 0001      Bed Mobility   Bed Mobility  Rolling Right;Rolling Left;Right Sidelying to Sit;Sit to Sidelying Right    Rolling Right  Contact Guard/Touching assist    Rolling Left  Contact Guard/Touching assist    Right Sidelying to Sit  Contact Guard/Touching assist    Sit to Sidelying Right  Contact Guard/Touching assist      Transfers   Transfers  Sit to Stand;Stand to Sit    Sit to Stand  5: Supervision    Stand to Sit  5: Supervision      Ambulation/Gait   Ambulation/Gait  Yes    Ambulation/Gait Assistance  4: Min guard;5: Supervision    Ambulation/Gait Assistance Details  --    Ambulation Distance (Feet)  110 Feet   x 3   Assistive device   Straight cane    Gait Pattern  Step-through pattern;Decreased arm swing - right;Decreased arm swing - left;Trunk flexed;Poor foot clearance - left;Poor foot clearance - right;Decreased step length - right;Decreased step length - left    Ambulation Surface  Level;Indoor      Exercises   Exercises  Lumbar      Lumbar Exercises: Supine   Clam  10 reps;2 seconds    Bridge  Non-compliant;10 reps;2 seconds    Other Supine Lumbar Exercises  LE rotation side to side x 10;LE marching x 15 each side    Other Supine Lumbar Exercises  PWR! Up x 15 and PWR! Rock x 10-needs verbal, visual and tactile cues for intensity.       Knee/Hip Exercises: Aerobic   Other Aerobic  Scifit level 1.5 all 4 extremities x 5 minutes for flexibity and endurance      Knee/Hip Exercises: Sidelying   Clams  x 10 each side        PT Short Term Goals - 09/21/18 1219      PT SHORT TERM GOAL #1   Title  Pt will perform HEP with husband's supervision, for improved transfers, balance, gait.  TARGET  10/22/2018    Time  4    Period  Weeks    Status  New    Target Date  10/22/18      PT SHORT TERM GOAL #2   Title  Pt will perform 5x sit<>stand test in less than or equal to 12.5 seconds for improved transfer safety and efficiency.    Time  4    Period  Weeks    Status  New    Target Date  10/22/18      PT SHORT TERM GOAL #3   Title  Pt will improve TUG score to less than or equal to 13.5 seconds for decreased fall risk.    Time  4    Period  Weeks    Status  New    Target Date  10/22/18      PT SHORT TERM GOAL #4   Title  Pt will recover balance with 2 steps or less in posterior direction with posterior push and release test, for improved balance recovery.    Time  4    Period  Weeks    Status  New    Target Date  10/22/18      PT SHORT TERM GOAL #5   Title  Pt/husband will verbalize understanding of fall prevention in home environment.    Time  4    Period  Weeks    Status  New    Target Date  10/22/18         PT Long Term Goals - 09/21/18 1223      PT LONG TERM GOAL #1   Title  Pt/husband will verbalize plans for continued community fitness upon d/c from PT.  TARGET 11/19/2018    Time  8    Period  Weeks    Status  New    Target Date  11/19/18      PT LONG TERM GOAL #2   Title  Pt will improve DGI score to at least 19/24 for decreased fall risk.    Time  8    Period  Weeks    Status  New    Target Date  11/19/18      PT LONG TERM GOAL #3   Title  Pt will improve TUG cognitive score to less than or equal to 10% difference with TUG score, for improved ability for dual tasking with gait.    Time  8    Period  Weeks    Status  New    Target Date  11/19/18      PT LONG TERM GOAL #4   Title  Pt will ambulate at least 1000 ft, indoor and outdoor surfaces, supervision, with least restrictive assistive device, for improved safety with outdoor and long distance gait.    Time  8    Period  Weeks    Status  New    Target Date  11/19/18      PT LONG TERM GOAL #5   Title  Pt will perform floor>stand transfer with minimal assistance, for improved safety with fall recovery.    Time  8    Period  Weeks    Status  New    Target Date  11/19/18            Plan - 10/13/18 1542    Clinical Impression Statement  Skilled session focused on flexibility, strengthening and functional mobility.  Pt reports not feeling well today but states she has days similar to these.  Gait unsteady at begining of treatment and pt very guarded but appears improved at end of session.  Continue PT per POC.    Rehab Potential  Good    PT Frequency  2x / week    PT Duration  8 weeks   plus eval   PT Treatment/Interventions  ADLs/Self Care Home Management;Balance training;DME Instruction;Gait training;Neuromuscular re-education;Functional mobility training;Therapeutic activities;Patient/family education;Therapeutic exercise    PT Next Visit Plan  Review PWR! Moves in sitting.  Add standing PWR if appropriate.   Try supine PWR!  (may need to modify for back)  Continue balance, posture, transfers.  Fall prevention education    PT Home Exercise Plan  Access Code: [email or web address]    Consulted and Agree with Plan of Care  Patient;Family member/caregiver    Family Member Consulted  husband       Patient will benefit from skilled therapeutic intervention in order to improve the following deficits and impairments:  Abnormal gait, Decreased balance, Decreased safety awareness, Decreased mobility, Decreased strength, Difficulty walking, Postural dysfunction  Visit Diagnosis:  Other abnormalities of gait and mobility  Unsteadiness on feet  Abnormal posture  Other symptoms and signs involving the nervous system     Problem List Patient Active Problem List   Diagnosis Date Noted  . Insomnia 05/27/2018  . Hallucinations 05/27/2018  . Need for immunization against influenza 05/27/2018  . 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 Constipation 07/01/2017  . Nocturnal leg cramps 11/19/2016  . UTI (urinary tract infection) 11/12/2016  . Cervical spondylosis without myelopathy 08/08/2015  . Pins and needles sensation 02/22/2015  . Headache 02/22/2015  . Memory loss 10/05/2012  . 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 Parkinson disease (HCC) 04/15/2012  . Dysuria 12/24/2011  . Heart palpitations 12/24/2011  . Hot flashes 12/24/2011  . Dysphagia 02/11/2011  . Preventative health care 01/14/2011  . Depression 08/18/2009  . HLD (hyperlipidemia) 09/16/2006  . Essential hypertension 09/16/2006  . Allergic rhinitis 09/16/2006  . GERD 09/16/2006    Newell Coralenise Terry Kataryna Mcquilkin, PTA [facility] 10/13/18 3:46 PM Phone: [phone] Fax: [phone]   [facility] [address] [place], [place], [address] Phone: [phone]   Fax:  [phone]  Name: Payton MccallumBonnie M Garrott MRN: [id number] Date of Birth: 11-18-1945

## 2018-10-15 ENCOUNTER — Ambulatory Visit: Payer: Medicare Other | Admitting: Physical Therapy

## 2018-10-15 ENCOUNTER — Encounter: Payer: Self-pay | Admitting: Physical Therapy

## 2018-10-15 DIAGNOSIS — R29818 Other symptoms and signs involving the nervous system: Secondary | ICD-10-CM | POA: Diagnosis not present

## 2018-10-15 DIAGNOSIS — R2689 Other abnormalities of gait and mobility: Secondary | ICD-10-CM | POA: Diagnosis not present

## 2018-10-15 DIAGNOSIS — R2681 Unsteadiness on feet: Secondary | ICD-10-CM | POA: Diagnosis not present

## 2018-10-15 DIAGNOSIS — R293 Abnormal posture: Secondary | ICD-10-CM | POA: Diagnosis not present

## 2018-10-15 NOTE — Therapy (Signed)
[facility] [address] [place], [place], [address] Phone: [phone]   Fax:  [phone]  Physical Therapy Treatment  Patient Details  Name: Christina Stevenson MRN: [id number] Date of Birth: September 02, 1945 Referring Provider (PT): Lesly Dukes   Encounter Date: 10/15/2018  PT End of Session - 10/15/18 1743    Visit Number  5    Number of Visits  17    Date for PT Re-Evaluation  11/20/18    Authorization Type  UHC Medicare    PT Start Time  1405    PT Stop Time  1445    PT Time Calculation (min)  40 min    Activity Tolerance  Patient tolerated treatment well    Behavior During Therapy  [facility] for tasks assessed/performed       Past Medical History:  Diagnosis Date  . Allergic rhinitis, cause unspecified   . Anxiety   . BACK PAIN 07/09/2010   Qualifier: Diagnosis of  By: Coralee Pesa MD, Levada Schilling   . Bundle branch block, unspecified   . Cervical spondylosis without myelopathy 08/08/2015  . Depression 08/2009   multiple stressors: including son who has suffered 2 CVA's and is in a rehab facility, financial difficulties, loss of pets, etc.  . Depression   . Dermatitis   . GERD (gastroesophageal reflux disease)    on bid omeprazole  . HERPES ZOSTER 09/11/2009   Qualifier: Diagnosis of  By: Coralee Pesa MD, Levada Schilling   . History of shingles   . Hyperlipemia   . Hypertension   . Irritable bowel syndrome   . Memory disturbance   . Nocturnal leg cramps 11/19/2016  . 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 Obesity   . Osteoarthrosis, unspecified whether generalized or localized, unspecified site   . Parkinson disease (HCC) 06/02/12   Probable; being follwed by Dr. Anne Hahn of [facility], has been started on Requip  . REM sleep behavior disorder   . Sciatica of right side   . Spinal stenosis in cervical region   . Uterovaginal prolapse, incomplete    with cystocele    Past Surgical History:  Procedure Laterality Date  . ABDOMINAL  HYSTERECTOMY    . bladder resuspension procedure    . CERVICAL LAMINECTOMY    . LUMBAR LAMINECTOMY    . Trigger finger surgery Right    Thumb    There were no vitals filed for this visit.  Subjective Assessment - 10/15/18 1407    Subjective  Today is better. Has had alot of headaches lately.     Patient Stated Goals  Pt's goal for physical therapy is to help with tremors, get more walking.    Currently in Pain?  No/denies    Pain Onset  --    Pain Onset  --                       [facility] Adult PT Treatment/Exercise - 10/15/18 0001      Ambulation/Gait   Ambulation/Gait Assistance  5: Supervision    Ambulation/Gait Assistance Details  vc for step length, velocity,     Ambulation Distance (Feet)  230 Feet    Assistive device  None    Gait Pattern  Step-through pattern;Decreased arm swing - right;Decreased arm swing - left;Trunk flexed;Poor foot clearance - left;Poor foot clearance - right;Decreased step length - right;Decreased step length - left    Ambulation Surface  Level;Indoor        PWR [facility]) -  10/15/18 1435    PWR! exercises  Moves in sitting;Moves in supine    PWR! Up  1   increased scapular pain   PWR! Twist  10   knees bent; max cues   PWR! Step  6   modifed to supine hip flexion with bent legs; incr pain   Comments  poor tolerance as increasing her back pain (Up and Step) despite modifications); did tolerate Twist well with knees bent    PWR! Up  10    PWR! Rock  10    PWR! Twist  10    PWR! Step  10   modified to out with RLE, in with RLE; out with LLE, in with           PT Short Term Goals - 09/21/18 1219      PT SHORT TERM GOAL #1   Title  Pt will perform HEP with husband's supervision, for improved transfers, balance, gait.  TARGET  10/22/2018    Time  4    Period  Weeks    Status  New    Target Date  10/22/18      PT SHORT TERM GOAL #2   Title  Pt will perform 5x sit<>stand test in less than or equal to 12.5 seconds for improved  transfer safety and efficiency.    Time  4    Period  Weeks    Status  New    Target Date  10/22/18      PT SHORT TERM GOAL #3   Title  Pt will improve TUG score to less than or equal to 13.5 seconds for decreased fall risk.    Time  4    Period  Weeks    Status  New    Target Date  10/22/18      PT SHORT TERM GOAL #4   Title  Pt will recover balance with 2 steps or less in posterior direction with posterior push and release test, for improved balance recovery.    Time  4    Period  Weeks    Status  New    Target Date  10/22/18      PT SHORT TERM GOAL #5   Title  Pt/husband will verbalize understanding of fall prevention in home environment.    Time  4    Period  Weeks    Status  New    Target Date  10/22/18        PT Long Term Goals - 09/21/18 1223      PT LONG TERM GOAL #1   Title  Pt/husband will verbalize plans for continued community fitness upon d/c from PT.  TARGET 11/19/2018    Time  8    Period  Weeks    Status  New    Target Date  11/19/18      PT LONG TERM GOAL #2   Title  Pt will improve DGI score to at least 19/24 for decreased fall risk.    Time  8    Period  Weeks    Status  New    Target Date  11/19/18      PT LONG TERM GOAL #3   Title  Pt will improve TUG cognitive score to less than or equal to 10% difference with TUG score, for improved ability for dual tasking with gait.    Time  8    Period  Weeks    Status  New    Target  Date  11/19/18      PT LONG TERM GOAL #4   Title  Pt will ambulate at least 1000 ft, indoor and outdoor surfaces, supervision, with least restrictive assistive device, for improved safety with outdoor and long distance gait.    Time  8    Period  Weeks    Status  New    Target Date  11/19/18      PT LONG TERM GOAL #5   Title  Pt will perform floor>stand transfer with minimal assistance, for improved safety with fall recovery.    Time  8    Period  Weeks    Status  New    Target Date  11/19/18             Plan - 10/15/18 1744    Clinical Impression Statement  Skilled session focused on education in modified supine PWR exercises for flexibility and improved bed mobility to lessen her strain on her back. Proceeded to seated PWR exercises which pt tolerated better with better ROM/mobility compared to supine exercises. She reported her neck and back felt better by the end of the session. She can continue to benefit from skilled PT to work towards her goals.     Rehab Potential  Good    PT Frequency  2x / week    PT Duration  8 weeks   plus eval   PT Treatment/Interventions  ADLs/Self Care Home Management;Balance training;DME Instruction;Gait training;Neuromuscular re-education;Functional mobility training;Therapeutic activities;Patient/family education;Therapeutic exercise    PT Next Visit Plan  chk STGs by 3/6; Review PWR! Moves in sitting (has been given for HEP).  Add standing PWR if appropriate.  Continue balance, posture, transfers.  Fall prevention education    PT Home Exercise Plan  Access Code: [email or web address]    Consulted and Agree with Plan of Care  Patient;Family member/caregiver    Family Member Consulted  husband       Patient will benefit from skilled therapeutic intervention in order to improve the following deficits and impairments:  Abnormal gait, Decreased balance, Decreased safety awareness, Decreased mobility, Decreased strength, Difficulty walking, Postural dysfunction  Visit Diagnosis: Other symptoms and signs involving the nervous system     Problem List Patient Active Problem List   Diagnosis Date Noted  . Insomnia 05/27/2018  . Hallucinations 05/27/2018  . Need for immunization against influenza 05/27/2018  . 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 Constipation 07/01/2017  . Nocturnal leg cramps 11/19/2016  . UTI (urinary tract infection) 11/12/2016  . Cervical spondylosis without myelopathy 08/08/2015  . Pins and needles sensation 02/22/2015  . Headache  02/22/2015  . Memory loss 10/05/2012  . 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 Parkinson disease (HCC) 04/15/2012  . Dysuria 12/24/2011  . Heart palpitations 12/24/2011  . Hot flashes 12/24/2011  . Dysphagia 02/11/2011  . Preventative health care 01/14/2011  . Depression 08/18/2009  . HLD (hyperlipidemia) 09/16/2006  . Essential hypertension 09/16/2006  . Allergic rhinitis 09/16/2006  . GERD 09/16/2006    Zena Amos, PT 10/15/2018, 5:54 PM  [facility] [address] [place], [place], [address] Phone: [phone]   Fax:  [phone]  Name: Christina Stevenson MRN: [id number] Date of Birth: Nov 08, 1945

## 2018-10-18 ENCOUNTER — Ambulatory Visit: Payer: Medicare Other | Attending: Neurology | Admitting: Physical Therapy

## 2018-10-18 ENCOUNTER — Other Ambulatory Visit: Payer: Self-pay | Admitting: Neurology

## 2018-10-20 ENCOUNTER — Ambulatory Visit: Payer: Medicare Other | Admitting: Physical Therapy

## 2018-10-25 ENCOUNTER — Ambulatory Visit: Payer: Medicare Other | Admitting: Physical Therapy

## 2018-10-27 ENCOUNTER — Other Ambulatory Visit: Payer: Self-pay | Admitting: Neurology

## 2018-10-27 ENCOUNTER — Ambulatory Visit: Payer: Medicare Other | Admitting: Physical Therapy

## 2018-10-27 MED ORDER — ROPINIROLE HCL 1 MG PO TABS: 1.0000 mg | ORAL_TABLET | Freq: Three times a day (TID) | ORAL | 3 refills | Status: DC

## 2018-11-01 ENCOUNTER — Ambulatory Visit: Payer: Medicare Other | Admitting: Physical Therapy

## 2018-11-03 ENCOUNTER — Ambulatory Visit: Payer: Medicare Other | Admitting: Physical Therapy

## 2018-11-05 ENCOUNTER — Encounter: Payer: Self-pay | Admitting: Physical Therapy

## 2018-11-05 NOTE — Therapy (Signed)
[facility] [address] [place], [place], [address] Phone: [phone]   Fax:  [phone]  Patient Details  Name: Christina Stevenson MRN: [id number] Date of Birth: 18-Mar-1946 Referring Provider:  No ref. provider found  Encounter Date: 11/05/2018   PHYSICAL THERAPY DISCHARGE SUMMARY  Visits from Start of Care: 5 (last visit 10/15/2018)  Current functional level related to goals / functional outcomes: PT Long Term Goals - 09/21/18 1223      PT LONG TERM GOAL #1   Title  Pt/husband will verbalize plans for continued community fitness upon d/c from PT.  TARGET 11/19/2018    Time  8    Period  Weeks    Status  New    Target Date  11/19/18      PT LONG TERM GOAL #2   Title  Pt will improve DGI score to at least 19/24 for decreased fall risk.    Time  8    Period  Weeks    Status  New    Target Date  11/19/18      PT LONG TERM GOAL #3   Title  Pt will improve TUG cognitive score to less than or equal to 10% difference with TUG score, for improved ability for dual tasking with gait.    Time  8    Period  Weeks    Status  New    Target Date  11/19/18      PT LONG TERM GOAL #4   Title  Pt will ambulate at least 1000 ft, indoor and outdoor surfaces, supervision, with least restrictive assistive device, for improved safety with outdoor and long distance gait.    Time  8    Period  Weeks    Status  New    Target Date  11/19/18      PT LONG TERM GOAL #5   Title  Pt will perform floor>stand transfer with minimal assistance, for improved safety with fall recovery.    Time  8    Period  Weeks    Status  New    Target Date  11/19/18      PT Short Term Goals - 09/21/18 1219      PT SHORT TERM GOAL #1   Title  Pt will perform HEP with husband's supervision, for improved transfers, balance, gait.  TARGET  10/22/2018    Time  4    Period  Weeks    Status  New    Target Date  10/22/18      PT SHORT TERM GOAL #2   Title   Pt will perform 5x sit<>stand test in less than or equal to 12.5 seconds for improved transfer safety and efficiency.    Time  4    Period  Weeks    Status  New    Target Date  10/22/18      PT SHORT TERM GOAL #3   Title  Pt will improve TUG score to less than or equal to 13.5 seconds for decreased fall risk.    Time  4    Period  Weeks    Status  New    Target Date  10/22/18      PT SHORT TERM GOAL #4   Title  Pt will recover balance with 2 steps or less in posterior direction with posterior push and release test, for improved balance recovery.    Time  4    Period  Weeks  Status  New    Target Date  10/22/18      PT SHORT TERM GOAL #5   Title  Pt/husband will verbalize understanding of fall prevention in home environment.    Time  4    Period  Weeks    Status  New    Target Date  10/22/18      Goals not able to be assessed or fully addressed, as pt did not return after 10/15/2018 visit.  PT spoke with patient on 10/25/2018 and pt wanted to cancel all remaining appointments due to not having time for therapy in her schedule at this time.   Remaining deficits: See eval   Education / Equipment: Initiated HEP, unable to fully complete due to pt not returning after 10/15/2018 visit.  Plan: Patient agrees to discharge.  Patient goals were not met. Patient is being discharged due to not returning since the last visit.  ?????       Jakia Kennebrew W. 11/05/2018, 1:39 PM  Mady Haagensen, PT 11/05/18 1:41 PM Phone: [phone] Fax: [facility] [address] [place], [place], [address] Phone: [phone]   Fax:  [phone]

## 2018-11-16 ENCOUNTER — Other Ambulatory Visit: Payer: Self-pay | Admitting: Neurology

## 2018-12-14 ENCOUNTER — Other Ambulatory Visit: Payer: Self-pay | Admitting: Internal Medicine

## 2018-12-14 ENCOUNTER — Other Ambulatory Visit: Payer: Self-pay | Admitting: Neurology

## 2018-12-14 DIAGNOSIS — E785 Hyperlipidemia, unspecified: Secondary | ICD-10-CM

## 2018-12-21 ENCOUNTER — Telehealth: Payer: Self-pay

## 2018-12-21 NOTE — Telephone Encounter (Signed)
I attempted to reach the pt and was connected with her husband Greggory Stallion (ok pat er dpr).  I advised due to current COVID 19 pandemic, our office is severely reducing in office visits until further notice, in order to minimize the risk to our patients and healthcare providers.   I offered a video visit on 12/22/2018 at 11:30 am and husband accepted.   Pt's husband understands that although there may be some limitations with this type of visit, we will take all precautions to reduce any security or privacy concerns.  He understands that this will be treated like an in office visit and we will file with pt's insurance, and there may be a patient responsible charge related to this service. E-mail address [email or web address].  Meds have been updated.

## 2018-12-22 ENCOUNTER — Other Ambulatory Visit: Payer: Self-pay

## 2018-12-22 ENCOUNTER — Ambulatory Visit (INDEPENDENT_AMBULATORY_CARE_PROVIDER_SITE_OTHER): Payer: Medicare Other | Admitting: Neurology

## 2018-12-22 ENCOUNTER — Encounter: Payer: Self-pay | Admitting: Neurology

## 2018-12-22 ENCOUNTER — Other Ambulatory Visit: Payer: Self-pay | Admitting: *Deleted

## 2018-12-22 DIAGNOSIS — F331 Major depressive disorder, recurrent, moderate: Secondary | ICD-10-CM | POA: Diagnosis not present

## 2018-12-22 DIAGNOSIS — R413 Other amnesia: Secondary | ICD-10-CM

## 2018-12-22 DIAGNOSIS — G20 Parkinson's disease: Secondary | ICD-10-CM

## 2018-12-22 DIAGNOSIS — K219 Gastro-esophageal reflux disease without esophagitis: Secondary | ICD-10-CM

## 2018-12-22 MED ORDER — SERTRALINE HCL 50 MG PO TABS: 50.0000 mg | ORAL_TABLET | Freq: Every day | ORAL | 3 refills | Status: DC

## 2018-12-22 MED ORDER — PANTOPRAZOLE SODIUM 20 MG PO TBEC: 20.0000 mg | DELAYED_RELEASE_TABLET | Freq: Every day | ORAL | 3 refills | Status: DC

## 2018-12-22 MED ORDER — ROPINIROLE HCL 1 MG PO TABS: 1.0000 mg | ORAL_TABLET | Freq: Three times a day (TID) | ORAL | 3 refills | Status: DC

## 2018-12-22 NOTE — Progress Notes (Signed)
Virtual Visit via Video Note  I connected with Christina Stevenson on 12/22/18 at 11:30 AM EDT by a video enabled telemedicine application and verified that I am speaking with the correct person using two identifiers.  Location: Patient: The patient is at home. Provider: Physician in office.   I discussed the limitations of evaluation and management by telemedicine and the availability of in person appointments. The patient expressed understanding and agreed to proceed.  History of Present Illness: Christina Stevenson is a 73 year old right-handed white female with a history of Parkinson's disease.  The patient has had some problems with gait instability, she had a fall on the last visit but she has been safe without any further falls since last seen.  The patient has gotten some physical therapy, her husband believes that her walking has improved.  The patient has had some mild troubles with memory and she has had some hallucinations and delusional thinking.  This has continued to some degree but has improved in frequency, the patient has not agitated or upset with the hallucinations.  The Requip dose has been decreased, she currently is on 1 mg 3 times daily.  She remains on Sinemet 25/100 mg tablets taking 1.5 tablets 3 times daily.  She takes clonazepam in the evening.  She has had some back pain, she has pain when she first gets out of bed and then when she gets up and moves around it improves.  Walking longer distances will worsen the pain.  She has not been very active recently, she is not walking on a regular basis.  She has had some increased depression, her appetite has reduced over the last 6 weeks, she is drinking boost during the day.  The patient is on Zoloft 25 mg, she tolerates this well.   Observations/Objective: The video evaluation reveals the patient is alert and cooperative, she has some mild dyskinetic movements of the head and neck.  The patient has a symmetric face, some masking  the face is seen.  She is able to protrude the tongue in the midline with good lateral movement of the tongue.  She has good finger-nose-finger and heel shin bilaterally.  The patient has a relatively normal speech pattern, no aphasia or dysarthria is noted.  She is able to arise from a seated position with arms crossed, once up, she can walk independently.  Slight decrease in arm swing is seen.  Tandem gait is slightly unsteady.  Romberg is negative.  Assessment and Plan: 1.  Parkinson's disease  2.  Mild memory disorder  3.  Mild gait disorder  4.  Depression  5.  Hallucinations, delusional thinking  The patient will be increased on Zoloft 50 mg daily, a prescription was sent in.  A prescription was also sent in for the Requip.  We will not alter the Sinemet dosing.  She will try to exercise more on a regular basis, this may improve the appetite if she does this and if the depression is improved.  The patient will follow-up in 4 months.  Follow Up Instructions: 7263-month follow-up with me.   I discussed the assessment and treatment plan with the patient. The patient was provided an opportunity to ask questions and all were answered. The patient agreed with the plan and demonstrated an understanding of the instructions.   The patient was advised to call back or seek an in-person evaluation if the symptoms worsen or if the condition fails to improve as anticipated.  I provided  23 minutes of non-face-to-face time during this encounter.   York Spaniel, MD

## 2019-01-26 ENCOUNTER — Other Ambulatory Visit: Payer: Self-pay

## 2019-01-26 DIAGNOSIS — K219 Gastro-esophageal reflux disease without esophagitis: Secondary | ICD-10-CM

## 2019-01-26 MED ORDER — PANTOPRAZOLE SODIUM 20 MG PO TBEC: 20.0000 mg | DELAYED_RELEASE_TABLET | Freq: Every day | ORAL | 3 refills | Status: DC

## 2019-03-05 ENCOUNTER — Other Ambulatory Visit: Payer: Self-pay | Admitting: Student in an Organized Health Care Education/Training Program

## 2019-03-05 ENCOUNTER — Other Ambulatory Visit: Payer: Self-pay | Admitting: Neurology

## 2019-03-05 ENCOUNTER — Other Ambulatory Visit: Payer: Self-pay | Admitting: Internal Medicine

## 2019-03-05 DIAGNOSIS — F331 Major depressive disorder, recurrent, moderate: Secondary | ICD-10-CM

## 2019-03-07 NOTE — Telephone Encounter (Signed)
Last ov 05/27/18 Next ov 03/16/19 w/pcp

## 2019-03-12 ENCOUNTER — Other Ambulatory Visit: Payer: Self-pay | Admitting: Student in an Organized Health Care Education/Training Program

## 2019-03-12 ENCOUNTER — Other Ambulatory Visit: Payer: Self-pay | Admitting: Internal Medicine

## 2019-03-12 DIAGNOSIS — I10 Essential (primary) hypertension: Secondary | ICD-10-CM

## 2019-03-12 DIAGNOSIS — F331 Major depressive disorder, recurrent, moderate: Secondary | ICD-10-CM

## 2019-03-14 NOTE — Telephone Encounter (Signed)
Patient should contact provider first (This was increased and prescribed per neurology's note on 5/06 at 50mg )

## 2019-03-14 NOTE — Telephone Encounter (Signed)
Next appt with pcp 03/16/19

## 2019-03-16 ENCOUNTER — Other Ambulatory Visit: Payer: Self-pay

## 2019-03-16 ENCOUNTER — Encounter: Payer: Self-pay | Admitting: Internal Medicine

## 2019-03-16 ENCOUNTER — Other Ambulatory Visit: Payer: Self-pay | Admitting: Neurology

## 2019-03-16 ENCOUNTER — Ambulatory Visit (INDEPENDENT_AMBULATORY_CARE_PROVIDER_SITE_OTHER): Payer: Medicare Other | Admitting: Internal Medicine

## 2019-03-16 VITALS — BP 116/76 | HR 95 | Temp 98.2°F | Wt 164.8 lb

## 2019-03-16 DIAGNOSIS — Z79899 Other long term (current) drug therapy: Secondary | ICD-10-CM

## 2019-03-16 DIAGNOSIS — Z0000 Encounter for general adult medical examination without abnormal findings: Secondary | ICD-10-CM

## 2019-03-16 DIAGNOSIS — K219 Gastro-esophageal reflux disease without esophagitis: Secondary | ICD-10-CM | POA: Diagnosis not present

## 2019-03-16 DIAGNOSIS — R350 Frequency of micturition: Secondary | ICD-10-CM | POA: Diagnosis not present

## 2019-03-16 DIAGNOSIS — E785 Hyperlipidemia, unspecified: Secondary | ICD-10-CM | POA: Diagnosis not present

## 2019-03-16 DIAGNOSIS — I10 Essential (primary) hypertension: Secondary | ICD-10-CM

## 2019-03-16 DIAGNOSIS — F0281 Dementia in other diseases classified elsewhere with behavioral disturbance: Secondary | ICD-10-CM

## 2019-03-16 DIAGNOSIS — R300 Dysuria: Secondary | ICD-10-CM

## 2019-03-16 DIAGNOSIS — N3000 Acute cystitis without hematuria: Secondary | ICD-10-CM

## 2019-03-16 DIAGNOSIS — G20 Parkinson's disease: Secondary | ICD-10-CM

## 2019-03-16 MED ORDER — PANTOPRAZOLE SODIUM 20 MG PO TBEC: 20.0000 mg | DELAYED_RELEASE_TABLET | Freq: Two times a day (BID) | ORAL | 3 refills | Status: DC

## 2019-03-16 NOTE — Assessment & Plan Note (Signed)
She follows with Dr. Jannifer Franklin with neurology for her Parkinson's. During last visit her zoloft was increased to 50 mg qd and requip was decreased to 1 mg tid. She is continued on clonazepam prn in the evenings and sinemet. She continues to have falls, mostly when she is outside gardening as she tries to do things that she probably should not do on her own. He says they have all the home equipment they need currently. Her husband continues to try to keep a close eye on her. She denies dizziness or increase in parkinson's symptoms.   - f/u neurology in two months - continue current medications

## 2019-03-16 NOTE — Assessment & Plan Note (Addendum)
She has increased urinary frequency and dysuria for the past week. She denies urgency, fever, chills, or nausea.   - UA with reflex ordered - will f/u UA for antibiotics - PCN allergy.   ADDENDUM: UA positive for leuks, bacteria. Rx for bactrim sent.

## 2019-03-16 NOTE — Progress Notes (Signed)
   CC: dysuria  HPI:  [name] is a 73 y.o. with PMH as below. She has a history of parkinson's with dementia and delusional features and is accompanied by her husband who provided some of the history. She has increasaed urinary frequency and dysuria for the past week. She denies urgency, fever, hematuria or chills. She also has had increased belching and reflux after eating as her last refill of her protonix was qd and she usually takes this two times her day. She has occasional nausea but no vomiting. She denies abdominal pain, changes in stool color or consistency or recent weight loss. Per her husband she does not really like to eat anything but sweets these days, and she added that she does not like to drink water but prefers soft drinks.   Please see A&P for assessment of the patient's acute and chronic medical conditions.    Past Medical History:  Diagnosis Date  . Allergic rhinitis, cause unspecified   . Anxiety   . BACK PAIN 07/09/2010   Qualifier: Diagnosis of  By: Nance Pew MD, Laurene Footman   . Bundle branch block, unspecified   . Cervical spondylosis without myelopathy 08/08/2015  . Depression 08/2009   multiple stressors: including son who has suffered 2 CVA's and is in a rehab facility, financial difficulties, loss of pets, etc.  . Depression   . Dermatitis   . GERD (gastroesophageal reflux disease)    on bid omeprazole  . HERPES ZOSTER 09/11/2009   Qualifier: Diagnosis of  By: Nance Pew MD, Laurene Footman   . History of shingles   . Hyperlipemia   . Hypertension   . Irritable bowel syndrome   . Memory disturbance   . Nocturnal leg cramps 11/19/2016  . 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 Obesity   . Osteoarthrosis, unspecified whether generalized or localized, unspecified site   . Parkinson disease ([place]) 06/02/12   Probable; being follwed by Dr. Jannifer Franklin of [employer], has been started on Requip  . REM sleep behavior disorder   . Sciatica of right side   . Spinal stenosis in cervical  region   . Uterovaginal prolapse, incomplete    with cystocele   Review of Systems:   ROS negative except as noted in HPI.   Physical Exam:  Constitution: NAD, appears stated age 52: RRR, no m/r/g  Respiratory: CTA, no w/r/r  Abdominal: soft, non-distended, NTTP Skin: c/d/i    Vitals:   03/16/19 1308  BP: 116/76  Pulse: 95  Temp: 98.2 F (36.8 C)  TempSrc: Oral  SpO2: 99%  Weight: 164 lb 12.8 oz (74.8 kg)     Assessment & Plan:   See Encounters Tab for problem based charting.  Patient discussed with Dr. Evette Doffing

## 2019-03-16 NOTE — Assessment & Plan Note (Signed)
FIT test ordered

## 2019-03-16 NOTE — Telephone Encounter (Signed)
The patient was given a 90-day prescription for the clonazepam on 24 January 2019, the prescription is not due until around 26 April 2019.  For this reason, the prescription will be denied.

## 2019-03-16 NOTE — Assessment & Plan Note (Signed)
Increased symptoms of reflux since her protonix was accidentally refilled as qd instead of bid.   - needs protonix 20 mg bid  - f/u in 4-6 weeks if symptoms do not improve - provided education on proper diet and lifestyle changes

## 2019-03-16 NOTE — Assessment & Plan Note (Signed)
She takes lipitor 20 mg qd. Last lipid panel 2016.   - lipid panel ordered

## 2019-03-16 NOTE — Assessment & Plan Note (Deleted)
She has increased urinary frequency and dysuria for the past week. She denies urgency, fever, chills, or nausea.   - UA with reflex ordered - will f/u UA for antibiotics - PCN allergy.

## 2019-03-16 NOTE — Patient Instructions (Addendum)
Thank you for allowing [place] to provide your care today. Today we discussed your burning with urination and parkinson's.    I have ordered the following labs for you:  Basic metabolic panel and lipid panel and urinalysis.    I will call if any are abnormal. If you Urinalysis comes back positive, I will call to start antibiotics as well.   Today we made the following changes to your medications:   I have refilled your protonix to 20 mg two times per day as previous. If symptoms do not improve in four weeks, please follow-up in clinic.   Please follow-up if symptoms of burning with urination worsen or fail to improve. .    Should you have any questions or concerns please call the internal medicine clinic at [phone].

## 2019-03-16 NOTE — Assessment & Plan Note (Addendum)
BP Readings from Last 3 Encounters:  03/16/19 116/76  08/24/18 136/74  05/27/18 (!) 127/53   Currently taking quinipril 20 mg qd. Blood pressure stable.   - cont. quinipril  - BMP - f/u six months for BMP and blood pressure check

## 2019-03-17 ENCOUNTER — Other Ambulatory Visit: Payer: Self-pay

## 2019-03-17 ENCOUNTER — Other Ambulatory Visit: Payer: Self-pay | Admitting: Internal Medicine

## 2019-03-17 LAB — URINALYSIS, ROUTINE W REFLEX MICROSCOPIC
Bilirubin, UA: NEGATIVE
Glucose, UA: NEGATIVE
Ketones, UA: NEGATIVE
Nitrite, UA: NEGATIVE
Protein,UA: NEGATIVE
RBC, UA: NEGATIVE
Specific Gravity, UA: 1.015 (ref 1.005–1.030)
Urobilinogen, Ur: 0.2 mg/dL (ref 0.2–1.0)
pH, UA: 5.5 (ref 5.0–7.5)

## 2019-03-17 LAB — LIPID PANEL
Chol/HDL Ratio: 2.4 ratio (ref 0.0–4.4)
Cholesterol, Total: 138 mg/dL (ref 100–199)
HDL: 57 mg/dL (ref >39)
LDL Calculated: 57 mg/dL (ref 0–99)
Triglycerides: 119 mg/dL (ref 0–149)
VLDL Cholesterol Cal: 24 mg/dL (ref 5–40)

## 2019-03-17 LAB — BMP8+ANION GAP
Anion Gap: 15 mmol/L (ref 10.0–18.0)
BUN/Creatinine Ratio: 15 (ref 12–28)
BUN: 15 mg/dL (ref 8–27)
CO2: 25 mmol/L (ref 20–29)
Calcium: 9.3 mg/dL (ref 8.7–10.3)
Chloride: 100 mmol/L (ref 96–106)
Creatinine, Ser: 0.98 mg/dL (ref 0.57–1.00)
GFR calc Af Amer: 67 mL/min/{1.73_m2} (ref >59)
GFR calc non Af Amer: 58 mL/min/{1.73_m2} — ABNORMAL LOW (ref >59)
Glucose: 115 mg/dL — ABNORMAL HIGH (ref 65–99)
Potassium: 4.2 mmol/L (ref 3.5–5.2)
Sodium: 140 mmol/L (ref 134–144)

## 2019-03-17 LAB — MICROSCOPIC EXAMINATION
Casts: NONE SEEN /lpf
RBC: NONE SEEN /hpf (ref 0–2)

## 2019-03-17 MED ORDER — SULFAMETHOXAZOLE-TRIMETHOPRIM 400-80 MG PO TABS: 2.0000 | ORAL_TABLET | Freq: Two times a day (BID) | ORAL | 0 refills | Status: DC

## 2019-03-17 MED ORDER — SERTRALINE HCL 50 MG PO TABS: 50.0000 mg | ORAL_TABLET | Freq: Every day | ORAL | 1 refills | Status: DC

## 2019-03-17 NOTE — Progress Notes (Signed)
Internal Medicine Clinic Attending  Case discussed with Dr. Seawell at the time of the visit.  We reviewed the resident's history and exam and pertinent patient test results.  I agree with the assessment, diagnosis, and plan of care documented in the resident's note.    

## 2019-03-17 NOTE — Addendum Note (Signed)
Addended by: Molli Hazard A on: 03/17/2019 10:09 AM   Modules accepted: Orders

## 2019-04-07 ENCOUNTER — Encounter: Payer: Self-pay | Admitting: *Deleted

## 2019-04-07 NOTE — Progress Notes (Signed)
Received IFOBT kit by mail on 04-06-2019 Unable to process and analyze due to age of specimen.  Collection date 03-18-2019  A new kit mailed to Ms Torry on 04-07-19 requesting a recollection.  Maryan Rued, PBT 04-07-19 09:21

## 2019-04-23 DIAGNOSIS — Z0000 Encounter for general adult medical examination without abnormal findings: Secondary | ICD-10-CM | POA: Diagnosis not present

## 2019-05-03 ENCOUNTER — Other Ambulatory Visit: Payer: Self-pay

## 2019-05-03 ENCOUNTER — Other Ambulatory Visit: Payer: Medicare Other

## 2019-05-03 DIAGNOSIS — Z0000 Encounter for general adult medical examination without abnormal findings: Secondary | ICD-10-CM

## 2019-05-04 ENCOUNTER — Other Ambulatory Visit: Payer: Self-pay | Admitting: Neurology

## 2019-05-04 LAB — FECAL OCCULT BLOOD, IMMUNOCHEMICAL: Fecal Occult Bld: NEGATIVE

## 2019-06-02 ENCOUNTER — Ambulatory Visit: Payer: Medicare Other | Admitting: Neurology

## 2019-06-02 ENCOUNTER — Encounter: Payer: Self-pay | Admitting: Neurology

## 2019-06-02 ENCOUNTER — Other Ambulatory Visit: Payer: Self-pay

## 2019-06-02 VITALS — BP 116/64 | HR 98 | Temp 98.2°F | Ht 60.0 in | Wt 164.0 lb

## 2019-06-02 DIAGNOSIS — G4752 REM sleep behavior disorder: Secondary | ICD-10-CM | POA: Diagnosis not present

## 2019-06-02 DIAGNOSIS — R413 Other amnesia: Secondary | ICD-10-CM

## 2019-06-02 DIAGNOSIS — G20 Parkinson's disease: Secondary | ICD-10-CM

## 2019-06-02 MED ORDER — DONEPEZIL HCL 5 MG PO TABS: 5.0000 mg | ORAL_TABLET | Freq: Every day | ORAL | 1 refills | Status: DC

## 2019-06-02 MED ORDER — QUETIAPINE FUMARATE 25 MG PO TABS: ORAL_TABLET | ORAL | 3 refills | Status: DC

## 2019-06-02 NOTE — Progress Notes (Signed)
Reason for visit: Parkinson's disease, memory disorder  Christina Stevenson is an 73 y.o. female  History of present illness:  Ms. Christina Stevenson is a 73 year old right-handed white female with a history of Parkinson's disease.  The patient has predominately right upper extremity tremors, and she has a gait disorder.  She has spontaneously improved significantly over the last 3 weeks with her ability to walk, between June and July she was falling frequently, she fell up to 8 times.  The patient does walk regularly, she will go to the store and use a cart as a walker and be able to walk longer distances with good stability.  The patient however continues to have hallucinations during the day and in the evening.  The husband indicates that at times she gets agitated with these hallucinations.  The patient does have trouble with urinary urgency and frequent urinary tract infections, she was told to drink cranberry juice but she does not like the taste of it.  The patient indicates that she wakes up spontaneously at 4 AM in the morning, but she is able to get back to sleep.  She continues to have some generalized memory problems.  She returns to the office today for an evaluation.  Past Medical History:  Diagnosis Date  . Allergic rhinitis, cause unspecified   . Anxiety   . BACK PAIN 07/09/2010   Qualifier: Diagnosis of  By: Nance Pew MD, Laurene Footman   . Bundle branch block, unspecified   . Cervical spondylosis without myelopathy 08/08/2015  . Depression 08/2009   multiple stressors: including son who has suffered 2 CVA's and is in a rehab facility, financial difficulties, loss of pets, etc.  . Depression   . Dermatitis   . GERD (gastroesophageal reflux disease)    on bid omeprazole  . HERPES ZOSTER 09/11/2009   Qualifier: Diagnosis of  By: Nance Pew MD, Laurene Footman   . History of shingles   . Hyperlipemia   . Hypertension   . Irritable bowel syndrome   . Memory disturbance   . Nocturnal leg cramps 11/19/2016   . 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 Obesity   . Osteoarthrosis, unspecified whether generalized or localized, unspecified site   . Parkinson disease ([place]) 06/02/12   Probable; being follwed by Dr. Jannifer Franklin of [place], has been started on Requip  . REM sleep behavior disorder   . Sciatica of right side   . Spinal stenosis in cervical region   . Uterovaginal prolapse, incomplete    with cystocele    Past Surgical History:  Procedure Laterality Date  . ABDOMINAL HYSTERECTOMY    . bladder resuspension procedure    . CERVICAL LAMINECTOMY    . LUMBAR LAMINECTOMY    . Trigger finger surgery Right    Thumb    Family History  Problem Relation Age of Onset  . Heart Problems Father        lived to be 21 yo  . Cancer - Lung Sister     Social history:  reports that she has never smoked. She has never used smokeless tobacco. She reports that she does not drink alcohol or use drugs.    Allergies  Allergen Reactions  . Penicillins Hives    Has patient had a PCN reaction causing immediate rash, facial/tongue/throat swelling, SOB or lightheadedness with hypotension: Yes Has patient had a PCN reaction causing severe rash involving mucus membranes or skin necrosis: No Has patient had a PCN reaction that required hospitalization: No Has patient had  a PCN reaction occurring within the last 10 years: No If all of the above answers are "NO", then may proceed with Cephalosporin use. *per patient has tolerated keflex before   . Prednisone     Swelling, emotionally volatile   . Latex Rash    Medications:  Prior to Admission medications   Medication Sig Start Date End Date Taking? Authorizing Provider  atorvastatin (LIPITOR) 20 MG tablet TAKE 1 TABLET BY MOUTH  DAILY 12/14/18  Yes Earl Lagos, MD  carbidopa-levodopa (SINEMET IR) 25-100 MG tablet TAKE 1 AND 1/2 TABLETS BY  MOUTH 3 TIMES DAILY 12/14/18  Yes York Spaniel, MD  cefpodoxime (VANTIN) 200 MG tablet Take 1 tablet (200 mg total) by  mouth 2 (two) times daily. 05/07/18  Yes Mancel Bale, MD  clobetasol ointment (TEMOVATE) 0.05 % APPLY TO AFFECTED AREA(S)  TOPICALLY TWO TIMES DAILY 03/03/18  Yes Tyson Alias, MD  clonazePAM (KLONOPIN) 0.5 MG tablet TAKE 3 TABLETS BY MOUTH AT  BEDTIME 05/04/19  Yes York Spaniel, MD  ibuprofen (ADVIL,MOTRIN) 200 MG tablet Take 200 mg by mouth every 6 (six) hours as needed for mild pain.    Yes [provider]  pantoprazole (PROTONIX) 20 MG tablet Take 1 tablet (20 mg total) by mouth 2 (two) times daily. 03/16/19  Yes Seawell, Jaimie A, DO  quinapril (ACCUPRIL) 20 MG tablet TAKE 1 TABLET BY MOUTH  DAILY 03/14/19  Yes Seawell, Jaimie A, DO  rOPINIRole (REQUIP) 1 MG tablet Take 1 tablet (1 mg total) by mouth 3 (three) times daily. 12/22/18  Yes York Spaniel, MD  sertraline (ZOLOFT) 50 MG tablet Take 1 tablet (50 mg total) by mouth daily. 03/17/19  Yes York Spaniel, MD    ROS:  Out of a complete 14 system review of symptoms, the patient complains only of the following symptoms, and all other reviewed systems are negative.  Memory troubles Walking difficulty Tremors Hallucinations  Blood pressure 116/64, pulse 98, temperature 98.2 F (36.8 C), temperature source Temporal, height 5' (1.524 m), weight 164 lb (74.4 kg), SpO2 96 %.  Physical Exam  General: The patient is alert and cooperative at the time of the examination.  The patient is moderately to markedly obese.  Skin: No significant peripheral edema is noted.   Neurologic Exam  Mental status: The patient is alert and oriented x 3 at the time of the examination. The Mini-Mental status examination done today shows a total score 25/30.   Cranial nerves: Facial symmetry is present. Speech is normal, no aphasia or dysarthria is noted. Extraocular movements are full. Visual fields are full.  Mild masking the face is seen.  Motor: The patient has good strength in all 4 extremities.  Sensory examination: Soft  touch sensation is symmetric on the face, arms, and legs.  Coordination: The patient has good finger-nose-finger and heel-to-shin bilaterally.  Resting tremors are seen bilaterally with the upper extremities.  Gait and station: The patient is able to arise from a seated position with arms crossed, once up, she can walk independently, she has some hesitation with turns.  Tandem gait was not attempted.  Romberg is negative.  Reflexes: Deep tendon reflexes are symmetric.   Assessment/Plan:  1.  Parkinson's disease  2.  Gait disorder  3.  Memory disorder  4.  Hallucinations  5.  Tremors  The patient is having some problems with urinary urgency, frequent urinary tract infections.  I have indicated that she can take cranberry tablets if she  does not like cranberry juice and take vitamin C 500 mg 3 times daily.  The patient is having hallucinations with some agitation, she will be placed on Seroquel in low-dose taking 25 mg, 1/2 tablet twice daily.  She will be placed on Aricept for the memory taking 5 mg at night.  They will call in 1 month for a dose adjustment if she tolerates the 5 mg tablets.  The patient will follow-up in 5 months.  We may consider increasing the Sinemet at that time.  The patient will remain on Requip for now.  Marlan Palau. Keith Araceli Arango MD 06/02/2019 12:09 PM  [facility] [address] [place], [place] [id number]  Phone [phone] Fax [phone]

## 2019-06-06 ENCOUNTER — Other Ambulatory Visit: Payer: Self-pay

## 2019-06-06 ENCOUNTER — Ambulatory Visit (INDEPENDENT_AMBULATORY_CARE_PROVIDER_SITE_OTHER): Payer: Medicare Other | Admitting: *Deleted

## 2019-06-06 DIAGNOSIS — Z23 Encounter for immunization: Secondary | ICD-10-CM | POA: Diagnosis not present

## 2019-07-24 ENCOUNTER — Other Ambulatory Visit: Payer: Self-pay | Admitting: Neurology

## 2019-08-09 ENCOUNTER — Other Ambulatory Visit: Payer: Self-pay | Admitting: Neurology

## 2019-09-19 ENCOUNTER — Other Ambulatory Visit: Payer: Self-pay

## 2019-09-19 MED ORDER — QUETIAPINE FUMARATE 25 MG PO TABS: ORAL_TABLET | ORAL | 3 refills | Status: DC

## 2019-09-19 MED ORDER — DONEPEZIL HCL 5 MG PO TABS: ORAL_TABLET | ORAL | 1 refills | Status: DC

## 2019-09-23 ENCOUNTER — Other Ambulatory Visit: Payer: Self-pay

## 2019-09-23 MED ORDER — QUETIAPINE FUMARATE 25 MG PO TABS: ORAL_TABLET | ORAL | 3 refills | Status: DC

## 2019-09-23 MED ORDER — DONEPEZIL HCL 5 MG PO TABS: ORAL_TABLET | ORAL | 1 refills | Status: DC

## 2019-10-08 IMAGING — CR DG CHEST 2V
2 series · 2 of 2 positions shown · non-contrast
Comparison: Chest radiograph August 31, 2005

CLINICAL DATA: Hallucinations, weakness. History of Parkinson's
disease.

EXAM:
CHEST - 2 VIEW

[chest pa]
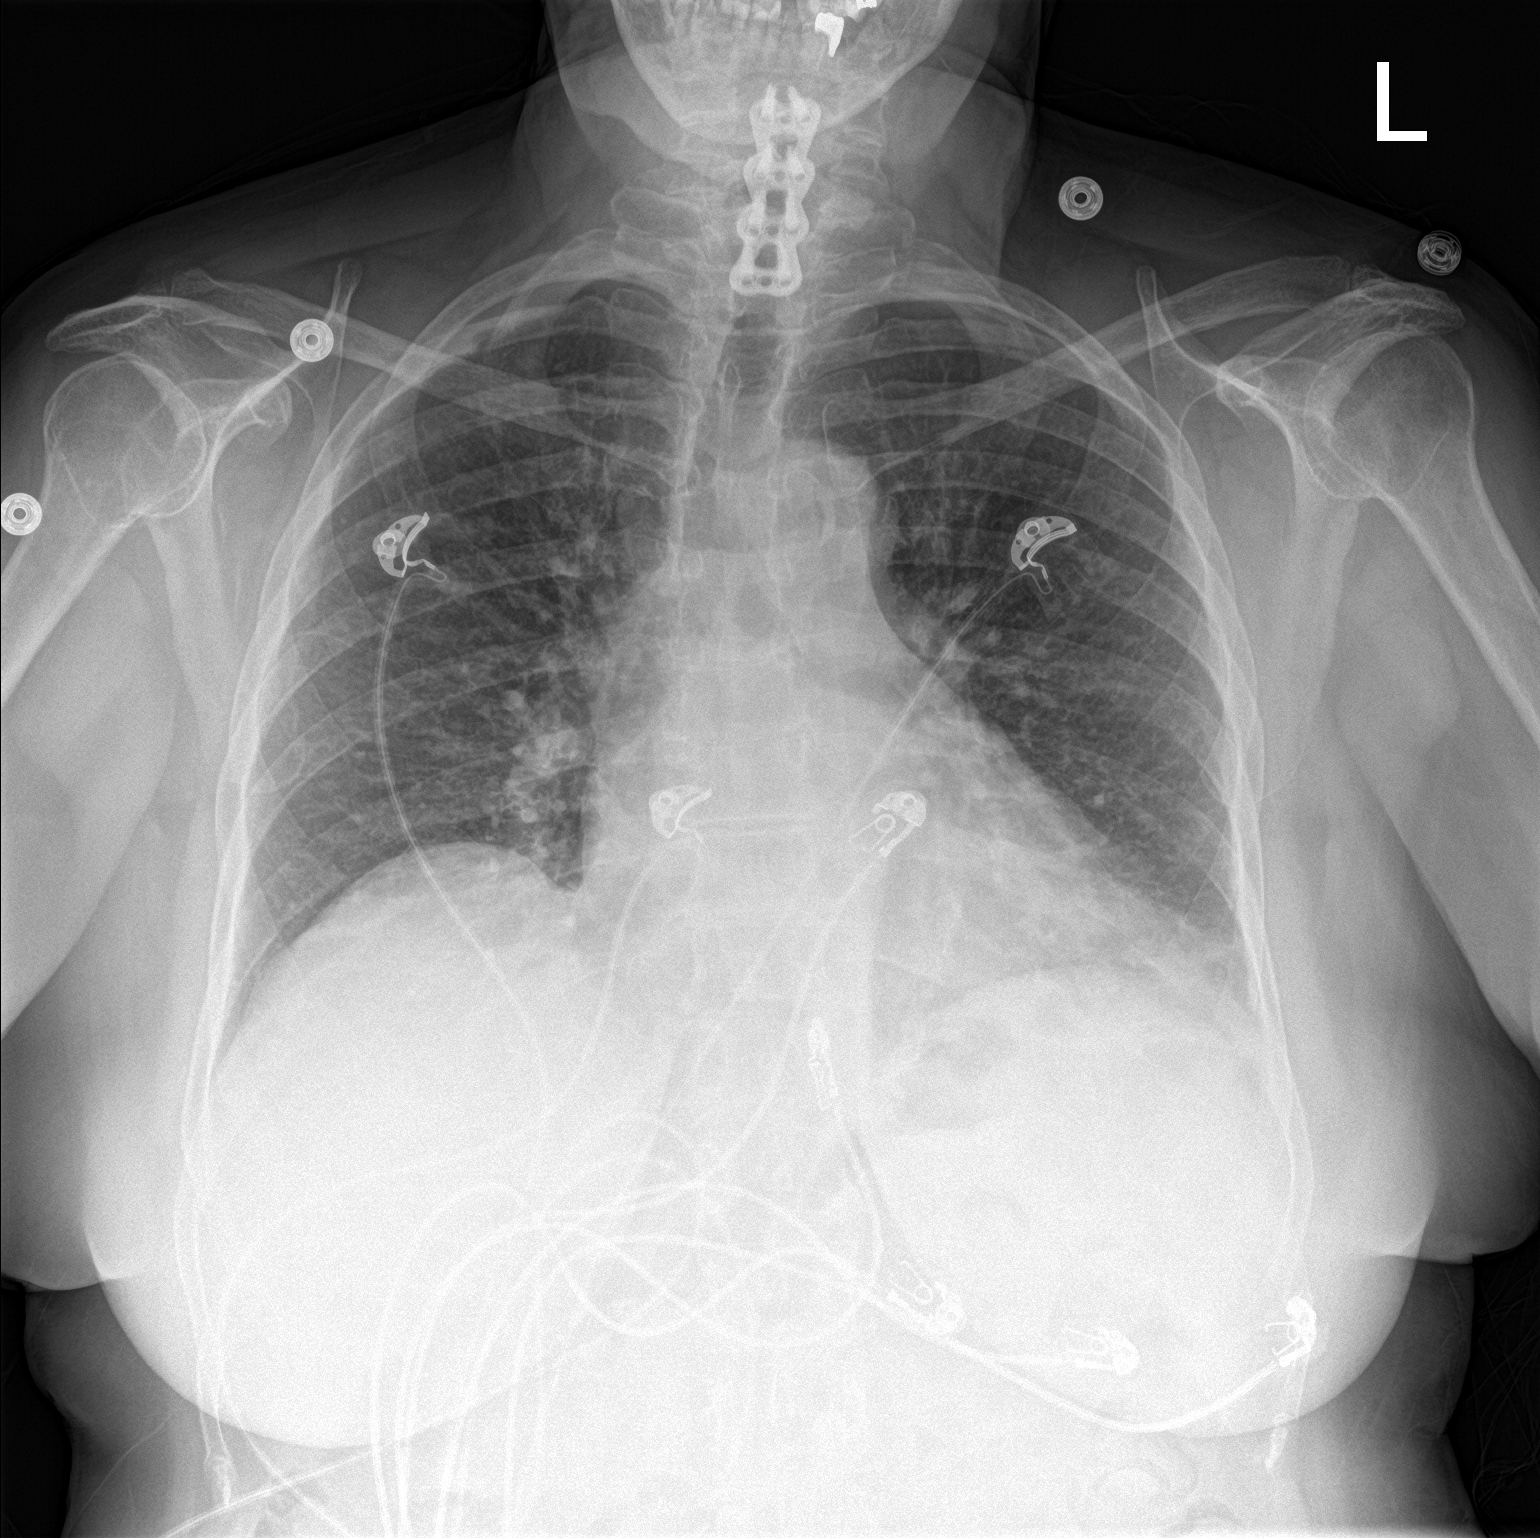

[chest lat]
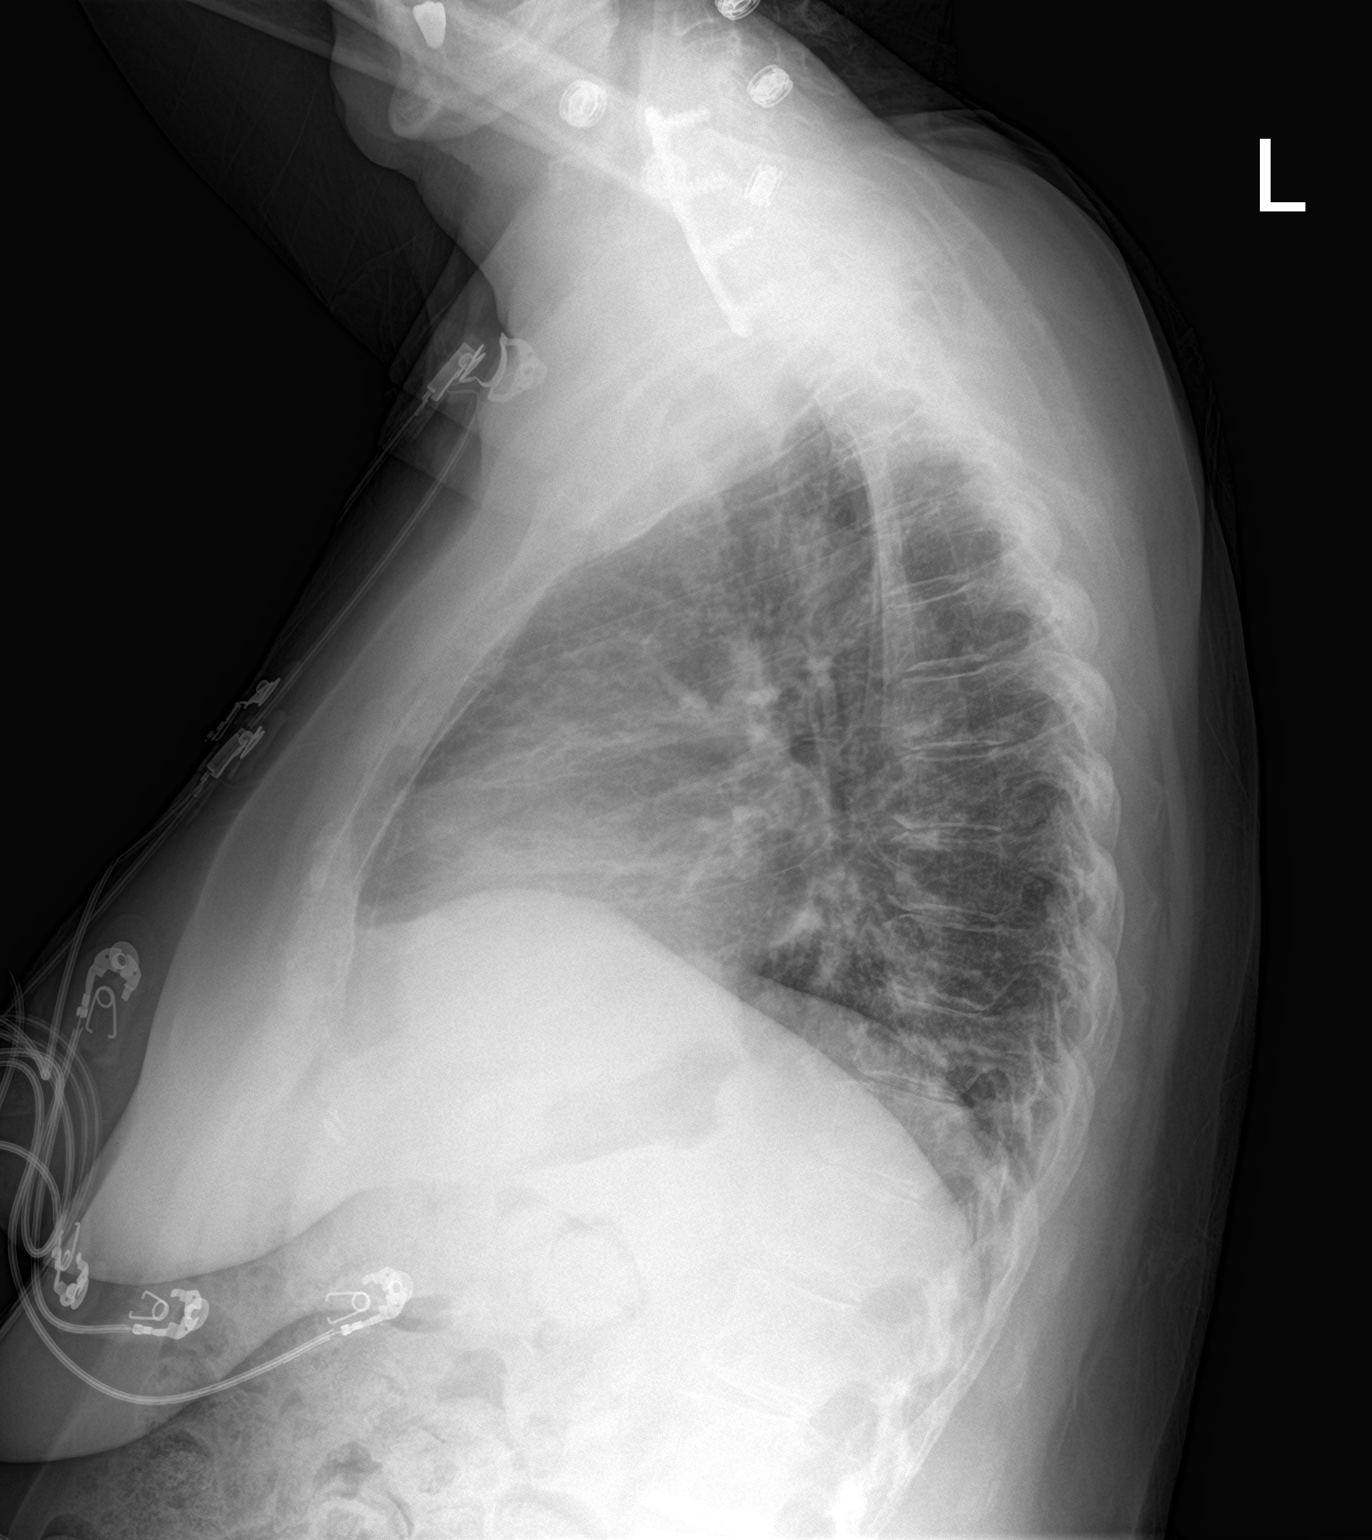

[2 of 2 positions shown; findings below may reference images not displayed]

FINDINGS: Cardiac silhouette is mildly enlarged, unchanged. Calcified aortic
arch. Mediastinal silhouette is not suspicious. Chronic bronchitic
changes with bandlike density LEFT lung base. No pleural effusion or
focal consolidation. No pneumothorax. ACDF. Large amount of retained
large bowel stool included abdomen.
IMPRESSION: 1. Chronic bronchitic changes with LEFT lung base
atelectasis/scarring.
2. Large amount of retained large bowel stool, incompletely
quantified.
3.  Aortic Atherosclerosis ([id number]).

## 2019-11-01 ENCOUNTER — Encounter: Payer: Self-pay | Admitting: Neurology

## 2019-11-01 ENCOUNTER — Other Ambulatory Visit: Payer: Self-pay

## 2019-11-01 ENCOUNTER — Ambulatory Visit: Payer: Medicare Other | Admitting: Neurology

## 2019-11-01 VITALS — BP 97/60 | HR 92 | Temp 97.6°F | Ht 60.0 in | Wt 167.0 lb

## 2019-11-01 DIAGNOSIS — R413 Other amnesia: Secondary | ICD-10-CM | POA: Diagnosis not present

## 2019-11-01 DIAGNOSIS — G20 Parkinson's disease: Secondary | ICD-10-CM | POA: Diagnosis not present

## 2019-11-01 MED ORDER — CARBIDOPA-LEVODOPA 25-100 MG PO TABS: 2.0000 | ORAL_TABLET | Freq: Three times a day (TID) | ORAL | 2 refills | Status: DC

## 2019-11-01 NOTE — Patient Instructions (Signed)
We will increase the Sinemet (carbidopa) to 2 tablets three times a day.

## 2019-11-01 NOTE — Progress Notes (Signed)
Reason for visit: Parkinson's disease  Christina Stevenson is an 74 y.o. female  History of present illness:  Christina Stevenson is a 74 year old right-handed white female with a history of Parkinson's disease.  The patient previously was having a lot of hallucinations, she was placed on low-dose Seroquel which seemed to help.  She has also been on some Aricept for memory which her husband claims has significantly improved her cognitive capacities.  She has had problems with frequent urinary tract infections and was placed on vitamin C and cranberry tablets.  The patient has not been very active, she has some adversity to exercise.  Her husband has been trying to get her up to walk.  The patient has not had any falls.  She has reported some troubles with choking at times, primarily with liquids.  Larger pills are also a problem for her.  She generally is sleeping fairly well at night currently.  She is having some difficulty getting up out of a chair, she does not freeze up, the response to the Sinemet is smooth throughout the day.  Past Medical History:  Diagnosis Date  . Allergic rhinitis, cause unspecified   . Anxiety   . BACK PAIN 07/09/2010   Qualifier: Diagnosis of  By: Nance Pew MD, Laurene Footman   . Bundle branch block, unspecified   . Cervical spondylosis without myelopathy 08/08/2015  . Depression 08/2009   multiple stressors: including son who has suffered 2 CVA's and is in a rehab facility, financial difficulties, loss of pets, etc.  . Depression   . Dermatitis   . GERD (gastroesophageal reflux disease)    on bid omeprazole  . HERPES ZOSTER 09/11/2009   Qualifier: Diagnosis of  By: Nance Pew MD, Laurene Footman   . History of shingles   . Hyperlipemia   . Hypertension   . Irritable bowel syndrome   . Memory disturbance   . Nocturnal leg cramps 11/19/2016  . 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 Obesity   . Osteoarthrosis, unspecified whether generalized or localized, unspecified site   . Parkinson  disease ([place]) 06/02/12   Probable; being follwed by Dr. Jannifer Franklin of [facility], has been started on Requip  . REM sleep behavior disorder   . Sciatica of right side   . Spinal stenosis in cervical region   . Uterovaginal prolapse, incomplete    with cystocele    Past Surgical History:  Procedure Laterality Date  . ABDOMINAL HYSTERECTOMY    . bladder resuspension procedure    . CERVICAL LAMINECTOMY    . LUMBAR LAMINECTOMY    . Trigger finger surgery Right    Thumb    Family History  Problem Relation Age of [place]  . Heart Problems Father        lived to be 71 yo  . Cancer - Lung Sister     Social history:  reports that she has never smoked. She has never used smokeless tobacco. She reports that she does not drink alcohol or use drugs.    Allergies  Allergen Reactions  . Penicillins Hives    Has patient had a PCN reaction causing immediate rash, facial/tongue/throat swelling, SOB or lightheadedness with hypotension: Yes Has patient had a PCN reaction causing severe rash involving mucus membranes or skin necrosis: No Has patient had a PCN reaction that required hospitalization: No Has patient had a PCN reaction occurring within the last 10 years: No If all of the above answers are "NO", then may proceed with Cephalosporin use. *per  patient has tolerated keflex before   . Prednisone     Swelling, emotionally volatile   . Latex Rash    Medications:  Prior to Admission medications   Medication Sig Start Date End Date Taking? Authorizing Provider  acetaminophen (TYLENOL) 500 MG tablet Take 1,000 mg by mouth as needed.   Yes [provider]  atorvastatin (LIPITOR) 20 MG tablet TAKE 1 TABLET BY MOUTH  DAILY 12/14/18  Yes Earl Lagos, MD  carbidopa-levodopa (SINEMET IR) 25-100 MG tablet Take 2 tablets by mouth 3 (three) times daily. 11/01/19  Yes York Spaniel, MD  cefpodoxime (VANTIN) 200 MG tablet Take 1 tablet (200 mg total) by mouth 2 (two) times daily. 05/07/18   Yes Mancel Bale, MD  clobetasol ointment (TEMOVATE) 0.05 % APPLY TO AFFECTED AREA(S)  TOPICALLY TWO TIMES DAILY 03/03/18  Yes Tyson Alias, MD  clonazePAM (KLONOPIN) 0.5 MG tablet TAKE 3 TABLETS BY MOUTH AT  BEDTIME 05/04/19  Yes York Spaniel, MD  donepezil (ARICEPT) 5 MG tablet TAKE 1 TABLET(5 MG) BY MOUTH AT BEDTIME 09/23/19  Yes York Spaniel, MD  ibuprofen (ADVIL,MOTRIN) 200 MG tablet Take 200 mg by mouth every 6 (six) hours as needed for mild pain.    Yes [provider]  pantoprazole (PROTONIX) 20 MG tablet Take 1 tablet (20 mg total) by mouth 2 (two) times daily. 03/16/19  Yes Seawell, Jaimie A, DO  QUEtiapine (SEROQUEL) 25 MG tablet 1/2 tablet twice a day 09/23/19  Yes York Spaniel, MD  quinapril (ACCUPRIL) 20 MG tablet TAKE 1 TABLET BY MOUTH  DAILY 03/14/19  Yes Seawell, Jaimie A, DO  rOPINIRole (REQUIP) 1 MG tablet Take 1 tablet (1 mg total) by mouth 3 (three) times daily. 12/22/18  Yes York Spaniel, MD  sertraline (ZOLOFT) 50 MG tablet TAKE 1 TABLET BY MOUTH  DAILY 08/09/19  Yes York Spaniel, MD    ROS:  Out of a complete 14 system review of symptoms, the patient complains only of the following symptoms, and all other reviewed systems are negative.  Walking difficulty Memory problems  Blood pressure 97/60, pulse 92, temperature 97.6 F (36.4 C), height 5' (1.524 m), weight 167 lb (75.8 kg).  Physical Exam  General: The patient is alert and cooperative at the time of the examination.  The patient is markedly obese.  Skin: No significant peripheral edema is noted.   Neurologic Exam  Mental status: The patient is alert and oriented x 3 at the time of the examination. The patient has apparent normal recent and remote memory, with an apparently normal attention span and concentration ability.   Cranial nerves: Facial symmetry is present. Speech is normal, no aphasia or dysarthria is noted. Extraocular movements are full. Visual fields are  full.  Mild masking of the face is seen.  Motor: The patient has good strength in all 4 extremities.  Sensory examination: Soft touch sensation is symmetric on the face, arms, and legs.  Coordination: The patient has good finger-nose-finger and heel-to-shin bilaterally.  A mild resting tremors noted with the left upper extremity.  Gait and station: The patient is able to arise from a seated position with arms crossed.  Once up, the patient is able to ambulate without assistance, some decreased arm swing is seen bilaterally.  There is a slightly stooped posture.  Tandem gait is slightly unsteady.  Romberg is negative.  No drift is seen.  Reflexes: Deep tendon reflexes are symmetric.   Assessment/Plan:  1.  Parkinson's disease  2.  Memory disturbance  The patient has had improvement in her cognitive capacity and hallucinations on medication.  We will need to go up on the Sinemet at this time taking 25/100 mg tablets, taking 2 tablets 3 times daily.  They will look out for increasing confusion.  I have emphasized that the patient needs to be exercising regularly, they are follow-up in 5 months.  Marlan Palau MD 11/01/2019 10:20 AM  [facility] [address] [place], [place] [address]  Phone [phone] Fax [phone]

## 2019-11-03 ENCOUNTER — Other Ambulatory Visit: Payer: Self-pay | Admitting: Internal Medicine

## 2019-11-03 ENCOUNTER — Other Ambulatory Visit: Payer: Self-pay | Admitting: Neurology

## 2019-11-03 DIAGNOSIS — E785 Hyperlipidemia, unspecified: Secondary | ICD-10-CM

## 2019-12-06 ENCOUNTER — Other Ambulatory Visit: Payer: Self-pay | Admitting: Internal Medicine

## 2019-12-06 DIAGNOSIS — Z1231 Encounter for screening mammogram for malignant neoplasm of breast: Secondary | ICD-10-CM

## 2019-12-08 ENCOUNTER — Encounter: Payer: Self-pay | Admitting: *Deleted

## 2019-12-08 NOTE — Progress Notes (Signed)

## 2019-12-09 ENCOUNTER — Encounter: Payer: Self-pay | Admitting: Internal Medicine

## 2019-12-09 NOTE — Progress Notes (Signed)
Things That May Be Affecting Your Health:  Alcohol  Hearing loss  Pain   x Depression x Home Safety  Sexual Health   Diabetes  Lack of physical activity  Stress   Difficulty with daily activities  Loneliness  Tiredness   Drug use  Medicines  Tobacco use  x Falls  Motor Vehicle Safety  Weight   Food choices  Oral Health  Other    YOUR PERSONALIZED HEALTH PLAN : 1. Schedule your next subsequent Medicare Wellness visit in one year 2. Attend all of your regular appointments to address your medical issues 3. Complete the preventative screenings and services   Annual Wellness Visit   Medicare Covered Preventative Screenings and Services  Services & Screenings Men and Women Who How Often Need? Date of Last Service Action  Abdominal Aortic Aneurysm Adults with AAA risk factors Once     Alcohol Misuse and Counseling All Adults Screening once a year if no alcohol misuse. Counseling up to 4 face to face sessions.     Bone Density Measurement  Adults at risk for osteoporosis Once every 2 yrs X  Needs Dexa  Lipid Panel Z13.6 All adults without CV disease Once every 5 yrs     Colorectal Cancer   Stool sample or  Colonoscopy All adults 50 and older   Once every year  Every 10 years     Depression All Adults Once a year X Today PHQ-9  Diabetes Screening Blood glucose, post glucose load, or GTT Z13.1  All adults at risk  Pre-diabetics  Once per year  Twice per year     Diabetes  Self-Management Training All adults Diabetics 10 hrs first year; 2 hours subsequent years. Requires Copay     Glaucoma  Diabetics  Family history of glaucoma  African Americans 50 yrs +  Hispanic Americans 65 yrs + Annually - requires coppay     Hepatitis C Z72.89 or F19.20  High Risk for HCV  Born between 1945 and 1965  Annually  Once X  Needs HCV testing  HIV Z11.4 All adults based on risk  Annually btw ages 15 & 65 regardless of risk  Annually > 65 yrs if at increased risk     Lung Cancer  Screening Asymptomatic adults aged 55-77 with 30 pack yr history and current smoker OR quit within the last 15 yrs Annually Must have counseling and shared decision making documentation before first screen     Medical Nutrition Therapy Adults with   Diabetes  Renal disease  Kidney transplant within past 3 yrs 3 hours first year; 2 hours subsequent years     Obesity and Counseling All adults Screening once a year Counseling if BMI 30 or higher  Today   Tobacco Use Counseling Adults who use tobacco  Up to 8 visits in one year     Vaccines Z23  Hepatitis B  Influenza   Pneumonia  Adults   Once  Once every flu season  Two different vaccines separated by one year     Next Annual Wellness Visit People with Medicare Every year  Today     Services & Screenings Women Who How Often Need  Date of Last Service Action  Mammogram  Z12.31 Women over 40 One baseline ages 35-39. Annually ager 40 yrs+ X 12/2016 Needs mammogram  Pap tests All women Annually if high risk. Every 2 yrs for normal risk women     Screening for cervical cancer with   Pap (Z01.419 nl   or Z01.411abnl) &  HPV Z11.51 Women aged 24 to 88 Once every 5 yrs     Screening pelvic and breast exams All women Annually if high risk. Every 2 yrs for normal risk women     Sexually Transmitted Diseases  Chlamydia  Gonorrhea  Syphilis All at risk adults Annually for non pregnant females at increased risk         [place] Men Who How Ofter Need  Date of Last Service Action  Prostate Cancer - DRE & PSA Men over 50 Annually.  DRE might require a copay.     Sexually Transmitted Diseases  Syphilis All at risk adults Annually for men at increased risk

## 2019-12-09 NOTE — Progress Notes (Deleted)
Things That May Be Affecting Your Health:  Alcohol  Hearing loss  Pain   x Depression x Home Safety  Sexual Health   Diabetes  Lack of physical activity  Stress   Difficulty with daily activities  Loneliness  Tiredness   Drug use  Medicines  Tobacco use  x Falls  Motor Vehicle Safety  Weight   Food choices  Oral Health  Other    YOUR PERSONALIZED HEALTH PLAN : 1. Schedule your next subsequent Medicare Wellness visit in one year 2. Attend all of your regular appointments to address your medical issues 3. Complete the preventative screenings and services   Annual Wellness Visit   Medicare Covered Preventative Screenings and Services  Services & Screenings Men and Women Who How Often Need? Date of Last Service Action  Abdominal Aortic Aneurysm Adults with AAA risk factors Once     Alcohol Misuse and Counseling All Adults Screening once a year if no alcohol misuse. Counseling up to 4 face to face sessions.     Bone Density Measurement  Adults at risk for osteoporosis Once every 2 yrs X  Needs Dexa  Lipid Panel Z13.6 All adults without CV disease Once every 5 yrs     Colorectal Cancer   Stool sample or  Colonoscopy All adults 50 and older   Once every year  Every 10 years     Depression All Adults Once a year X Today PHQ-9  Diabetes Screening Blood glucose, post glucose load, or GTT Z13.1  All adults at risk  Pre-diabetics  Once per year  Twice per year     Diabetes  Self-Management Training All adults Diabetics 10 hrs first year; 2 hours subsequent years. Requires Copay     Glaucoma  Diabetics  Family history of glaucoma  African Americans 50 yrs +  Hispanic Americans 65 yrs + Annually - requires coppay     Hepatitis C Z72.89 or F19.20  High Risk for HCV  Born between 1945 and 1965  Annually  Once X  Needs HCV testing  HIV Z11.4 All adults based on risk  Annually btw ages 67 & 53 regardless of risk  Annually > 65 yrs if at increased risk     Lung Cancer  Screening Asymptomatic adults aged 90-77 with 30 pack yr history and current smoker OR quit within the last 15 yrs Annually Must have counseling and shared decision making documentation before first screen     Medical Nutrition Therapy Adults with   Diabetes  Renal disease  Kidney transplant within past 3 yrs 3 hours first year; 2 hours subsequent years     Obesity and Counseling All adults Screening once a year Counseling if BMI 30 or higher  Today   Tobacco Use Counseling Adults who use tobacco  Up to 8 visits in one year     Vaccines Z23  Hepatitis B  Influenza   Pneumonia  Adults   Once  Once every flu season  Two different vaccines separated by one year     Next Annual Wellness Visit People with Medicare Every year  Today     Services & Screenings Women Who How Often Need  Date of Last Service Action  Mammogram  Z12.31 Women over 40 One baseline ages 67-39. Annually ager 40 yrs+ X 12/2016 Needs mammogram  Pap tests All women Annually if high risk. Every 2 yrs for normal risk women     Screening for cervical cancer with   Pap (Z01.419 nl  or Z01.411abnl) &  HPV Z11.51 Women aged 24 to 88 Once every 5 yrs     Screening pelvic and breast exams All women Annually if high risk. Every 2 yrs for normal risk women     Sexually Transmitted Diseases  Chlamydia  Gonorrhea  Syphilis All at risk adults Annually for non pregnant females at increased risk         [place] Men Who How Ofter Need  Date of Last Service Action  Prostate Cancer - DRE & PSA Men over 50 Annually.  DRE might require a copay.     Sexually Transmitted Diseases  Syphilis All at risk adults Annually for men at increased risk

## 2020-01-12 ENCOUNTER — Other Ambulatory Visit: Payer: Self-pay

## 2020-01-12 ENCOUNTER — Ambulatory Visit: Payer: Medicare Other | Admitting: Internal Medicine

## 2020-02-01 ENCOUNTER — Ambulatory Visit (HOSPITAL_COMMUNITY)
Admission: RE | Admit: 2020-02-01 | Discharge: 2020-02-01 | Disposition: A | Discharge status: Home / Self Care | Payer: Medicare Other | Source: Ambulatory Visit | Attending: Internal Medicine | Admitting: Internal Medicine

## 2020-02-01 ENCOUNTER — Other Ambulatory Visit: Payer: Self-pay

## 2020-02-01 ENCOUNTER — Encounter: Payer: Self-pay | Admitting: Internal Medicine

## 2020-02-01 ENCOUNTER — Ambulatory Visit (INDEPENDENT_AMBULATORY_CARE_PROVIDER_SITE_OTHER): Payer: Medicare Other | Admitting: Internal Medicine

## 2020-02-01 VITALS — BP 98/47 | HR 80 | Temp 98.2°F | Wt 165.9 lb

## 2020-02-01 DIAGNOSIS — R0789 Other chest pain: Secondary | ICD-10-CM

## 2020-02-01 DIAGNOSIS — G249 Dystonia, unspecified: Secondary | ICD-10-CM

## 2020-02-01 DIAGNOSIS — I10 Essential (primary) hypertension: Secondary | ICD-10-CM

## 2020-02-01 DIAGNOSIS — I447 Left bundle-branch block, unspecified: Secondary | ICD-10-CM

## 2020-02-01 DIAGNOSIS — G4700 Insomnia, unspecified: Secondary | ICD-10-CM

## 2020-02-01 DIAGNOSIS — K219 Gastro-esophageal reflux disease without esophagitis: Secondary | ICD-10-CM

## 2020-02-01 DIAGNOSIS — R079 Chest pain, unspecified: Secondary | ICD-10-CM | POA: Insufficient documentation

## 2020-02-01 DIAGNOSIS — Z0000 Encounter for general adult medical examination without abnormal findings: Secondary | ICD-10-CM

## 2020-02-01 DIAGNOSIS — G20 Parkinson's disease: Secondary | ICD-10-CM

## 2020-02-01 DIAGNOSIS — K5900 Constipation, unspecified: Secondary | ICD-10-CM

## 2020-02-01 DIAGNOSIS — R443 Hallucinations, unspecified: Secondary | ICD-10-CM

## 2020-02-01 MED ORDER — QUINAPRIL HCL 20 MG PO TABS: 10.0000 mg | ORAL_TABLET | Freq: Every day | ORAL | 3 refills | Status: DC

## 2020-02-01 MED ORDER — FAMOTIDINE 20 MG PO TABS: 20.0000 mg | ORAL_TABLET | Freq: Every evening | ORAL | 2 refills | Status: DC | PRN

## 2020-02-01 MED ORDER — DICLOFENAC SODIUM 1 % EX GEL: 4.0000 g | Freq: Four times a day (QID) | CUTANEOUS | 0 refills | Status: DC

## 2020-02-01 NOTE — Progress Notes (Signed)
   CC: reflux  HPI:  [name] is a 74 y.o. with PMH as below.   Please see A&P for assessment of the patient's acute and chronic medical conditions.   Past Medical History:  Diagnosis Date  . Allergic rhinitis 09/16/2006   Qualifier: Diagnosis of  By: Ardyth Harps MD, Minerva Ends    . Allergic rhinitis, cause unspecified   . Anxiety   . BACK PAIN 07/09/2010   Qualifier: Diagnosis of  By: Coralee Pesa MD, Levada Schilling   . Bundle branch block, unspecified   . Cervical spondylosis without myelopathy 08/08/2015  . Depression 08/2009   multiple stressors: including son who has suffered 2 CVA's and is in a rehab facility, financial difficulties, loss of pets, etc.  . Depression   . Dermatitis   . GERD (gastroesophageal reflux disease)    on bid omeprazole  . Heart palpitations 12/24/2011  . HERPES ZOSTER 09/11/2009   Qualifier: Diagnosis of  By: Coralee Pesa MD, Levada Schilling   . History of shingles   . Hot flashes 12/24/2011  . Hyperlipemia   . Hypertension   . Irritable bowel syndrome   . Memory disturbance   . Nocturnal leg cramps 11/19/2016  . 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 Nummular dermatitis 11/24/2017   Treated with clobetasol ointment 0.05% BID as needed  . Obesity   . Osteoarthrosis, unspecified whether generalized or localized, unspecified site   . Parkinson disease ([facility]) 06/02/12   Probable; being follwed by Dr. Anne Hahn of [facility], has been started on Requip  . Pins and needles sensation 02/22/2015  . REM sleep behavior disorder   . 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 Sciatica of right side   . Spinal stenosis in cervical region   . Uterovaginal prolapse, incomplete    with cystocele   Review of Systems:   Review of Systems  Constitutional: Negative for chills, fever and weight loss.  Respiratory: Negative for cough, shortness of breath and wheezing.   Cardiovascular: Negative for chest pain, palpitations and leg swelling.  Gastrointestinal: Positive for constipation. Negative for  abdominal pain, diarrhea, heartburn, nausea and vomiting.  Genitourinary: Negative for dysuria and urgency.  Musculoskeletal: Positive for back pain and myalgias. Negative for falls.  Neurological: Negative for dizziness and headaches.  Psychiatric/Behavioral: Negative for hallucinations. The patient is nervous/anxious.    Physical Exam: Constitution: NAD, appears stated age Cardio: RRR, no m/r/g, no LE edema  Respiratory: CTA, no w/r/r Abdominal: NTTP, soft, non-distended MSK: moving all extremities,  Neuro: mild confusion, pleasant Skin: c/d/i    Vitals:   02/01/20 1314  BP: (!) 98/47  Pulse: 80  Temp: 98.2 F (36.8 C)  TempSrc: Oral  SpO2: 97%  Weight: 165 lb 14.4 oz (75.3 kg)    Assessment & Plan:   See Encounters Tab for problem based charting.  Patient discussed with Dr. Antony Contras

## 2020-02-01 NOTE — Assessment & Plan Note (Addendum)
On klonipin 1.5 mg qhs per her neurologist, Dr. Anne Hahn. This medication helps her sleep. No adverse effects noted by her or her husband.

## 2020-02-01 NOTE — Assessment & Plan Note (Signed)
She has intermittent back pain from ongoing spasms and movement, which is helped by NSAIDs on occasion. She has f/u with Dr. Anne Hahn August 31st. During previous visit she had improvement in cognition, and her hallucinations have decreased after starting seroquel bid. Her husband helps take care of her at home and states that they are doing ok on their own. Discussed that if things become more difficult we can work on obtaining home health or assistance.   - continue sinemet 25/100 mg 2 tablets bid, aricept 5 mg, seroquel 25 mg bid, and requip 1 mg tid - voltaren gel prn back pain and muscle pain

## 2020-02-01 NOTE — Assessment & Plan Note (Signed)
BP Readings from Last 3 Encounters:  02/01/20 (!) 98/47  11/01/19 97/60  06/02/19 116/64   Blood pressure soft today but similar to visit on 11/01/19. Her husband states this is usually what it is at home as well. She denies dizziness, headache, shortness of breath, fever or chills. She does have chest tightness in the morning, which resolves once she gets up and she associates with anxiety. She denies chest pain or other symptoms concerning for ACS or cough. See "chest tightness" from today for more details.   - BMP - decrease quinipril to 10 mg qd. If blood pressure remains <100 systolic, discontinue.

## 2020-02-01 NOTE — Patient Instructions (Addendum)
Thank you for allowing [place] to provide your care today. Today we discussed your reflux and chest tightness.     I have ordered the following labs for you:  CBC, BMP    I will call if any are abnormal.    Today we made the following changes to your medications:   Please START taking  Famotidine 20 mg - take one tablet before as needed if symptoms of reflux or heartburn.   Please decrease quinipril to 10 mg - take 1/2 tablet daily.   Please check blood pressure. If below 100 on the top number, stop taking quinipril.   For the changes on your EKG, I have placed a referral to cardiology and ordered an echocardiogram. Please make sure to follow-up with these appointments. They will contact you.   Please follow-up in one year or sooner if needed.    Please call the internal medicine center clinic if you have any questions or concerns, we may be able to help and keep you from a long and expensive emergency room wait. Our clinic and after hours phone number is [phone], the best time to call is Monday through Friday 9 am to 4 pm but there is always someone available 24/7 if you have an emergency. If you need medication refills please notify your pharmacy one week in advance and they will send [place] a request.

## 2020-02-01 NOTE — Assessment & Plan Note (Addendum)
For the past month she has had more anxiety and chest tightness in the mornings. The chest tightness resolves when she moves around and takes her morning medications. She denies chest pain, shortness of breath, dizziness, or other symptoms concerning for ACS. It does not occur with exertion.  ECG was done which showed LBBB, no other signs of ischemia. There is no previous ECG to compare to. With no acute symptoms this process has likely occurred over time. Her chest tightness does seem related to her anxiety but with new LBBB will need cardiac evaluation and echo.   - referral to cardiology  - order for echo  - CBC, BMP

## 2020-02-01 NOTE — Assessment & Plan Note (Addendum)
Discussed cessation of protonix 20 mg bid which she has been on since 2012, she also has history of hiatal hernia seen on EGD in 2013. She continues to have GERD on occasion and feels the protonix helps. This is worsened at night when she drinks pepsi before bed. Discussed that this would worsen her symptoms and cessation would be best, but she states it makes her happy and she will probably continue to do this.   - continue protonix 20 mg bid - famotidine 20 mg qhs prn GERD  - discussed importance of avoiding NSAIDs with GERD.

## 2020-02-01 NOTE — Assessment & Plan Note (Signed)
-   covid-19 vaccine - revieved vaccine either pfizer or moderna in March

## 2020-02-01 NOTE — Assessment & Plan Note (Signed)
Improved since increased to seroquel 25 mg bid per Dr. Anne Hahn with neurology. Her husband states there still are some bad days but overall has gotten much better.

## 2020-02-01 NOTE — Progress Notes (Signed)
Internal Medicine Clinic Attending  Case discussed with Dr. Seawell at the time of the visit.  We reviewed the resident's history and exam and pertinent patient test results.  I agree with the assessment, diagnosis, and plan of care documented in the resident's note.    

## 2020-02-01 NOTE — Assessment & Plan Note (Signed)
Intermittent. Controlled by miralax as needed.

## 2020-02-02 LAB — BMP8+ANION GAP
Anion Gap: 13 mmol/L (ref 10.0–18.0)
BUN/Creatinine Ratio: 10 — ABNORMAL LOW (ref 12–28)
BUN: 9 mg/dL (ref 8–27)
CO2: 26 mmol/L (ref 20–29)
Calcium: 9.2 mg/dL (ref 8.7–10.3)
Chloride: 102 mmol/L (ref 96–106)
Creatinine, Ser: 0.88 mg/dL (ref 0.57–1.00)
GFR calc Af Amer: 75 mL/min/{1.73_m2} (ref >59)
GFR calc non Af Amer: 65 mL/min/{1.73_m2} (ref >59)
Glucose: 104 mg/dL — ABNORMAL HIGH (ref 65–99)
Potassium: 4.3 mmol/L (ref 3.5–5.2)
Sodium: 141 mmol/L (ref 134–144)

## 2020-02-02 LAB — CBC
Hematocrit: 36.2 % (ref 34.0–46.6)
Hemoglobin: 11.8 g/dL (ref 11.1–15.9)
MCH: 29.3 pg (ref 26.6–33.0)
MCHC: 32.6 g/dL (ref 31.5–35.7)
MCV: 90 fL (ref 79–97)
Platelets: 180 10*3/uL (ref 150–450)
RBC: 4.03 x10E6/uL (ref 3.77–5.28)
RDW: 13.1 % (ref 11.7–15.4)
WBC: 5.4 10*3/uL (ref 3.4–10.8)

## 2020-02-19 ENCOUNTER — Other Ambulatory Visit: Payer: Self-pay | Admitting: Internal Medicine

## 2020-02-19 DIAGNOSIS — K219 Gastro-esophageal reflux disease without esophagitis: Secondary | ICD-10-CM

## 2020-02-20 ENCOUNTER — Ambulatory Visit (HOSPITAL_COMMUNITY)
Admission: RE | Admit: 2020-02-20 | Discharge: 2020-02-20 | Disposition: A | Discharge status: Home / Self Care | Payer: Medicare Other | Source: Ambulatory Visit | Attending: Internal Medicine | Admitting: Internal Medicine

## 2020-02-20 DIAGNOSIS — I10 Essential (primary) hypertension: Secondary | ICD-10-CM | POA: Insufficient documentation

## 2020-02-20 DIAGNOSIS — R9431 Abnormal electrocardiogram [ECG] [EKG]: Secondary | ICD-10-CM | POA: Insufficient documentation

## 2020-02-20 DIAGNOSIS — I447 Left bundle-branch block, unspecified: Secondary | ICD-10-CM | POA: Diagnosis not present

## 2020-02-20 DIAGNOSIS — R0789 Other chest pain: Secondary | ICD-10-CM | POA: Insufficient documentation

## 2020-02-20 DIAGNOSIS — E785 Hyperlipidemia, unspecified: Secondary | ICD-10-CM | POA: Insufficient documentation

## 2020-02-20 NOTE — Progress Notes (Signed)
  Echocardiogram 2D Echocardiogram has been performed.  Christina Stevenson 02/20/2020, 10:53 AM

## 2020-03-15 ENCOUNTER — Other Ambulatory Visit: Payer: Self-pay | Admitting: Neurology

## 2020-04-03 NOTE — Progress Notes (Signed)
CARDIOLOGY CONSULT NOTE       Patient ID: Christina Stevenson MRN: [id number] DOB/AGE: Dec 26, 1945 74 y.o.  Admit date: (Not on file) Referring Physician: Seawell Primary Physician: Stacie Glaze, MD Primary Cardiologist: New Reason for Consultation: LBBB/Chest pain  Active Problems:   * No active hospital problems. *   HPI:  74 y.o. referred by [name] for chest pain and LBBB History of HTN and HLD She sees Dr Anne Hahn and has had hallucinations on Seroquel Also on Klonipin for insomnia and sinemet for parkinson's  Last office visit complained of atypica chest tightness in am without activity and she associates with anxiety. BP was low and Quiniprl was decreased in June ECG done in office 02/01/20 LBBB No old one to compare surprisingly given her age   Echo reviewed from 02/20/20 EF 60-65% mild LVH trivial MR no other significant findings  Had COVID vaccine in March  ROS All other systems reviewed and negative except as noted above  Past Medical History:  Diagnosis Date  . Allergic rhinitis 09/16/2006   Qualifier: Diagnosis of  By: Ardyth Harps MD, Minerva Ends    . Allergic rhinitis, cause unspecified   . Anxiety   . BACK PAIN 07/09/2010   Qualifier: Diagnosis of  By: Coralee Pesa MD, Levada Schilling   . Bundle branch block, unspecified   . Cervical spondylosis without myelopathy 08/08/2015  . Depression 08/2009   multiple stressors: including son who has suffered 2 CVA's and is in a rehab facility, financial difficulties, loss of pets, etc.  . Depression   . Dermatitis   . GERD (gastroesophageal reflux disease)    on bid omeprazole  . Heart palpitations 12/24/2011  . HERPES ZOSTER 09/11/2009   Qualifier: Diagnosis of  By: Coralee Pesa MD, Levada Schilling   . History of shingles   . Hot flashes 12/24/2011  . Hyperlipemia   . Hypertension   . Irritable bowel syndrome   . Memory disturbance   . Nocturnal leg cramps 11/19/2016  . 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 Nummular dermatitis 11/24/2017   Treated  with clobetasol ointment 0.05% BID as needed  . Obesity   . Osteoarthrosis, unspecified whether generalized or localized, unspecified site   . Parkinson disease (HCC) 06/02/12   Probable; being follwed by Dr. Anne Hahn of [facility], has been started on Requip  . Pins and needles sensation 02/22/2015  . REM sleep behavior disorder   . 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 Sciatica of right side   . Spinal stenosis in cervical region   . Uterovaginal prolapse, incomplete    with cystocele    Family History  Problem Relation Age of Onset  . Heart Problems Father        lived to be 81 yo  . Cancer - Lung Sister     Social History   Socioeconomic History  . Marital status: Married    Spouse name: Not on file  . Number of children: 2  . Years of education: 4  . Highest education level: Not on file  Occupational History  . Occupation: Retired  Tobacco Use  . Smoking status: Never Smoker  . Smokeless tobacco: Never Used  Substance and Sexual Activity  . Alcohol use: No    Comment: Occasional  . Drug use: No  . Sexual activity: Not on file  Other Topics Concern  . Not on file  Social History Narrative   Son Thayer Ohm suffered CVA x2, trach, now in rehab facility-2011   Regular exercise-yes  Patient is right handed.   Patient drinks max of 1 cup caffeine daily   Lives at home with husband    Social Determinants of Health   Financial Resource Strain:   . Difficulty of Paying Living Expenses: Not on file  Food Insecurity:   . Worried About Programme researcher, broadcasting/film/video in the Last Year: Not on file  . Ran Out of Food in the Last Year: Not on file  Transportation Needs:   . Lack of Transportation (Medical): Not on file  . Lack of Transportation (Non-Medical): Not on file  Physical Activity:   . Days of Exercise per Week: Not on file  . Minutes of Exercise per Session: Not on file  Stress:   . Feeling of Stress : Not on file  Social Connections:   . Frequency of Communication with Friends  and Family: Not on file  . Frequency of Social Gatherings with Friends and Family: Not on file  . Attends Religious Services: Not on file  . Active Member of Clubs or Organizations: Not on file  . Attends Banker Meetings: Not on file  . Marital Status: Not on file  Intimate Partner Violence:   . Fear of Current or Ex-Partner: Not on file  . Emotionally Abused: Not on file  . Physically Abused: Not on file  . Sexually Abused: Not on file    Past Surgical History:  Procedure Laterality Date  . ABDOMINAL HYSTERECTOMY    . bladder resuspension procedure    . CERVICAL LAMINECTOMY    . LUMBAR LAMINECTOMY    . Trigger finger surgery Right    Thumb      Current Outpatient Medications:  .  acetaminophen (TYLENOL) 500 MG tablet, Take 1,000 mg by mouth as needed., Disp: , Rfl:  .  atorvastatin (LIPITOR) 20 MG tablet, TAKE 1 TABLET BY MOUTH  DAILY, Disp: 90 tablet, Rfl: 3 .  carbidopa-levodopa (SINEMET IR) 25-100 MG tablet, Take 2 tablets by mouth 3 (three) times daily., Disp: 540 tablet, Rfl: 2 .  clobetasol ointment (TEMOVATE) 0.05 %, APPLY TO AFFECTED AREA(S)  TOPICALLY TWO TIMES DAILY, Disp: 30 g, Rfl: 0 .  clonazePAM (KLONOPIN) 0.5 MG tablet, TAKE 3 TABLETS BY MOUTH AT  BEDTIME, Disp: 270 tablet, Rfl: 1 .  diclofenac Sodium (VOLTAREN) 1 % GEL, Apply 4 g topically 4 (four) times daily., Disp: 4 g, Rfl: 0 .  donepezil (ARICEPT) 5 MG tablet, TAKE 1 TABLET(5 MG) BY MOUTH AT BEDTIME, Disp: 90 tablet, Rfl: 1 .  famotidine (PEPCID) 20 MG tablet, Take 1 tablet (20 mg total) by mouth at bedtime as needed for heartburn or indigestion., Disp: 60 tablet, Rfl: 2 .  ibuprofen (ADVIL,MOTRIN) 200 MG tablet, Take 200 mg by mouth every 6 (six) hours as needed for mild pain. , Disp: , Rfl:  .  pantoprazole (PROTONIX) 20 MG tablet, TAKE 1 TABLET BY MOUTH  TWICE DAILY, Disp: 180 tablet, Rfl: 3 .  QUEtiapine (SEROQUEL) 25 MG tablet, 1/2 tablet twice a day, Disp: 90 tablet, Rfl: 3 .  quinapril  (ACCUPRIL) 20 MG tablet, Take 0.5 tablets (10 mg total) by mouth daily., Disp: 90 tablet, Rfl: 3 .  rOPINIRole (REQUIP) 1 MG tablet, TAKE 1 TABLET BY MOUTH 3  TIMES DAILY, Disp: 270 tablet, Rfl: 3 .  sertraline (ZOLOFT) 50 MG tablet, TAKE 1 TABLET BY MOUTH  DAILY, Disp: 90 tablet, Rfl: 3    Physical Exam: Blood pressure 110/70, pulse 86, height 5' (1.524 m), weight 162  lb 9.6 oz (73.8 kg), SpO2 97 %.   Affect appropriate Healthy:  appears stated age HEENT: normal Neck supple with no adenopathy JVP normal no bruits no thyromegaly Lungs clear with no wheezing and good diaphragmatic motion Heart:  S1/S2 no murmur, no rub, gallop or click PMI normal Abdomen: benighn, BS positve, no tenderness, no AAA no bruit.  No HSM or HJR Distal pulses intact with no bruits No edema Neuro non-focal Skin warm and dry No muscular weakness   Labs:   Lab Results  Component Value Date   WBC 5.4 02/01/2020   HGB 11.8 02/01/2020   HCT 36.2 02/01/2020   MCV 90 02/01/2020   PLT 180 02/01/2020   No results for input(s): NA, K, CL, CO2, BUN, CREATININE, CALCIUM, PROT, BILITOT, ALKPHOS, ALT, AST, GLUCOSE in the last 168 hours.  Invalid input(s): LABALBU No results found for: CKTOTAL, CKMB, CKMBINDEX, TROPONINI  Lab Results  Component Value Date   CHOL 138 03/16/2019   CHOL 193 02/21/2015   CHOL 206 (H) 04/06/2013   Lab Results  Component Value Date   HDL 57 03/16/2019   HDL 70 02/21/2015   HDL 52 04/06/2013   Lab Results  Component Value Date   LDLCALC 57 03/16/2019   LDLCALC 90 02/21/2015   LDLCALC 131 (H) 04/06/2013   Lab Results  Component Value Date   TRIG 119 03/16/2019   TRIG 166 (H) 02/21/2015   TRIG 113 04/06/2013   Lab Results  Component Value Date   CHOLHDL 2.4 03/16/2019   CHOLHDL 2.8 02/21/2015   CHOLHDL 4.0 04/06/2013   No results found for: LDLDIRECT    Radiology: No results found.  EKG: 02/01/20 SR LBBB no old to compare   ASSESSMENT AND PLAN:   1. Chest  Pain: atypical in setting of LBBB Echo with normal EF no RWMAls f/u lexiscan myovue  2. LBBB: new diagnosis in June no high grade AV block echo with no structural heart disease yearly echo see #1  3. HTN:  BP low quinipril dose decreased in June   F/u in a year   Signed: Charlton Haws 04/05/2020, 2:21 PM

## 2020-04-04 ENCOUNTER — Other Ambulatory Visit: Payer: Self-pay | Admitting: Neurology

## 2020-04-05 ENCOUNTER — Ambulatory Visit: Payer: Medicare Other | Admitting: Cardiovascular Disease

## 2020-04-05 ENCOUNTER — Other Ambulatory Visit: Payer: Self-pay

## 2020-04-05 ENCOUNTER — Encounter: Payer: Self-pay | Admitting: Cardiovascular Disease

## 2020-04-05 VITALS — BP 110/70 | HR 86 | Ht 60.0 in | Wt 162.6 lb

## 2020-04-05 DIAGNOSIS — I10 Essential (primary) hypertension: Secondary | ICD-10-CM

## 2020-04-05 DIAGNOSIS — R079 Chest pain, unspecified: Secondary | ICD-10-CM

## 2020-04-05 DIAGNOSIS — I447 Left bundle-branch block, unspecified: Secondary | ICD-10-CM

## 2020-04-05 NOTE — Patient Instructions (Signed)
Medication Instructions:  *If you need a refill on your cardiac medications before your next appointment, please call your pharmacy*  Lab Work: If you have labs (blood work) drawn today and your tests are completely normal, you will receive your results only by: Marland Kitchen MyChart Message (if you have MyChart) OR . A paper copy in the mail If you have any lab test that is abnormal or we need to change your treatment, we will call you to review the results.  Testing/Procedures: Your physician has requested that you have a lexiscan myoview. For further information please visit [email or web address] Please follow instruction sheet, as given.  Follow-Up: At [facility], you and your health needs are our priority.  As part of our continuing mission to provide you with exceptional heart care, we have created designated Provider Care Teams.  These Care Teams include your primary Cardiologist (physician) and Advanced Practice Providers (APPs -  Physician Assistants and Nurse Practitioners) who all work together to provide you with the care you need, when you need it.  We recommend signing up for the patient portal called "MyChart".  Sign up information is provided on this After Visit Summary.  MyChart is used to connect with patients for Virtual Visits (Telemedicine).  Patients are able to view lab/test results, encounter notes, upcoming appointments, etc.  Non-urgent messages can be sent to your provider as well.   To learn more about what you can do with MyChart, go to [email or web address].    Your next appointment:   1 year(s)  The format for your next appointment:   In Person  Provider:   You may see Dr. Eden Emms or one of the following Advanced Practice Providers on your designated Care Team:    Norma Fredrickson, NP  Nada Boozer, NP  Georgie Chard, NP

## 2020-04-09 ENCOUNTER — Other Ambulatory Visit: Payer: Self-pay

## 2020-04-09 ENCOUNTER — Ambulatory Visit (HOSPITAL_COMMUNITY): Payer: Medicare Other | Attending: Cardiology

## 2020-04-09 DIAGNOSIS — I447 Left bundle-branch block, unspecified: Secondary | ICD-10-CM | POA: Insufficient documentation

## 2020-04-09 DIAGNOSIS — I10 Essential (primary) hypertension: Secondary | ICD-10-CM | POA: Insufficient documentation

## 2020-04-09 DIAGNOSIS — R079 Chest pain, unspecified: Secondary | ICD-10-CM | POA: Insufficient documentation

## 2020-04-09 LAB — MYOCARDIAL PERFUSION IMAGING
LV dias vol: 38 mL (ref 46–106)
LV sys vol: 7 mL
Peak HR: 92 {beats}/min
Rest HR: 64 {beats}/min
SDS: 0
SRS: 2
SSS: 2
TID: 0.93

## 2020-04-09 MED ORDER — REGADENOSON 0.4 MG/5ML IV SOLN
0.4000 mg | Freq: Once | INTRAVENOUS | Status: AC
  Administered 2020-04-09: 0.4 mg via INTRAVENOUS

## 2020-04-09 MED ORDER — TECHNETIUM TC 99M TETROFOSMIN IV KIT
10.1000 | PACK | Freq: Once | INTRAVENOUS | Status: AC | PRN
  Administered 2020-04-09: 10.1 via INTRAVENOUS
  Filled 2020-04-09: qty 11

## 2020-04-09 MED ORDER — TECHNETIUM TC 99M TETROFOSMIN IV KIT
30.6000 | PACK | Freq: Once | INTRAVENOUS | Status: AC | PRN
  Administered 2020-04-09: 30.6 via INTRAVENOUS
  Filled 2020-04-09: qty 31

## 2020-04-10 ENCOUNTER — Other Ambulatory Visit: Payer: Self-pay | Admitting: Internal Medicine

## 2020-04-10 DIAGNOSIS — I10 Essential (primary) hypertension: Secondary | ICD-10-CM

## 2020-04-17 ENCOUNTER — Ambulatory Visit: Payer: Medicare Other | Admitting: Neurology

## 2020-04-17 ENCOUNTER — Encounter: Payer: Self-pay | Admitting: Neurology

## 2020-04-17 VITALS — BP 131/72 | HR 74 | Ht 60.0 in | Wt 165.0 lb

## 2020-04-17 DIAGNOSIS — R413 Other amnesia: Secondary | ICD-10-CM

## 2020-04-17 DIAGNOSIS — G249 Dystonia, unspecified: Secondary | ICD-10-CM | POA: Diagnosis not present

## 2020-04-17 DIAGNOSIS — G20 Parkinson's disease: Secondary | ICD-10-CM

## 2020-04-17 DIAGNOSIS — R443 Hallucinations, unspecified: Secondary | ICD-10-CM

## 2020-04-17 MED ORDER — DONEPEZIL HCL 10 MG PO TABS
10.0000 mg | ORAL_TABLET | Freq: Every day | ORAL | 3 refills | Status: DC
Start: 2020-04-17 — End: 2021-01-01

## 2020-04-17 NOTE — Progress Notes (Signed)
Reason for visit: Parkinson's disease, memory disorder  Christina Stevenson is an 74 y.o. female  History of present illness:  Christina Stevenson is a 74 year old right-handed white female with a history of Parkinson's disease.  The patient in the past has had problems with hallucinations but this is no longer an issue for her.  She takes low-dose Seroquel at night.  She does have a REM sleep disorder and she will sometimes act out her dreams, she takes 2.5 of the 0.5 mg tablets of clonazepam at night.  This makes her somewhat sluggish in the morning, she usually tries to get up around 7 AM but does not really function well until 10 AM.  She has dyskinesias associated with her Parkinson's disease but this does not interfere with her ability to walk.  She is on Sinemet taking the 25/100 mg tablets, she takes 2 tablets 3 times daily.  She is on low-dose Requip.  She is having ongoing problems with memory and confusion.  She lives at home with her husband.  She is somewhat limited in how far she can walk because of shortness of breath.  She can walk about one quarter of a mile.  The patient has not had any falls, she does not use a cane for ambulation.  Past Medical History:  Diagnosis Date  . Allergic rhinitis 09/16/2006   Qualifier: Diagnosis of  By: Ardyth Harps MD, Minerva Ends    . Allergic rhinitis, cause unspecified   . Anxiety   . BACK PAIN 07/09/2010   Qualifier: Diagnosis of  By: Coralee Pesa MD, Levada Schilling   . Bundle branch block, unspecified   . Cervical spondylosis without myelopathy 08/08/2015  . Depression 08/2009   multiple stressors: including son who has suffered 2 CVA's and is in a rehab facility, financial difficulties, loss of pets, etc.  . Depression   . Dermatitis   . GERD (gastroesophageal reflux disease)    on bid omeprazole  . Heart palpitations 12/24/2011  . HERPES ZOSTER 09/11/2009   Qualifier: Diagnosis of  By: Coralee Pesa MD, Levada Schilling   . History of shingles   . Hot flashes 12/24/2011  .  Hyperlipemia   . Hypertension   . Irritable bowel syndrome   . Memory disturbance   . Nocturnal leg cramps 11/19/2016  . 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 Nummular dermatitis 11/24/2017   Treated with clobetasol ointment 0.05% BID as needed  . Obesity   . Osteoarthrosis, unspecified whether generalized or localized, unspecified site   . Parkinson disease (HCC) 06/02/12   Probable; being follwed by Dr. Anne Hahn of [facility], has been started on Requip  . Pins and needles sensation 02/22/2015  . REM sleep behavior disorder   . 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 Sciatica of right side   . Spinal stenosis in cervical region   . Uterovaginal prolapse, incomplete    with cystocele    Past Surgical History:  Procedure Laterality Date  . ABDOMINAL HYSTERECTOMY    . bladder resuspension procedure    . CERVICAL LAMINECTOMY    . LUMBAR LAMINECTOMY    . Trigger finger surgery Right    Thumb    Family History  Problem Relation Age of Onset  . Heart Problems Father        lived to be 36 yo  . Cancer - Lung Sister     Social history:  reports that she has never smoked. She has never used smokeless tobacco. She reports that she does not  drink alcohol and does not use drugs.    Allergies  Allergen Reactions  . Penicillins Hives    Has patient had a PCN reaction causing immediate rash, facial/tongue/throat swelling, SOB or lightheadedness with hypotension: Yes Has patient had a PCN reaction causing severe rash involving mucus membranes or skin necrosis: No Has patient had a PCN reaction that required hospitalization: No Has patient had a PCN reaction occurring within the last 10 years: No If all of the above answers are "NO", then may proceed with Cephalosporin use. *per patient has tolerated keflex before   . Prednisone     Swelling, emotionally volatile   . Latex Rash    Medications:  Prior to Admission medications   Medication Sig Start Date End Date Taking? Authorizing Provider    acetaminophen (TYLENOL) 500 MG tablet Take 1,000 mg by mouth as needed.   Yes [provider]  atorvastatin (LIPITOR) 20 MG tablet TAKE 1 TABLET BY MOUTH  DAILY 11/04/19  Yes Seawell, Jaimie A, DO  carbidopa-levodopa (SINEMET IR) 25-100 MG tablet Take 2 tablets by mouth 3 (three) times daily. 11/01/19  Yes York Spaniel, MD  clobetasol ointment (TEMOVATE) 0.05 % APPLY TO AFFECTED AREA(S)  TOPICALLY TWO TIMES DAILY 03/03/18  Yes Tyson Alias, MD  clonazePAM (KLONOPIN) 0.5 MG tablet TAKE 3 TABLETS BY MOUTH AT  BEDTIME 04/04/20  Yes York Spaniel, MD  diclofenac Sodium (VOLTAREN) 1 % GEL Apply 4 g topically 4 (four) times daily. 02/01/20  Yes Seawell, Jaimie A, DO  donepezil (ARICEPT) 5 MG tablet TAKE 1 TABLET(5 MG) BY MOUTH AT BEDTIME 03/15/20  Yes York Spaniel, MD  ibuprofen (ADVIL,MOTRIN) 200 MG tablet Take 200 mg by mouth every 6 (six) hours as needed for mild pain.    Yes [provider]  pantoprazole (PROTONIX) 20 MG tablet TAKE 1 TABLET BY MOUTH  TWICE DAILY 02/21/20  Yes Dellia Cloud, MD  QUEtiapine (SEROQUEL) 25 MG tablet 1/2 tablet twice a day 09/23/19  Yes York Spaniel, MD  quinapril (ACCUPRIL) 20 MG tablet Take 0.5 tablets (10 mg total) by mouth daily. 02/01/20  Yes Seawell, Jaimie A, DO  rOPINIRole (REQUIP) 1 MG tablet TAKE 1 TABLET BY MOUTH 3  TIMES DAILY 11/03/19  Yes York Spaniel, MD  sertraline (ZOLOFT) 50 MG tablet TAKE 1 TABLET BY MOUTH  DAILY 08/09/19  Yes York Spaniel, MD    ROS:  Out of a complete 14 system review of symptoms, the patient complains only of the following symptoms, and all other reviewed systems are negative.  Shortness of breath Memory problems Walking difficulty  Blood pressure 131/72, pulse 74, height 5' (1.524 m), weight 165 lb (74.8 kg).  Physical Exam  General: The patient is alert and cooperative at the time of the examination.  The patient is moderately obese.  Skin: No significant peripheral edema  is noted.   Neurologic Exam  Mental status: The patient is alert and oriented x 3 at the time of the examination. The Mini-Mental status examination done today shows a total score of 17/30.   Cranial nerves: Facial symmetry is present. Speech is normal, no aphasia or dysarthria is noted. Extraocular movements are full. Visual fields are full.  Motor: The patient has good strength in all 4 extremities.  Sensory examination: Soft touch sensation is symmetric on the face, arms, and legs.  Coordination: The patient has good finger-nose-finger and heel-to-shin bilaterally.  Dyskinesias involving the axial muscles are noted.  Gait and  station: The patient has the ability to arise from a seated position with arms crossed.  Once up, she can walk independently, she has fairly good stride and turns.  Tandem gait was not attempted.  Romberg is negative.  Reflexes: Deep tendon reflexes are symmetric.   Assessment/Plan:  1.  Parkinson's disease  2.  Dementia  3.  REM sleep disorder  4.  History of hallucinations  The patient is no longer having hallucinations but she does have some issues with acting out her dreams.  The patient is having worsening problems with memory and confusion.  The Aricept dose will be increased to 10 mg daily, look out for increased vivid dreams.  The patient will continue her current dose of Sinemet, she will follow up here in 5 months.  She is to remain as active as possible.  She was seen through cardiology for her shortness of breath, a stress test was unremarkable.  Marlan Palau MD 04/17/2020 10:19 AM  [facility] [address] [place], [place] [address]  Phone [phone] Fax [phone]

## 2020-04-17 NOTE — Patient Instructions (Signed)
We will go up on the aricept to 10 mg a day.

## 2020-05-03 ENCOUNTER — Telehealth: Payer: Self-pay | Admitting: Neurology

## 2020-05-03 NOTE — Telephone Encounter (Signed)
I called and left a message.  Okay for a handicap sticker for the car.  I will get this filled out tomorrow.

## 2020-05-03 NOTE — Telephone Encounter (Signed)
Pt's husband, Jerrine Urschel called mobility getting worse and can't walk too good. Can Dr. Anne Hahn get a handicap sticker for the car? Would like a call from the nurse.

## 2020-05-10 ENCOUNTER — Other Ambulatory Visit: Payer: Self-pay

## 2020-05-10 ENCOUNTER — Encounter: Payer: Self-pay | Admitting: Internal Medicine

## 2020-05-10 ENCOUNTER — Telehealth: Payer: Self-pay

## 2020-05-10 ENCOUNTER — Ambulatory Visit (INDEPENDENT_AMBULATORY_CARE_PROVIDER_SITE_OTHER): Payer: Medicare Other | Admitting: Internal Medicine

## 2020-05-10 VITALS — BP 102/62 | HR 65 | Temp 98.4°F | Wt 163.1 lb

## 2020-05-10 DIAGNOSIS — Z23 Encounter for immunization: Secondary | ICD-10-CM | POA: Diagnosis not present

## 2020-05-10 DIAGNOSIS — E860 Dehydration: Secondary | ICD-10-CM | POA: Diagnosis not present

## 2020-05-10 DIAGNOSIS — R448 Other symptoms and signs involving general sensations and perceptions: Secondary | ICD-10-CM

## 2020-05-10 DIAGNOSIS — R350 Frequency of micturition: Secondary | ICD-10-CM | POA: Diagnosis not present

## 2020-05-10 DIAGNOSIS — N390 Urinary tract infection, site not specified: Secondary | ICD-10-CM

## 2020-05-10 DIAGNOSIS — N3000 Acute cystitis without hematuria: Secondary | ICD-10-CM

## 2020-05-10 DIAGNOSIS — R0989 Other specified symptoms and signs involving the circulatory and respiratory systems: Secondary | ICD-10-CM

## 2020-05-10 LAB — POCT URINALYSIS DIPSTICK
Bilirubin, UA: NEGATIVE
Blood, UA: NEGATIVE
Glucose, UA: NEGATIVE
Nitrite, UA: POSITIVE
Protein, UA: NEGATIVE
Spec Grav, UA: 1.025 (ref 1.010–1.025)
Urobilinogen, UA: 0.2 E.U./dL
pH, UA: 5 (ref 5.0–8.0)

## 2020-05-10 MED ORDER — SULFAMETHOXAZOLE-TRIMETHOPRIM 800-160 MG PO TABS
1.0000 | ORAL_TABLET | Freq: Two times a day (BID) | ORAL | 0 refills | Status: AC
Start: 2020-05-10 — End: 2020-05-13

## 2020-05-10 NOTE — Telephone Encounter (Signed)
Pt's husband is calling back to speak a nurse. Please call pt back.

## 2020-05-10 NOTE — Telephone Encounter (Signed)
Patient's husband called back. Appt given today at 3:45 for possible UTI. Kinnie Feil, BSN, RN-BC

## 2020-05-10 NOTE — Telephone Encounter (Signed)
Pt's husband requesting to speak with a nurse about getting an appt for today. States pt might have bladder infection and not eating. Please call back.

## 2020-05-10 NOTE — Telephone Encounter (Signed)
TC, VM obtained and LM to call back for triage nurse. SChaplin, RN,BSN

## 2020-05-10 NOTE — Patient Instructions (Addendum)
For your urinary symptoms, I have sent in a prescription for an antibiotic. You will take it for three days. I will also reach out to Dr. Anne Hahn to discuss your swallowing. I have also placed an order for a study to evaluate your swallowing. We will call you to arrange an appointment. I have also placed a referral to speech therapy.

## 2020-05-11 ENCOUNTER — Other Ambulatory Visit (HOSPITAL_COMMUNITY): Payer: Self-pay

## 2020-05-11 DIAGNOSIS — R1310 Dysphagia, unspecified: Secondary | ICD-10-CM

## 2020-05-11 LAB — CBC
Hematocrit: 35.6 % (ref 34.0–46.6)
Hemoglobin: 12 g/dL (ref 11.1–15.9)
MCH: 29.7 pg (ref 26.6–33.0)
MCHC: 33.7 g/dL (ref 31.5–35.7)
MCV: 88 fL (ref 79–97)
Platelets: 222 10*3/uL (ref 150–450)
RBC: 4.04 x10E6/uL (ref 3.77–5.28)
RDW: 12.6 % (ref 11.7–15.4)
WBC: 6.7 10*3/uL (ref 3.4–10.8)

## 2020-05-11 LAB — BASIC METABOLIC PANEL
BUN/Creatinine Ratio: 21 (ref 12–28)
BUN: 18 mg/dL (ref 8–27)
CO2: 26 mmol/L (ref 20–29)
Calcium: 9.6 mg/dL (ref 8.7–10.3)
Chloride: 102 mmol/L (ref 96–106)
Creatinine, Ser: 0.85 mg/dL (ref 0.57–1.00)
GFR calc Af Amer: 79 mL/min/{1.73_m2} (ref >59)
GFR calc non Af Amer: 68 mL/min/{1.73_m2} (ref >59)
Glucose: 149 mg/dL — ABNORMAL HIGH (ref 65–99)
Potassium: 4.3 mmol/L (ref 3.5–5.2)
Sodium: 141 mmol/L (ref 134–144)

## 2020-05-13 DIAGNOSIS — R0989 Other specified symptoms and signs involving the circulatory and respiratory systems: Secondary | ICD-10-CM | POA: Insufficient documentation

## 2020-05-13 NOTE — Assessment & Plan Note (Addendum)
Pt presenting with difficulty swallowing over the past 2 months.  No significant findings on exam. No drooling or excess oral secretions. Plan --will try to touch base with her primary neurologist to discuss the possible relation to Parkinson's disease --swallow study ordered to r/o mechanical obstruction or peristalsis issue --referral placed to speech  --f/u after completing swallow studying  Addendum 05/15/20 Swallow study report: Overall, her oropharyngeal swallow mechanism was within functional limits. No instances of aspiration were noted; however, liquids were frequently held at the level of the laryngopharynx which could potentially increase aspiration risk. Pt expressed that she has been consuming soft solids due to her reduced dentition; however, considering her oropharyngeal swallow mechanism, she may have up to regular texture solids if she wishes. Esophageal screening revealed reduced transit of the barium tablet through the upper thoracic esophagus. Additional boluses were needed for transit of the tablet past the valleculae and into the stomach. Backflow of liquid barium was noted from the lower thoracic esophagus to the mid thoracic esophagus. A dedicated esophageal assessment may be conducted to determine etiology.  Plan: will order esophogram for further evaluation

## 2020-05-13 NOTE — Progress Notes (Signed)
Office Visit   Patient ID: Christina Stevenson, female    DOB: [birth year], 74 y.o.   MRN: [id number]  Subjective:  CC: bladder infection, swallowing problem  HPI 74 y.o. presents today for 2 month history of urinary hesitancy. She is accompanied by her husband who contributes to portions of the history.   Pt notes that she will feel like she needs to urinate but then is unable to do so. Mild dysuria. Denies fevers, chills, abdominal or back pain. No abnormal discharge. Her husband notes that she had similar symptoms in the past resulting in hospital admission for urosepsis.   Pt also notes difficulty swallowing for the past couple of months. She feels as if something is stuck in the back of her throat. She has had decreased oral intake over this time as well. She denies throat pain with swallowing. No recent illnesses. No similar episodes in the past. No prior evaluation.      ACTIVE MEDICATIONS   Current Outpatient Medications on File Prior to Visit  Medication Sig Dispense Refill  . acetaminophen (TYLENOL) 500 MG tablet Take 1,000 mg by mouth as needed.    Marland Kitchen atorvastatin (LIPITOR) 20 MG tablet TAKE 1 TABLET BY MOUTH  DAILY 90 tablet 3  . carbidopa-levodopa (SINEMET IR) 25-100 MG tablet Take 2 tablets by mouth 3 (three) times daily. 540 tablet 2  . clobetasol ointment (TEMOVATE) 0.05 % APPLY TO AFFECTED AREA(S)  TOPICALLY TWO TIMES DAILY 30 g 0  . clonazePAM (KLONOPIN) 0.5 MG tablet TAKE 3 TABLETS BY MOUTH AT  BEDTIME 270 tablet 1  . diclofenac Sodium (VOLTAREN) 1 % GEL Apply 4 g topically 4 (four) times daily. 4 g 0  . donepezil (ARICEPT) 10 MG tablet Take 1 tablet (10 mg total) by mouth at bedtime. 90 tablet 3  . ibuprofen (ADVIL,MOTRIN) 200 MG tablet Take 200 mg by mouth every 6 (six) hours as needed for mild pain.     . pantoprazole (PROTONIX) 20 MG tablet TAKE 1 TABLET BY MOUTH  TWICE DAILY 180 tablet 3  . QUEtiapine (SEROQUEL) 25 MG tablet 1/2 tablet twice a day 90 tablet 3  .  quinapril (ACCUPRIL) 20 MG tablet Take 0.5 tablets (10 mg total) by mouth daily. 90 tablet 3  . rOPINIRole (REQUIP) 1 MG tablet TAKE 1 TABLET BY MOUTH 3  TIMES DAILY 270 tablet 3  . sertraline (ZOLOFT) 50 MG tablet TAKE 1 TABLET BY MOUTH  DAILY 90 tablet 3   No current facility-administered medications on file prior to visit.    ROS  Review of Systems  Constitutional: Positive for appetite change. Negative for chills and fever.  HENT: Positive for trouble swallowing. Negative for mouth sores, postnasal drip, rhinorrhea, sore throat and voice change.   Respiratory: Negative for shortness of breath and stridor.   Gastrointestinal: Negative for abdominal pain, nausea and vomiting.  Genitourinary: Positive for difficulty urinating and dysuria. Negative for flank pain, frequency and urgency.    Objective:   BP 102/62 (BP Location: Left Arm, Patient Position: Sitting, Cuff Size: Normal)   Pulse 65   Temp 98.4 F (36.9 C) (Oral)   Wt 163 lb 1.6 oz (74 kg)   SpO2 98%   BMI 31.85 kg/m  Wt Readings from Last 3 Encounters:  05/10/20 163 lb 1.6 oz (74 kg)  04/17/20 165 lb (74.8 kg)  04/09/20 162 lb (73.5 kg)   BP Readings from Last 3 Encounters:  05/10/20 102/62  04/17/20 131/72  04/05/20 110/70  Physical Exam Vitals and nursing note reviewed.  Constitutional:      Appearance: Normal appearance.  HENT:     Mouth/Throat:     Pharynx: No posterior oropharyngeal erythema.     Comments: No oral ulcers or exudate. No tonsillar hypertrophy. Uvula appears normal. No drooling or excess oral secretions Neck:     Comments: No cervical adenopathy appreciated. Thyroid feels normal on exam. Cardiovascular:     Rate and Rhythm: Normal rate and regular rhythm.  Abdominal:     General: There is no distension.     Palpations: Abdomen is soft.     Tenderness: There is no abdominal tenderness. There is no right CVA tenderness or left CVA tenderness.     Health Maintenance:   Health  Maintenance  Topic Date Due  . Hepatitis C Screening  Never done  . MAMMOGRAM  12/23/2018  . LIPID PANEL  03/15/2020  . COLON CANCER SCREENING ANNUAL FOBT  04/22/2020  . TETANUS/TDAP  04/07/2023  . INFLUENZA VACCINE  Completed  . DEXA SCAN  Completed  . COVID-19 Vaccine  Completed  . PNA vac Low Risk Adult  Completed  . COLONOSCOPY  Discontinued     Assessment & Plan:   Problem List Items Addressed This Visit      Genitourinary   UTI (urinary tract infection)    Pt presenting with 65mo history of urinary hesitancy with mild dysuria.  No systemic signs in the clinic. No leukocytosis on cbc. UA +leukocytes and nitrites. UC with GNR. Plan:  --3d course of bactrim --return precautions discussed      Relevant Medications   sulfamethoxazole-trimethoprim (BACTRIM DS) 800-160 MG tablet     Other   Globus sensation    Pt presenting with difficulty swallowing over the past 2 months.  No significant findings on exam. No drooling or excess oral secretions. Plan --will try to touch base with her primary neurologist to discuss the possible relation to Parkinson's disease --swallow study ordered to r/o mechanical obstruction or peristalsis issue --referral placed to speech  --f/u after completing swallow studying      Relevant Orders   SLP modified barium swallow   Ambulatory referral to Speech Therapy    Other Visit Diagnoses    Urination frequency    -  Primary   Relevant Orders   POCT Urinalysis Dipstick (81002) (Completed)   CBC no Diff (Completed)   Culture, Urine (Completed)   Dehydration       Relevant Orders   Basic metabolic panel (Completed)   Need for immunization against influenza       Relevant Orders   Flu Vaccine QUAD 36+ mos IM (Completed)        Pt discussed with Dr. Fredrich Romans, MD Internal Medicine Resident PGY-2 [facility] Pager: [phone] 05/13/2020 3:40 AM

## 2020-05-13 NOTE — Assessment & Plan Note (Signed)
Pt presenting with 69mo history of urinary hesitancy with mild dysuria.  No systemic signs in the clinic. No leukocytosis on cbc. UA +leukocytes and nitrites. UC with GNR. Plan:  --3d course of bactrim --return precautions discussed

## 2020-05-14 ENCOUNTER — Other Ambulatory Visit: Payer: Self-pay

## 2020-05-14 ENCOUNTER — Ambulatory Visit (HOSPITAL_COMMUNITY)
Admission: RE | Admit: 2020-05-14 | Discharge: 2020-05-14 | Disposition: A | Discharge status: Home / Self Care | Payer: Medicare Other | Source: Ambulatory Visit | Attending: Internal Medicine | Admitting: Internal Medicine

## 2020-05-14 DIAGNOSIS — R0989 Other specified symptoms and signs involving the circulatory and respiratory systems: Secondary | ICD-10-CM | POA: Diagnosis not present

## 2020-05-14 DIAGNOSIS — R1310 Dysphagia, unspecified: Secondary | ICD-10-CM | POA: Insufficient documentation

## 2020-05-14 DIAGNOSIS — R05 Cough: Secondary | ICD-10-CM | POA: Diagnosis not present

## 2020-05-14 NOTE — Progress Notes (Signed)
DOS 05/10/20:  Internal Medicine Clinic Attending  Case discussed with Dr. Ephriam Knuckles  At the time of the visit.  We reviewed the resident's history and exam and pertinent patient test results.  I agree with the assessment, diagnosis, and plan of care documented in the resident's note. High likelihood the dysphagia is related to PD.

## 2020-05-14 NOTE — Progress Notes (Signed)
Modified Barium Swallow Progress Note  Patient Details  Name: Christina Stevenson MRN: [id number] Date of Birth: 06-29-1946  Today's Date: 05/14/2020  Modified Barium Swallow completed.  Full report located under Chart Review in the Imaging Section.  Brief recommendations include the following:  Clinical Impression  Pt was seen in radiology suite for modified barium swallow study. Trials of puree solids, regular texture solids, a 72mm barium tablet, and thin liquids via cup and straw were administered. Pt demonstrated transient penetration (PAS 2) of thin liquids and vallecular residue with large boluses which was cleared with secondary swallows. Overall, her oropharyngeal swallow mechanism was within functional limits. No instances of aspiration were noted; however, liquids were frequently held at the level of the laryngopharynx which could potentially increase aspiration risk. However, the pt appeared disgusted by the flavor the liquids and described them as "awful" so the impact of her willingness to consume the liquid is considered. Esophageal screening revealed reduced transit of the barium tablet through the upper thoracic esophagus. Additional boluses were needed for transit of the tablet past the valleculae and past the upper thoracic esophagus. Backflow of liquid barium was noted from the lower thoracic esophagus to the mid thoracic esophagus. Esophageal assessment (e.g., esophagram) may be conducted for evaluation of the esophageal and to determine etiology. Pt expressed that she has been consuming soft solids due to her reduced dentition; however, considering her oropharyngeal swallow mechanism, she may have up to regular texture solids if she wishes.    Swallow Evaluation Recommendations       SLP Diet Recommendations: Regular solids;Thin liquid;Dysphagia 3 (Mech soft) solids   Liquid Administration via: Cup;Straw   Medication Administration: Whole meds with puree   Supervision:  Patient able to self feed   Compensations: Slow rate;Small sips/bites   Postural Changes: Remain semi-upright after after feeds/meals (Comment)          Oretta Berkland I. Vear Clock, MS, CCC-SLP Acute Rehabilitation Services Office number [phone] Pager [phone]   Scheryl Marten 05/14/2020,12:34 PM

## 2020-05-16 ENCOUNTER — Other Ambulatory Visit: Payer: Self-pay | Admitting: Internal Medicine

## 2020-05-16 DIAGNOSIS — R0989 Other specified symptoms and signs involving the circulatory and respiratory systems: Secondary | ICD-10-CM

## 2020-05-16 LAB — URINE CULTURE

## 2020-05-16 NOTE — Addendum Note (Signed)
Addended by: Elige Radon on: 05/16/2020 08:50 AM   Modules accepted: Orders

## 2020-05-25 ENCOUNTER — Encounter: Payer: Self-pay | Admitting: Internal Medicine

## 2020-05-29 ENCOUNTER — Encounter: Payer: Self-pay | Admitting: Internal Medicine

## 2020-06-04 ENCOUNTER — Encounter: Payer: Self-pay | Admitting: *Deleted

## 2020-06-05 ENCOUNTER — Other Ambulatory Visit: Payer: Self-pay | Admitting: Internal Medicine

## 2020-06-05 DIAGNOSIS — K219 Gastro-esophageal reflux disease without esophagitis: Secondary | ICD-10-CM

## 2020-06-05 NOTE — Telephone Encounter (Signed)
Medication no longer on medication list-will send to appropriate team for review. Please advise.Christina Spittle Cassady10/19/202112:27 PM

## 2020-06-21 ENCOUNTER — Other Ambulatory Visit: Payer: Self-pay

## 2020-06-21 NOTE — Telephone Encounter (Signed)
Lm for rtc 

## 2020-06-21 NOTE — Telephone Encounter (Signed)
Pls pharmacy/insurance regarding medication [phone]

## 2020-06-22 ENCOUNTER — Other Ambulatory Visit: Payer: Self-pay | Admitting: Internal Medicine

## 2020-06-22 DIAGNOSIS — I10 Essential (primary) hypertension: Secondary | ICD-10-CM

## 2020-06-22 MED ORDER — QUINAPRIL HCL 20 MG PO TABS: 10.0000 mg | ORAL_TABLET | Freq: Every day | ORAL | 3 refills | Status: DC

## 2020-06-22 NOTE — Telephone Encounter (Signed)
Returned call to [facility]. No answer. Left message on VM requesting return call. Kinnie Feil, RN, BSN

## 2020-06-22 NOTE — Telephone Encounter (Signed)
Resent script. Thanks.

## 2020-06-22 NOTE — Telephone Encounter (Signed)
[facility] needs new script for quinapril, script is asking for 20mg  tablet but stating 1/2 of 10mg  tablet, please send a new script ASAP

## 2020-06-22 NOTE — Telephone Encounter (Signed)
  Pls contact pharmacy [phone]

## 2020-06-25 ENCOUNTER — Other Ambulatory Visit: Payer: Self-pay | Admitting: Neurology

## 2020-06-25 MED ORDER — CARBIDOPA-LEVODOPA 25-100 MG PO TABS: ORAL_TABLET | ORAL | 2 refills | Status: DC

## 2020-06-27 ENCOUNTER — Other Ambulatory Visit: Payer: Self-pay | Admitting: Neurology

## 2020-06-28 ENCOUNTER — Other Ambulatory Visit: Payer: Self-pay | Admitting: *Deleted

## 2020-06-28 MED ORDER — CARBIDOPA-LEVODOPA 25-100 MG PO TABS: ORAL_TABLET | ORAL | 2 refills | Status: DC

## 2020-06-30 ENCOUNTER — Ambulatory Visit: Payer: Medicare Other | Attending: Internal Medicine

## 2020-06-30 DIAGNOSIS — Z23 Encounter for immunization: Secondary | ICD-10-CM

## 2020-06-30 NOTE — Progress Notes (Signed)
   Covid-19 Vaccination Clinic  Name:  ELLANIE OPPEDISANO    MRN: [id number] DOB: February 19, 1946  06/30/2020  Ms. Sandy was observed post Covid-19 immunization for 15 minutes without incident. She was provided with Vaccine Information Sheet and instruction to access the V-Safe system.   Ms. Manera was instructed to call 911 with any severe reactions post vaccine: Marland Kitchen Difficulty breathing  . Swelling of face and throat  . A fast heartbeat  . A bad rash all over body  . Dizziness and weakness   Immunizations Administered    Name Date Dose VIS Date Route   Pfizer COVID-19 Vaccine 06/30/2020 12:57 PM 0.3 mL 06/06/2020 Intramuscular   Manufacturer: [employer], [employer]   Lot: [id number]   NDC: [address]-1

## 2020-07-04 ENCOUNTER — Other Ambulatory Visit: Payer: Self-pay | Admitting: Neurology

## 2020-07-16 NOTE — Addendum Note (Signed)
Addended by: Neomia Dear on: 07/16/2020 04:30 PM   Modules accepted: Orders

## 2020-09-27 ENCOUNTER — Other Ambulatory Visit: Payer: Self-pay | Admitting: Internal Medicine

## 2020-09-27 ENCOUNTER — Other Ambulatory Visit: Payer: Self-pay | Admitting: Neurology

## 2020-09-27 DIAGNOSIS — E785 Hyperlipidemia, unspecified: Secondary | ICD-10-CM

## 2020-10-11 ENCOUNTER — Other Ambulatory Visit: Payer: Self-pay | Admitting: Neurology

## 2020-10-15 ENCOUNTER — Ambulatory Visit: Payer: Medicare Other | Admitting: Neurology

## 2020-10-15 ENCOUNTER — Encounter: Payer: Self-pay | Admitting: Neurology

## 2020-10-15 VITALS — Ht 63.0 in | Wt 154.6 lb

## 2020-10-15 DIAGNOSIS — R269 Unspecified abnormalities of gait and mobility: Secondary | ICD-10-CM | POA: Diagnosis not present

## 2020-10-15 DIAGNOSIS — R413 Other amnesia: Secondary | ICD-10-CM | POA: Diagnosis not present

## 2020-10-15 DIAGNOSIS — G249 Dystonia, unspecified: Secondary | ICD-10-CM

## 2020-10-15 DIAGNOSIS — G20 Parkinson's disease: Secondary | ICD-10-CM | POA: Diagnosis not present

## 2020-10-15 MED ORDER — CARBIDOPA-LEVODOPA ER 25-100 MG PO TBCR: 1.0000 | EXTENDED_RELEASE_TABLET | Freq: Every day | ORAL | 1 refills | Status: DC

## 2020-10-15 MED ORDER — LORAZEPAM 0.5 MG PO TABS: 1.0000 mg | ORAL_TABLET | Freq: Every day | ORAL | 3 refills | Status: DC

## 2020-10-15 NOTE — Patient Instructions (Signed)
We will add Sinemet CR 25/100 mg tablet at night. We will stop the clonazepam and start Ativan 0.5 mg tablet, take 2 at night.  Sinemet (carbidopa) may result in confusion or hallucinations, drowsiness, nausea, or dizziness. If any significant side effects are noted, please contact our office. Sinemet may not be well absorbed when taken with high protein meals, if tolerated it is best to take 30-45 minutes before you eat.

## 2020-10-15 NOTE — Progress Notes (Signed)
Reason for visit: Parkinson's disease, memory disturbance, gait disturbance  Christina Stevenson is an 75 y.o. female  History of present illness:  Christina Stevenson is a 75 year old right-handed white female with a history of Parkinson's disease.  The patient does have some dyskinesias associated with this that she claims do not interfere with her ability to walk.  She is having some increased problems with balance however, she will have a tendency to fall, usually when she is not using her cane.  She feels that she is limited on how far she can walk.  She has more troubles with her Parkinson's disease in the morning, she has more of a tendency to freeze and more of a tendency to fall.  She gets her Sinemet dosing at 7:30 AM, 12 noon, and around 5:30 PM.  She is not having a lot of troubles with hallucinations at this point.  She will have a tendency to wake up around 3 AM and cannot get back to sleep.  She is on clonazepam taking 1 mg at night for a REM sleep disorder.  The patient will still talk in her sleep but she does not have a lot of physical activity otherwise.  She denies any problems with swallowing or choking.  The memory remains an issue, she does not operate a motor vehicle.  She is on Aricept 10 mg at night.  Past Medical History:  Diagnosis Date  . Allergic rhinitis 09/16/2006   Qualifier: Diagnosis of  By: Ardyth Harps MD, Minerva Ends    . Allergic rhinitis, cause unspecified   . Anxiety   . BACK PAIN 07/09/2010   Qualifier: Diagnosis of  By: Coralee Pesa MD, Levada Schilling   . Bundle branch block, unspecified   . Cervical spondylosis without myelopathy 08/08/2015  . Depression 08/2009   multiple stressors: including son who has suffered 2 CVA's and is in a rehab facility, financial difficulties, loss of pets, etc.  . Depression   . Dermatitis   . GERD (gastroesophageal reflux disease)    on bid omeprazole  . Heart palpitations 12/24/2011  . HERPES ZOSTER 09/11/2009   Qualifier: Diagnosis of  By:  Coralee Pesa MD, Levada Schilling   . History of shingles   . Hot flashes 12/24/2011  . Hyperlipemia   . Hypertension   . Irritable bowel syndrome   . Memory disturbance   . Nocturnal leg cramps 11/19/2016  . 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 Nummular dermatitis 11/24/2017   Treated with clobetasol ointment 0.05% BID as needed  . Obesity   . Osteoarthrosis, unspecified whether generalized or localized, unspecified site   . Parkinson disease (HCC) 06/02/12   Probable; being follwed by Dr. Anne Hahn of [facility], has been started on Requip  . Pins and needles sensation 02/22/2015  . REM sleep behavior disorder   . 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 Sciatica of right side   . Spinal stenosis in cervical region   . Uterovaginal prolapse, incomplete    with cystocele    Past Surgical History:  Procedure Laterality Date  . ABDOMINAL HYSTERECTOMY    . bladder resuspension procedure    . CERVICAL LAMINECTOMY    . LUMBAR LAMINECTOMY    . Trigger finger surgery Right    Thumb    Family History  Problem Relation Age of Onset  . Heart Problems Father        lived to be 55 yo  . Cancer - Lung Sister     Social history:  reports  that she has never smoked. She has never used smokeless tobacco. She reports that she does not drink alcohol and does not use drugs.    Allergies  Allergen Reactions  . Penicillins Hives    Has patient had a PCN reaction causing immediate rash, facial/tongue/throat swelling, SOB or lightheadedness with hypotension: Yes Has patient had a PCN reaction causing severe rash involving mucus membranes or skin necrosis: No Has patient had a PCN reaction that required hospitalization: No Has patient had a PCN reaction occurring within the last 10 years: No If all of the above answers are "NO", then may proceed with Cephalosporin use. *per patient has tolerated keflex before   . Prednisone     Swelling, emotionally volatile   . Latex Rash    Medications:  Prior to Admission  medications   Medication Sig Start Date End Date Taking? Authorizing Provider  acetaminophen (TYLENOL) 500 MG tablet Take 1,000 mg by mouth as needed.   Yes [provider]  atorvastatin (LIPITOR) 20 MG tablet TAKE 1 TABLET BY MOUTH  DAILY 09/28/20  Yes Roylene Reason, MD  carbidopa-levodopa (SINEMET IR) 25-100 MG tablet 2 tablets in the morning, 1.5 tablets at midday and in the evening 06/28/20  Yes York Spaniel, MD  clobetasol ointment (TEMOVATE) 0.05 % APPLY TO AFFECTED AREA(S)  TOPICALLY TWO TIMES DAILY 03/03/18  Yes Tyson Alias, MD  clonazePAM (KLONOPIN) 0.5 MG tablet TAKE 3 TABLETS BY MOUTH AT  BEDTIME 04/04/20  Yes York Spaniel, MD  diclofenac Sodium (VOLTAREN) 1 % GEL Apply 4 g topically 4 (four) times daily. 02/01/20  Yes Seawell, Jaimie A, DO  donepezil (ARICEPT) 10 MG tablet Take 1 tablet (10 mg total) by mouth at bedtime. 04/17/20  Yes York Spaniel, MD  ibuprofen (ADVIL,MOTRIN) 200 MG tablet Take 200 mg by mouth every 6 (six) hours as needed for mild pain.    Yes [provider]  pantoprazole (PROTONIX) 20 MG tablet TAKE 1 TABLET BY MOUTH  TWICE DAILY 02/21/20  Yes Dellia Cloud, MD  QUEtiapine (SEROQUEL) 25 MG tablet TAKE ONE-HALF TABLET BY  MOUTH TWICE DAILY 07/05/20  Yes York Spaniel, MD  quinapril (ACCUPRIL) 20 MG tablet Take 0.5 tablets (10 mg total) by mouth daily. 06/22/20  Yes Carlynn Purl C, DO  rOPINIRole (REQUIP) 1 MG tablet TAKE 1 TABLET BY MOUTH 3  TIMES DAILY 10/15/20  Yes York Spaniel, MD  sertraline (ZOLOFT) 50 MG tablet TAKE 1 TABLET BY MOUTH  DAILY 07/05/20  Yes York Spaniel, MD    ROS:  Out of a complete 14 system review of symptoms, the patient complains only of the following symptoms, and all other reviewed systems are negative.  Walking difficulty Memory problems Falls  Height 5\' 3"  (1.6 m), weight 154 lb 9.6 oz (70.1 kg).  Physical Exam  General: The patient is alert and cooperative at the time of the  examination.  The patient is moderately obese.  Skin: No significant peripheral edema is noted.   Neurologic Exam  Mental status: The patient is alert and oriented x 3 at the time of the examination. The Mini-Mental status examination done today shows a total score of 26/30.   Cranial nerves: Facial symmetry is present. Speech is normal, no aphasia or dysarthria is noted. Extraocular movements are full. Visual fields are full.  Mild masking the face is seen.  Motor: The patient has good strength in all 4 extremities.  Sensory examination: Soft touch sensation is symmetric  on the face, arms, and legs.  Coordination: The patient has good finger-nose-finger and heel-to-shin bilaterally.  Gait and station: The patient is able to ambulate fairly well, she normally uses a cane.  She has some slight decreased arm swing bilaterally.  She is remarkably able to perform tandem gait.  Romberg is negative.  Dyskinesias are seen.  Reflexes: Deep tendon reflexes are symmetric.   Assessment/Plan:  1.  Parkinson's disease  2.  Memory disturbance  3.  Gait disturbance  The patient is having increased problems with freezing in the morning, we will give her a 25/100 mg CR Sinemet tablet in the evening, we may need to go up on this dose.  She will undergo some physical therapy.  We will stop the clonazepam and start lorazepam to see if this helps her sleep issues better.  The patient is on 1 mg of clonazepam, higher doses resulted in drowsiness during the day following the dose.  She will follow up here in 5 months.  We will follow the memory issues over time.  Marlan Palau MD 10/15/2020 6:40 PM  [facility] [address] [place], [place] [address]  Phone [phone] Fax [phone]

## 2020-10-17 ENCOUNTER — Telehealth: Payer: Self-pay | Admitting: Neurology

## 2020-10-17 MED ORDER — CARBIDOPA-LEVODOPA ER 25-100 MG PO TBCR: 1.0000 | EXTENDED_RELEASE_TABLET | Freq: Every day | ORAL | 1 refills | Status: DC

## 2020-10-17 MED ORDER — LORAZEPAM 0.5 MG PO TABS
1.0000 mg | ORAL_TABLET | Freq: Every day | ORAL | 3 refills | Status: DC
Start: 2020-10-17 — End: 2020-12-13

## 2020-10-17 NOTE — Telephone Encounter (Signed)
I will resend the prescriptions.

## 2020-10-17 NOTE — Addendum Note (Signed)
Addended by: York Spaniel on: 10/17/2020 05:53 PM   Modules accepted: Orders

## 2020-10-17 NOTE — Telephone Encounter (Signed)
Pt.'s husband Greggory Stallion is on [place]. He states pharmacy where recent medications were sent, their system has been hacked. He is requesting that Carbidopa-Levodopa ER (SINEMET CR) 25-100 MG tablet controlled release & LORazepam (ATIVAN) 0.5 MG tablet be sent to [facility] on [address]. [place], [place]

## 2020-11-27 ENCOUNTER — Ambulatory Visit: Payer: Medicare Other | Admitting: Student

## 2020-12-10 ENCOUNTER — Telehealth: Payer: Self-pay

## 2020-12-10 NOTE — Telephone Encounter (Signed)
Pt's husband left a voicemail asking for a call to discusss the pt. He cal be reached at [phone].

## 2020-12-11 NOTE — Telephone Encounter (Signed)
Returned patient's husband, he said her parkinsons is getting real bad.  She is no longer able to dress and feed herself and she is having difficulty walking.    Requested urgent appointment, was able to offer patient 4/28 @ 1030 to arrive at 1000, accepted.  Patient denied further questions, verbalized understanding and expressed appreciation for the phone call.

## 2020-12-13 ENCOUNTER — Other Ambulatory Visit: Payer: Self-pay

## 2020-12-13 ENCOUNTER — Encounter: Payer: Self-pay | Admitting: Neurology

## 2020-12-13 ENCOUNTER — Ambulatory Visit: Payer: Medicare Other | Admitting: Neurology

## 2020-12-13 VITALS — BP 109/65 | HR 80 | Ht 60.0 in | Wt 153.2 lb

## 2020-12-13 DIAGNOSIS — Z5181 Encounter for therapeutic drug level monitoring: Secondary | ICD-10-CM

## 2020-12-13 DIAGNOSIS — R350 Frequency of micturition: Secondary | ICD-10-CM | POA: Diagnosis not present

## 2020-12-13 DIAGNOSIS — E538 Deficiency of other specified B group vitamins: Secondary | ICD-10-CM | POA: Diagnosis not present

## 2020-12-13 MED ORDER — CLONAZEPAM 0.5 MG PO TABS: 0.5000 mg | ORAL_TABLET | Freq: Every day | ORAL | 0 refills | Status: DC

## 2020-12-13 MED ORDER — QUETIAPINE FUMARATE 25 MG PO TABS: ORAL_TABLET | ORAL | 3 refills | Status: DC

## 2020-12-13 NOTE — Patient Instructions (Signed)
Stop the Quinapril (blood pressure medication)  Reduce the ropinerole 1 mg tablet to one twice a day for 2 weeks, then take one a day for 2 weeks, then stop.  OK to increase the quietepine 25 mg tablet to 1/2 tablet three time a day for hallucinations

## 2020-12-13 NOTE — Progress Notes (Signed)
Reason for visit: Parkinson's disease, dementia, hallucinations, gait disorder  Christina Stevenson is an 75 y.o. female  History of present illness:  Christina Stevenson is a 75 year old right-handed white female with a history of Parkinson's disease associated with a memory disorder.  The patient has had hallucinations off and on, she is on Seroquel taking 25 mg, 1/2 tablet twice daily.  When last seen, she was switched from clonazepam to lorazepam at night and a 25/100 mg Sinemet CR was added in the evening.  Three weeks ago, the patient has had a sudden alteration in her clinical condition.  She has had increased confusion, hallucinations, she is no longer walking as well or able to dress herself or bathe herself.  The patient has had episodic falls.  The husband indicates that she will appear to be spaced out and then will fall over if he does not catch her.  He has switched her back to the clonazepam, she is sleeping better at night.  He has increased the Seroquel to 1/2 tablet 3 times daily which has helped the hallucinations.  The patient comes in on an urgent basis for evaluation of a sudden change of clinical state.  Past Medical History:  Diagnosis Date  . Allergic rhinitis 09/16/2006   Qualifier: Diagnosis of  By: Ardyth Harps MD, Minerva Ends    . Allergic rhinitis, cause unspecified   . Anxiety   . BACK PAIN 07/09/2010   Qualifier: Diagnosis of  By: Coralee Pesa MD, Levada Schilling   . Bundle branch block, unspecified   . Cervical spondylosis without myelopathy 08/08/2015  . Depression 08/2009   multiple stressors: including son who has suffered 2 CVA's and is in a rehab facility, financial difficulties, loss of pets, etc.  . Depression   . Dermatitis   . GERD (gastroesophageal reflux disease)    on bid omeprazole  . Heart palpitations 12/24/2011  . HERPES ZOSTER 09/11/2009   Qualifier: Diagnosis of  By: Coralee Pesa MD, Levada Schilling   . History of shingles   . Hot flashes 12/24/2011  . Hyperlipemia   .  Hypertension   . Irritable bowel syndrome   . Memory disturbance   . Nocturnal leg cramps 11/19/2016  . 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 Nummular dermatitis 11/24/2017   Treated with clobetasol ointment 0.05% BID as needed  . Obesity   . Osteoarthrosis, unspecified whether generalized or localized, unspecified site   . Parkinson disease (HCC) 06/02/12   Probable; being follwed by Dr. Anne Hahn of [facility], has been started on Requip  . Pins and needles sensation 02/22/2015  . REM sleep behavior disorder   . 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 Sciatica of right side   . Spinal stenosis in cervical region   . Uterovaginal prolapse, incomplete    with cystocele    Past Surgical History:  Procedure Laterality Date  . ABDOMINAL HYSTERECTOMY    . bladder resuspension procedure    . CERVICAL LAMINECTOMY    . LUMBAR LAMINECTOMY    . Trigger finger surgery Right    Thumb    Family History  Problem Relation Age of Onset  . Heart Problems Father        lived to be 73 yo  . Cancer - Lung Sister     Social history:  reports that she has never smoked. She has never used smokeless tobacco. She reports that she does not drink alcohol and does not use drugs.    Allergies  Allergen Reactions  .  Penicillins Hives    Has patient had a PCN reaction causing immediate rash, facial/tongue/throat swelling, SOB or lightheadedness with hypotension: Yes Has patient had a PCN reaction causing severe rash involving mucus membranes or skin necrosis: No Has patient had a PCN reaction that required hospitalization: No Has patient had a PCN reaction occurring within the last 10 years: No If all of the above answers are "NO", then may proceed with Cephalosporin use. *per patient has tolerated keflex before   . Prednisone     Swelling, emotionally volatile   . Latex Rash    Medications:  Prior to Admission medications   Medication Sig Start Date End Date Taking? Authorizing Provider  acetaminophen  (TYLENOL) 500 MG tablet Take 1,000 mg by mouth as needed.   Yes [provider]  atorvastatin (LIPITOR) 20 MG tablet TAKE 1 TABLET BY MOUTH  DAILY 09/28/20  Yes Roylene Reason, MD  carbidopa-levodopa (SINEMET IR) 25-100 MG tablet 2 tablets in the morning, 1.5 tablets at midday and in the evening 06/28/20  Yes York Spaniel, MD  Carbidopa-Levodopa ER (SINEMET CR) 25-100 MG tablet controlled release Take 1 tablet by mouth at bedtime. 10/17/20  Yes York Spaniel, MD  clobetasol ointment (TEMOVATE) 0.05 % APPLY TO AFFECTED AREA(S)  TOPICALLY TWO TIMES DAILY 03/03/18  Yes Tyson Alias, MD  diclofenac Sodium (VOLTAREN) 1 % GEL Apply 4 g topically 4 (four) times daily. 02/01/20  Yes Seawell, Jaimie A, DO  donepezil (ARICEPT) 10 MG tablet Take 1 tablet (10 mg total) by mouth at bedtime. 04/17/20  Yes York Spaniel, MD  ibuprofen (ADVIL,MOTRIN) 200 MG tablet Take 200 mg by mouth every 6 (six) hours as needed for mild pain.    Yes [provider]  pantoprazole (PROTONIX) 20 MG tablet TAKE 1 TABLET BY MOUTH  TWICE DAILY 02/21/20  Yes Dellia Cloud, MD  QUEtiapine (SEROQUEL) 25 MG tablet TAKE ONE-HALF TABLET BY  MOUTH TWICE DAILY 07/05/20  Yes York Spaniel, MD  quinapril (ACCUPRIL) 20 MG tablet Take 0.5 tablets (10 mg total) by mouth daily. 06/22/20  Yes Carlynn Purl C, DO  rOPINIRole (REQUIP) 1 MG tablet TAKE 1 TABLET BY MOUTH 3  TIMES DAILY 10/15/20  Yes York Spaniel, MD  sertraline (ZOLOFT) 50 MG tablet TAKE 1 TABLET BY MOUTH  DAILY 07/05/20  Yes York Spaniel, MD  LORazepam (ATIVAN) 0.5 MG tablet Take 2 tablets (1 mg total) by mouth at bedtime. 10/17/20   York Spaniel, MD    ROS:  Out of a complete 14 system review of symptoms, the patient complains only of the following symptoms, and all other reviewed systems are negative.  Confusion, hallucinations Walking difficulty   Blood pressure 109/65, pulse 80, height 5' (1.524 m), weight 153 lb 3.2 oz (69.5  kg).   Blood pressure, right arm, sitting is 114/72.  Blood pressure, right arm, standing is 104/70.  Physical Exam  General: The patient is alert and cooperative at the time of the examination.  Skin: No significant peripheral edema is noted.   Neurologic Exam  Mental status: The patient is alert and oriented x 3 at the time of the examination.   Cranial nerves: Facial symmetry is present. Speech is normal, no aphasia or dysarthria is noted. Extraocular movements are full. Visual fields are full.  Motor: The patient has good strength in all 4 extremities.  Sensory examination: Soft touch sensation is symmetric on the face, arms, and legs.  Coordination: The patient has  good finger-nose-finger and heel-to-shin bilaterally.  Gait and station: The patient is able to walk with a walker, gait is somewhat wide-based when trying to walk independently.  Reflexes: Deep tendon reflexes are symmetric.   Assessment/Plan:  1.  Parkinson's disease  2.  Recent decline in functional level  3.  Hallucinations  The patient is had a recent significant change in her functional level with increased confusion.  We will check blood work today and urinalysis.  If the evaluation is unremarkable, I will set the patient up for physical and Occupational Therapy in the home environment.  We will increase the Seroquel to 1/2 tablet 3 times daily, the patient has been switched over to clonazepam.  We will slowly taper off the Requip going down by 1 mg every 2 weeks until off the medication.  The patient will stop her blood pressure medication.  The description of the falls by the husband seems to be consistent with a drop in blood pressure, the patient does not appear to be significantly orthostatic at the moment.  They will keep their regular revisit appointment in the future.  Marlan Palau MD 12/13/2020 10:55 AM  [facility] [address] [place], [place]  [address]  Phone [phone] Fax [phone]

## 2020-12-14 ENCOUNTER — Telehealth: Payer: Self-pay | Admitting: Neurology

## 2020-12-14 LAB — URINALYSIS, ROUTINE W REFLEX MICROSCOPIC
Bilirubin, UA: NEGATIVE
Glucose, UA: NEGATIVE
Nitrite, UA: POSITIVE — AB
RBC, UA: NEGATIVE
Specific Gravity, UA: 1.024 (ref 1.005–1.030)
Urobilinogen, Ur: 1 mg/dL (ref 0.2–1.0)
pH, UA: 5.5 (ref 5.0–7.5)

## 2020-12-14 LAB — CBC WITH DIFFERENTIAL/PLATELET
Basophils Absolute: 0 10*3/uL (ref 0.0–0.2)
Basos: 0 %
EOS (ABSOLUTE): 0.1 10*3/uL (ref 0.0–0.4)
Eos: 2 %
Hematocrit: 36.8 % (ref 34.0–46.6)
Hemoglobin: 12.2 g/dL (ref 11.1–15.9)
Immature Grans (Abs): 0 10*3/uL (ref 0.0–0.1)
Immature Granulocytes: 0 %
Lymphocytes Absolute: 1 10*3/uL (ref 0.7–3.1)
Lymphs: 18 %
MCH: 29.6 pg (ref 26.6–33.0)
MCHC: 33.2 g/dL (ref 31.5–35.7)
MCV: 89 fL (ref 79–97)
Monocytes Absolute: 0.5 10*3/uL (ref 0.1–0.9)
Monocytes: 8 %
Neutrophils Absolute: 4.3 10*3/uL (ref 1.4–7.0)
Neutrophils: 72 %
Platelets: 177 10*3/uL (ref 150–450)
RBC: 4.12 x10E6/uL (ref 3.77–5.28)
RDW: 12.9 % (ref 11.7–15.4)
WBC: 5.9 10*3/uL (ref 3.4–10.8)

## 2020-12-14 LAB — MICROSCOPIC EXAMINATION
Casts: NONE SEEN /lpf
RBC, Urine: NONE SEEN /hpf (ref 0–2)
WBC, UA: >30 /hpf — AB (ref 0–5)

## 2020-12-14 LAB — SEDIMENTATION RATE: Sed Rate: 8 mm/hr (ref 0–40)

## 2020-12-14 LAB — COMPREHENSIVE METABOLIC PANEL
ALT: 6 IU/L (ref 0–32)
AST: 14 IU/L (ref 0–40)
Albumin/Globulin Ratio: 1.4 (ref 1.2–2.2)
Albumin: 4.2 g/dL (ref 3.7–4.7)
Alkaline Phosphatase: 87 IU/L (ref 44–121)
BUN/Creatinine Ratio: 21 (ref 12–28)
BUN: 21 mg/dL (ref 8–27)
Bilirubin Total: 0.8 mg/dL (ref 0.0–1.2)
CO2: 27 mmol/L (ref 20–29)
Calcium: 9.6 mg/dL (ref 8.7–10.3)
Chloride: 101 mmol/L (ref 96–106)
Creatinine, Ser: 0.99 mg/dL (ref 0.57–1.00)
Globulin, Total: 2.9 g/dL (ref 1.5–4.5)
Glucose: 102 mg/dL — ABNORMAL HIGH (ref 65–99)
Potassium: 4.2 mmol/L (ref 3.5–5.2)
Sodium: 140 mmol/L (ref 134–144)
Total Protein: 7.1 g/dL (ref 6.0–8.5)
eGFR: 60 mL/min/{1.73_m2} (ref >59)

## 2020-12-14 LAB — VITAMIN B12: Vitamin B-12: 265 pg/mL (ref 232–1245)

## 2020-12-14 MED ORDER — SULFAMETHOXAZOLE-TRIMETHOPRIM 800-160 MG PO TABS: 1.0000 | ORAL_TABLET | Freq: Two times a day (BID) | ORAL | 0 refills | Status: DC

## 2020-12-14 NOTE — Telephone Encounter (Signed)
I called the patient.  I talk with husband.  Blood work was unremarkable but urinalysis reveals evidence of urinary tract infection.  The patient is allergic to penicillin but cannot take sulfa drugs, I will call in Bactrim for 6 days.

## 2020-12-25 ENCOUNTER — Telehealth: Payer: Self-pay | Admitting: Neurology

## 2020-12-25 NOTE — Telephone Encounter (Signed)
I called the patient.  The patient is still having hallucinations even after treatment with the urinary tract infection.  The patient is now developing delusional thinking, believing that her husband has another wife somewhere and another family.  We will reduce and then stop the Requip, she currently is taking 1.5 mg of Requip daily, will go to half of a 1 mg tablet twice daily for 2 weeks and then stop the medication altogether.  If this does not improve her symptoms, we may need to go up on the Requip dosing but treatment of delusional thinking is quite difficult.

## 2020-12-25 NOTE — Telephone Encounter (Signed)
Pt;s husband, Zilla Shartzer (on [place]) called, her hallucinations are getting worse. She believes I have another wife some where. Would like a call from the nurse.

## 2021-01-01 ENCOUNTER — Emergency Department (HOSPITAL_COMMUNITY): Payer: Medicare Other

## 2021-01-01 ENCOUNTER — Inpatient Hospital Stay (HOSPITAL_COMMUNITY)
Admission: EM | Admit: 2021-01-01 | Discharge: 2021-01-03 | DRG: 689 | Disposition: A | Discharge status: Home / Self Care | Payer: Medicare Other | Attending: Family Medicine | Admitting: Family Medicine

## 2021-01-01 ENCOUNTER — Encounter (HOSPITAL_COMMUNITY): Payer: Self-pay | Admitting: Emergency Medicine

## 2021-01-01 ENCOUNTER — Other Ambulatory Visit: Payer: Self-pay

## 2021-01-01 DIAGNOSIS — K219 Gastro-esophageal reflux disease without esophagitis: Secondary | ICD-10-CM | POA: Diagnosis not present

## 2021-01-01 DIAGNOSIS — G20 Parkinson's disease: Secondary | ICD-10-CM | POA: Diagnosis present

## 2021-01-01 DIAGNOSIS — N390 Urinary tract infection, site not specified: Secondary | ICD-10-CM | POA: Diagnosis not present

## 2021-01-01 DIAGNOSIS — G9341 Metabolic encephalopathy: Secondary | ICD-10-CM | POA: Diagnosis not present

## 2021-01-01 DIAGNOSIS — Z801 Family history of malignant neoplasm of trachea, bronchus and lung: Secondary | ICD-10-CM | POA: Diagnosis not present

## 2021-01-01 DIAGNOSIS — J302 Other seasonal allergic rhinitis: Secondary | ICD-10-CM | POA: Diagnosis not present

## 2021-01-01 DIAGNOSIS — Z79899 Other long term (current) drug therapy: Secondary | ICD-10-CM | POA: Diagnosis not present

## 2021-01-01 DIAGNOSIS — Z91040 Latex allergy status: Secondary | ICD-10-CM | POA: Diagnosis not present

## 2021-01-01 DIAGNOSIS — M47812 Spondylosis without myelopathy or radiculopathy, cervical region: Secondary | ICD-10-CM | POA: Diagnosis present

## 2021-01-01 DIAGNOSIS — Z880 Allergy status to penicillin: Secondary | ICD-10-CM | POA: Diagnosis not present

## 2021-01-01 DIAGNOSIS — Z87440 Personal history of urinary (tract) infections: Secondary | ICD-10-CM

## 2021-01-01 DIAGNOSIS — D519 Vitamin B12 deficiency anemia, unspecified: Secondary | ICD-10-CM | POA: Diagnosis present

## 2021-01-01 DIAGNOSIS — I447 Left bundle-branch block, unspecified: Secondary | ICD-10-CM | POA: Diagnosis not present

## 2021-01-01 DIAGNOSIS — R296 Repeated falls: Secondary | ICD-10-CM | POA: Diagnosis not present

## 2021-01-01 DIAGNOSIS — Z8619 Personal history of other infectious and parasitic diseases: Secondary | ICD-10-CM | POA: Diagnosis not present

## 2021-01-01 DIAGNOSIS — S7001XA Contusion of right hip, initial encounter: Secondary | ICD-10-CM | POA: Diagnosis present

## 2021-01-01 DIAGNOSIS — Z888 Allergy status to other drugs, medicaments and biological substances status: Secondary | ICD-10-CM | POA: Diagnosis not present

## 2021-01-01 DIAGNOSIS — G9340 Encephalopathy, unspecified: Secondary | ICD-10-CM | POA: Diagnosis not present

## 2021-01-01 DIAGNOSIS — W19XXXA Unspecified fall, initial encounter: Secondary | ICD-10-CM | POA: Diagnosis present

## 2021-01-01 DIAGNOSIS — R442 Other hallucinations: Secondary | ICD-10-CM | POA: Diagnosis present

## 2021-01-01 DIAGNOSIS — N3000 Acute cystitis without hematuria: Secondary | ICD-10-CM

## 2021-01-01 DIAGNOSIS — F32A Depression, unspecified: Secondary | ICD-10-CM | POA: Diagnosis not present

## 2021-01-01 DIAGNOSIS — M4802 Spinal stenosis, cervical region: Secondary | ICD-10-CM | POA: Diagnosis present

## 2021-01-01 DIAGNOSIS — R4182 Altered mental status, unspecified: Secondary | ICD-10-CM

## 2021-01-01 DIAGNOSIS — E785 Hyperlipidemia, unspecified: Secondary | ICD-10-CM | POA: Diagnosis present

## 2021-01-01 DIAGNOSIS — Z8249 Family history of ischemic heart disease and other diseases of the circulatory system: Secondary | ICD-10-CM | POA: Diagnosis not present

## 2021-01-01 DIAGNOSIS — Z043 Encounter for examination and observation following other accident: Secondary | ICD-10-CM | POA: Diagnosis not present

## 2021-01-01 DIAGNOSIS — Z20822 Contact with and (suspected) exposure to covid-19: Secondary | ICD-10-CM | POA: Diagnosis present

## 2021-01-01 DIAGNOSIS — Z743 Need for continuous supervision: Secondary | ICD-10-CM | POA: Diagnosis not present

## 2021-01-01 DIAGNOSIS — I10 Essential (primary) hypertension: Secondary | ICD-10-CM | POA: Diagnosis not present

## 2021-01-01 DIAGNOSIS — R404 Transient alteration of awareness: Secondary | ICD-10-CM | POA: Diagnosis not present

## 2021-01-01 DIAGNOSIS — F419 Anxiety disorder, unspecified: Secondary | ICD-10-CM | POA: Diagnosis present

## 2021-01-01 LAB — CBC WITH DIFFERENTIAL/PLATELET
Abs Immature Granulocytes: 0.01 10*3/uL (ref 0.00–0.07)
Basophils Absolute: 0 10*3/uL (ref 0.0–0.1)
Basophils Relative: 0 %
Eosinophils Absolute: 0.2 10*3/uL (ref 0.0–0.5)
Eosinophils Relative: 3 %
HCT: 34.7 % — ABNORMAL LOW (ref 36.0–46.0)
Hemoglobin: 11.4 g/dL — ABNORMAL LOW (ref 12.0–15.0)
Immature Granulocytes: 0 %
Lymphocytes Relative: 19 %
Lymphs Abs: 1.2 10*3/uL (ref 0.7–4.0)
MCH: 30.1 pg (ref 26.0–34.0)
MCHC: 32.9 g/dL (ref 30.0–36.0)
MCV: 91.6 fL (ref 80.0–100.0)
Monocytes Absolute: 0.5 10*3/uL (ref 0.1–1.0)
Monocytes Relative: 7 %
Neutro Abs: 4.7 10*3/uL (ref 1.7–7.7)
Neutrophils Relative %: 71 %
Platelets: 191 10*3/uL (ref 150–400)
RBC: 3.79 MIL/uL — ABNORMAL LOW (ref 3.87–5.11)
RDW: 12.4 % (ref 11.5–15.5)
WBC: 6.6 10*3/uL (ref 4.0–10.5)
nRBC: 0 % (ref 0.0–0.2)

## 2021-01-01 NOTE — ED Notes (Signed)
Patient transported to X-ray & CT °
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
## 2021-01-02 ENCOUNTER — Encounter (HOSPITAL_COMMUNITY): Payer: Self-pay | Admitting: Internal Medicine

## 2021-01-02 DIAGNOSIS — M4802 Spinal stenosis, cervical region: Secondary | ICD-10-CM | POA: Diagnosis present

## 2021-01-02 DIAGNOSIS — Z880 Allergy status to penicillin: Secondary | ICD-10-CM | POA: Diagnosis not present

## 2021-01-02 DIAGNOSIS — J302 Other seasonal allergic rhinitis: Secondary | ICD-10-CM | POA: Diagnosis present

## 2021-01-02 DIAGNOSIS — Z888 Allergy status to other drugs, medicaments and biological substances status: Secondary | ICD-10-CM | POA: Diagnosis not present

## 2021-01-02 DIAGNOSIS — G9340 Encephalopathy, unspecified: Secondary | ICD-10-CM | POA: Diagnosis present

## 2021-01-02 DIAGNOSIS — W19XXXA Unspecified fall, initial encounter: Secondary | ICD-10-CM | POA: Diagnosis present

## 2021-01-02 DIAGNOSIS — Z79899 Other long term (current) drug therapy: Secondary | ICD-10-CM | POA: Diagnosis not present

## 2021-01-02 DIAGNOSIS — G9341 Metabolic encephalopathy: Secondary | ICD-10-CM | POA: Diagnosis present

## 2021-01-02 DIAGNOSIS — Z801 Family history of malignant neoplasm of trachea, bronchus and lung: Secondary | ICD-10-CM | POA: Diagnosis not present

## 2021-01-02 DIAGNOSIS — Z91040 Latex allergy status: Secondary | ICD-10-CM | POA: Diagnosis not present

## 2021-01-02 DIAGNOSIS — F32A Depression, unspecified: Secondary | ICD-10-CM | POA: Diagnosis present

## 2021-01-02 DIAGNOSIS — I447 Left bundle-branch block, unspecified: Secondary | ICD-10-CM | POA: Diagnosis present

## 2021-01-02 DIAGNOSIS — N3000 Acute cystitis without hematuria: Secondary | ICD-10-CM | POA: Diagnosis not present

## 2021-01-02 DIAGNOSIS — I10 Essential (primary) hypertension: Secondary | ICD-10-CM | POA: Diagnosis present

## 2021-01-02 DIAGNOSIS — K219 Gastro-esophageal reflux disease without esophagitis: Secondary | ICD-10-CM | POA: Diagnosis present

## 2021-01-02 DIAGNOSIS — Z8619 Personal history of other infectious and parasitic diseases: Secondary | ICD-10-CM | POA: Diagnosis not present

## 2021-01-02 DIAGNOSIS — R296 Repeated falls: Secondary | ICD-10-CM | POA: Diagnosis present

## 2021-01-02 DIAGNOSIS — Z20822 Contact with and (suspected) exposure to covid-19: Secondary | ICD-10-CM | POA: Diagnosis present

## 2021-01-02 DIAGNOSIS — E785 Hyperlipidemia, unspecified: Secondary | ICD-10-CM | POA: Diagnosis present

## 2021-01-02 DIAGNOSIS — S7001XA Contusion of right hip, initial encounter: Secondary | ICD-10-CM | POA: Diagnosis present

## 2021-01-02 DIAGNOSIS — M47812 Spondylosis without myelopathy or radiculopathy, cervical region: Secondary | ICD-10-CM | POA: Diagnosis present

## 2021-01-02 DIAGNOSIS — D519 Vitamin B12 deficiency anemia, unspecified: Secondary | ICD-10-CM | POA: Diagnosis present

## 2021-01-02 DIAGNOSIS — Z87440 Personal history of urinary (tract) infections: Secondary | ICD-10-CM | POA: Diagnosis not present

## 2021-01-02 DIAGNOSIS — G20 Parkinson's disease: Secondary | ICD-10-CM | POA: Diagnosis present

## 2021-01-02 DIAGNOSIS — N390 Urinary tract infection, site not specified: Secondary | ICD-10-CM | POA: Diagnosis present

## 2021-01-02 DIAGNOSIS — Z8249 Family history of ischemic heart disease and other diseases of the circulatory system: Secondary | ICD-10-CM | POA: Diagnosis not present

## 2021-01-02 DIAGNOSIS — R442 Other hallucinations: Secondary | ICD-10-CM | POA: Diagnosis present

## 2021-01-02 LAB — RESP PANEL BY RT-PCR (FLU A&B, COVID) ARPGX2
Influenza A by PCR: NEGATIVE
Influenza B by PCR: NEGATIVE
SARS Coronavirus 2 by RT PCR: NEGATIVE

## 2021-01-02 LAB — URINALYSIS, ROUTINE W REFLEX MICROSCOPIC
Bacteria, UA: NONE SEEN
Bilirubin Urine: NEGATIVE
Glucose, UA: NEGATIVE mg/dL
Ketones, ur: 5 mg/dL — AB
Nitrite: NEGATIVE
Protein, ur: NEGATIVE mg/dL
Specific Gravity, Urine: 1.016 (ref 1.005–1.030)
pH: 6 (ref 5.0–8.0)

## 2021-01-02 LAB — FOLATE: Folate: 16.9 ng/mL (ref >5.9)

## 2021-01-02 LAB — FERRITIN: Ferritin: 52 ng/mL (ref 11–307)

## 2021-01-02 LAB — IRON AND TIBC
Iron: 49 ug/dL (ref 28–170)
Saturation Ratios: 16 % (ref 10.4–31.8)
TIBC: 306 ug/dL (ref 250–450)
UIBC: 257 ug/dL

## 2021-01-02 LAB — CBC
HCT: 35.8 % — ABNORMAL LOW (ref 36.0–46.0)
Hemoglobin: 11.9 g/dL — ABNORMAL LOW (ref 12.0–15.0)
MCH: 29.9 pg (ref 26.0–34.0)
MCHC: 33.2 g/dL (ref 30.0–36.0)
MCV: 89.9 fL (ref 80.0–100.0)
Platelets: 190 10*3/uL (ref 150–400)
RBC: 3.98 MIL/uL (ref 3.87–5.11)
RDW: 12.5 % (ref 11.5–15.5)
WBC: 7.1 10*3/uL (ref 4.0–10.5)
nRBC: 0 % (ref 0.0–0.2)

## 2021-01-02 LAB — VITAMIN B12: Vitamin B-12: 123 pg/mL — ABNORMAL LOW (ref 180–914)

## 2021-01-02 LAB — RETICULOCYTES
Immature Retic Fract: 8.8 % (ref 2.3–15.9)
RBC.: 4.06 MIL/uL (ref 3.87–5.11)
Retic Count, Absolute: 75.5 10*3/uL (ref 19.0–186.0)
Retic Ct Pct: 1.9 % (ref 0.4–3.1)

## 2021-01-02 LAB — BASIC METABOLIC PANEL
Anion gap: 7 (ref 5–15)
BUN: 16 mg/dL (ref 8–23)
CO2: 27 mmol/L (ref 22–32)
Calcium: 9.4 mg/dL (ref 8.9–10.3)
Chloride: 106 mmol/L (ref 98–111)
Creatinine, Ser: 0.86 mg/dL (ref 0.44–1.00)
GFR, Estimated: >60 mL/min (ref >60)
Glucose, Bld: 125 mg/dL — ABNORMAL HIGH (ref 70–99)
Potassium: 3.5 mmol/L (ref 3.5–5.1)
Sodium: 140 mmol/L (ref 135–145)

## 2021-01-02 LAB — COMPREHENSIVE METABOLIC PANEL
ALT: <5 U/L (ref 0–44)
AST: 18 U/L (ref 15–41)
Albumin: 4.2 g/dL (ref 3.5–5.0)
Alkaline Phosphatase: 84 U/L (ref 38–126)
Anion gap: 6 (ref 5–15)
BUN: 20 mg/dL (ref 8–23)
CO2: 28 mmol/L (ref 22–32)
Calcium: 9.3 mg/dL (ref 8.9–10.3)
Chloride: 105 mmol/L (ref 98–111)
Creatinine, Ser: 0.89 mg/dL (ref 0.44–1.00)
GFR, Estimated: >60 mL/min (ref >60)
Glucose, Bld: 124 mg/dL — ABNORMAL HIGH (ref 70–99)
Potassium: 3.5 mmol/L (ref 3.5–5.1)
Sodium: 139 mmol/L (ref 135–145)
Total Bilirubin: 0.9 mg/dL (ref 0.3–1.2)
Total Protein: 7.7 g/dL (ref 6.5–8.1)

## 2021-01-02 LAB — AMMONIA: Ammonia: <9 umol/L — ABNORMAL LOW (ref 9–35)

## 2021-01-02 MED ORDER — CYANOCOBALAMIN 1000 MCG/ML IJ SOLN
1000.0000 ug | Freq: Every day | INTRAMUSCULAR | Status: DC
  Administered 2021-01-02 – 2021-01-03 (×2): 1000 ug via INTRAMUSCULAR
  Filled 2021-01-02 (×2): qty 1

## 2021-01-02 MED ORDER — SERTRALINE HCL 50 MG PO TABS
50.0000 mg | ORAL_TABLET | Freq: Every day | ORAL | Status: DC
  Administered 2021-01-02 – 2021-01-03 (×2): 50 mg via ORAL
  Filled 2021-01-02 (×2): qty 1

## 2021-01-02 MED ORDER — ACETAMINOPHEN 325 MG PO TABS
650.0000 mg | ORAL_TABLET | Freq: Four times a day (QID) | ORAL | Status: DC | PRN
  Administered 2021-01-02 (×2): 650 mg via ORAL
  Filled 2021-01-02 (×2): qty 2

## 2021-01-02 MED ORDER — ROPINIROLE HCL 1 MG PO TABS
0.5000 mg | ORAL_TABLET | Freq: Two times a day (BID) | ORAL | Status: DC
  Administered 2021-01-02 – 2021-01-03 (×3): 0.5 mg via ORAL
  Filled 2021-01-02 (×3): qty 1

## 2021-01-02 MED ORDER — QUETIAPINE FUMARATE 25 MG PO TABS
12.5000 mg | ORAL_TABLET | Freq: Three times a day (TID) | ORAL | Status: DC
  Administered 2021-01-02 – 2021-01-03 (×5): 12.5 mg via ORAL
  Filled 2021-01-02 (×5): qty 1

## 2021-01-02 MED ORDER — CLONAZEPAM 0.5 MG PO TABS
0.5000 mg | ORAL_TABLET | Freq: Every day | ORAL | Status: DC
  Administered 2021-01-02: 0.5 mg via ORAL
  Filled 2021-01-02: qty 1

## 2021-01-02 MED ORDER — SODIUM CHLORIDE 0.9 % IV SOLN
1.0000 g | Freq: Once | INTRAVENOUS | Status: AC
  Administered 2021-01-02: 1 g via INTRAVENOUS
  Filled 2021-01-02: qty 10

## 2021-01-02 MED ORDER — PANTOPRAZOLE SODIUM 20 MG PO TBEC
20.0000 mg | DELAYED_RELEASE_TABLET | Freq: Two times a day (BID) | ORAL | Status: DC
  Administered 2021-01-02 – 2021-01-03 (×4): 20 mg via ORAL
  Filled 2021-01-02 (×5): qty 1

## 2021-01-02 MED ORDER — CARBIDOPA-LEVODOPA 25-100 MG PO TABS
1.5000 | ORAL_TABLET | ORAL | Status: DC
  Administered 2021-01-02 – 2021-01-03 (×3): 1.5 via ORAL
  Filled 2021-01-02 (×2): qty 2

## 2021-01-02 MED ORDER — SODIUM CHLORIDE 0.9 % IV SOLN
1.0000 g | INTRAVENOUS | Status: DC
  Administered 2021-01-03: 1 g via INTRAVENOUS
  Filled 2021-01-02: qty 1

## 2021-01-02 MED ORDER — ACETAMINOPHEN 650 MG RE SUPP: 650.0000 mg | Freq: Four times a day (QID) | RECTAL | Status: DC | PRN

## 2021-01-02 MED ORDER — CARBIDOPA-LEVODOPA 25-100 MG PO TABS
2.0000 | ORAL_TABLET | Freq: Every day | ORAL | Status: DC
  Administered 2021-01-02 – 2021-01-03 (×2): 2 via ORAL
  Filled 2021-01-02 (×3): qty 2

## 2021-01-02 MED ORDER — ATORVASTATIN CALCIUM 10 MG PO TABS
20.0000 mg | ORAL_TABLET | Freq: Every day | ORAL | Status: DC
  Administered 2021-01-02: 20 mg via ORAL
  Filled 2021-01-02 (×2): qty 2

## 2021-01-02 MED ORDER — CARBIDOPA-LEVODOPA ER 25-100 MG PO TBCR
1.0000 | EXTENDED_RELEASE_TABLET | Freq: Every day | ORAL | Status: DC
  Administered 2021-01-02: 1 via ORAL
  Filled 2021-01-02 (×2): qty 1

## 2021-01-02 MED ORDER — ENOXAPARIN SODIUM 40 MG/0.4ML IJ SOSY
40.0000 mg | PREFILLED_SYRINGE | INTRAMUSCULAR | Status: DC
  Administered 2021-01-02 – 2021-01-03 (×2): 40 mg via SUBCUTANEOUS
  Filled 2021-01-02 (×2): qty 0.4

## 2021-01-02 NOTE — TOC Initial Note (Signed)
Transition of Care [facility]) - Initial/Assessment Note    Patient Details  Name: Christina Stevenson MRN: [id number] Date of Birth: [birth year]  Transition of Care [facility]) CM/SW Contact:    Lanier Clam, RN Phone Number: 01/02/2021, 2:11 PM  Clinical Narrative:Patient w/restraints-spoke to spouse Greggory Stallion about d/c plans-return back home, PT cons await recc.                    Expected Discharge Plan: Home/Self Care Barriers to Discharge: Continued Medical Work up   Patient Goals and CMS Choice Patient states their goals for this hospitalization and ongoing recovery are:: go home CMS Medicare.gov Compare Post Acute Care list provided to:: Patient Represenative (must comment) Choice offered to / list presented to : Spouse  Expected Discharge Plan and Services Expected Discharge Plan: Home/Self Care   Discharge Planning Services: CM Consult   Living arrangements for the past 2 months: Single Family Home                                      Prior Living Arrangements/Services Living arrangements for the past 2 months: Single Family Home Lives with:: Spouse Patient language and need for interpreter reviewed:: Yes Do you feel safe going back to the place where you live?: Yes      Need for Family Participation in Patient Care: No (Comment) Care giver support system in place?: Yes (comment) Current home services: DME (cane, rw) Criminal Activity/Legal Involvement Pertinent to Current Situation/Hospitalization: No - Comment as needed  Activities of Daily Living Home Assistive Devices/Equipment: Cane (specify quad or straight),Walker (specify type),Eyeglasses,Grab bars in shower ADL Screening (condition at time of admission) Patient's cognitive ability adequate to safely complete daily activities?: Yes Is the patient deaf or have difficulty hearing?: No Does the patient have difficulty seeing, even when wearing glasses/contacts?: No Does the patient have difficulty concentrating,  remembering, or making decisions?: Yes Patient able to express need for assistance with ADLs?: Yes Does the patient have difficulty dressing or bathing?: No Independently performs ADLs?: No Communication: Independent Dressing (OT): Needs assistance Is this a change from baseline?: Pre-admission baseline Grooming: Independent Feeding: Independent Bathing: Independent Toileting: Independent with device (comment) In/Out Bed: Independent with device (comment) Walks in Home: Independent with device (comment) Does the patient have difficulty walking or climbing stairs?: Yes Weakness of Legs: Both Weakness of Arms/Hands: Both  Permission Sought/Granted Permission sought to share information with : Case Manager Permission granted to share information with : Yes, Verbal Permission Granted  Share Information with NAME: Care Manager           Emotional Assessment Appearance:: Appears stated age Attitude/Demeanor/Rapport: Gracious Affect (typically observed): Accepting Orientation: : Oriented to Self,Oriented to Place,Oriented to  Time,Oriented to Situation Alcohol / Substance Use: Not Applicable Psych Involvement: No (comment)  Admission diagnosis:  Acute cystitis without hematuria [N30.00] Acute encephalopathy [G93.40] Frequent falls [R29.6] Altered mental status, unspecified altered mental status type [R41.82] Acute metabolic encephalopathy [G93.41] Patient Active Problem List   Diagnosis Date Noted  . Acute encephalopathy 01/02/2021  . Acute metabolic encephalopathy 01/02/2021  . Globus sensation 05/13/2020  . Chest tightness 02/01/2020  . Insomnia 05/27/2018  . Hallucinations 05/27/2018  . Constipation 07/01/2017  . Nocturnal leg cramps 11/19/2016  . UTI (urinary tract infection) 11/12/2016  . Cervical spondylosis without myelopathy 08/08/2015  . Headache 02/22/2015  . Gait abnormality 11/23/2012  . Memory loss 10/05/2012  .  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 Dysphagia 02/11/2011  . Preventative health care 01/14/2011  . Depression 08/18/2009  . HLD (hyperlipidemia) 09/16/2006  . Essential hypertension 09/16/2006  . GERD 09/16/2006   PCP:  Versie Starks, DO Pharmacy:   [facility] [address] [facility] - [place], [place] - [address] [place] [address] Phone: [phone] Fax: [phone]  [facility] - [place], [place] - [address] [place], [address] [place], [address] [place] [address] Phone: [phone] Fax: [phone]  [id number] [place], [place] - [address] AT [facility] [address] [place] [address] Phone: [phone] Fax: [phone]     Social Determinants of Health (SDOH) Interventions    Readmission Risk Interventions No flowsheet data found.

## 2021-01-02 NOTE — Progress Notes (Signed)
PROGRESS NOTE    Christina Stevenson  ZOX:[id number] DOB: 19-Oct-1945 DOA: 01/01/2021 PCP: Versie Starks, DO   Chief Complaint  Patient presents with  . Altered Mental Status   Brief Narrative:  Christina Stevenson is Christina Stevenson 75 y.o. female with history of Parkinson's disease with hyperlipidemia has been having increasing confusion delusions and hallucinations recently.  Patient's neurologist Dr. Anne Hahn has been trying to taper all Requip.  Per family (history patient was threatening her husband yesterday and was brought to the ER.  Patient also was recently placed on antibiotics for UTI.  She's been admitted in the setting of acute metabolic encephalopathy, thought related to [name] UTI.  Assessment & Plan:   Principal Problem:   Acute encephalopathy Active Problems:   HLD (hyperlipidemia)   Essential hypertension   UTI (urinary tract infection)  1. Acute Metabolic Encephalopathy  1. Likely 2/2 delirium in setting of UTI with parkinson's  2. Continue antibiotics 3. Continue tapering requip per outpatient neurology recomemndations 4. UA with large LE, 21-50 WBC's - urine culture pending 5. B12 low, supplement 6. Delirium precautions 7. Continue seroquel, zoloft, klonopin as prescribed  2. Parkinson's disease on Sinemet. 1. With hallucinations recently, requip has been planned to taper (per 5/10 telephone note, plan for 0.5 mg twice daily x 2 weeks, then stop)  3. Anemia  Vitamin B12 Deficiency 1. Low B12, replace and follow  4. Hyperlipidemia on statins.  5. GERD 1. PPI   DVT prophylaxis: lovenox Code Status: full Family Communication: husband, Information systems manager Christina Stevenson) at bedside Disposition:   Status is: Observation  The patient will require care spanning > 2 midnights and should be moved to inpatient because: Inpatient level of care appropriate due to severity of illness  Dispo: The patient is from: Home              Anticipated d/c is to: pending              Patient  currently is not medically stable to d/c.   Difficult to place patient No       Consultants:   none  Procedures: none  Antimicrobials:  Anti-infectives (From admission, onward)   Start     Dose/Rate Route Frequency Ordered Stop   01/03/21 0200  cefTRIAXone (ROCEPHIN) 1 g in sodium chloride 0.9 % 100 mL IVPB        1 g 200 mL/hr over 30 Minutes Intravenous Every 24 hours 01/02/21 0328     01/02/21 0130  cefTRIAXone (ROCEPHIN) 1 g in sodium chloride 0.9 % 100 mL IVPB       Note to Pharmacy: Has received this in the past without issue   1 g 200 mL/hr over 30 Minutes Intravenous  Once 01/02/21 0125 01/02/21 0259         Subjective: Christina Stevenson&Ox1 Didn't recognize her husband.  Recognized 1 granddaughter, but thought sitter was her other granddaughter.  Confused.    Objective: Vitals:   01/02/21 0230 01/02/21 0300 01/02/21 0350 01/02/21 1330  BP: (!) 148/80 (!) 153/85 (!) 149/74 (!) 91/49  Pulse: (!) 53 80 82 83  Resp: Temp:   98.1 F (36.7 C) 98.4 F (36.9 C)  TempSrc:   Oral Oral  SpO2: 100% 99% 100% 96%    Intake/Output Summary (Last 24 hours) at 01/02/2021 1350 Last data filed at 01/02/2021 0430 Gross per 24 hour  Intake 160 ml  Output --  Net 160 ml   There were no vitals  filed for this visit.  Examination:  General exam: Appears calm and comfortable  Respiratory system: Clear to auscultation. Respiratory effort normal. Cardiovascular system: S1 & S2 heard, RRR.  Gastrointestinal system: Abdomen is nondistended, soft and nontender.  Central nervous system: Alert and oriented x1 (not month, location - didn't recognize husband, thought sitter was her other granddaughter). Moving all extremities. Extremities: no LEE Skin: No rashes, lesions or ulcers Psychiatry: Judgement and insight appear normal. Mood & affect appropriate.     Data Reviewed: I have personally reviewed following labs and imaging studies  CBC: Recent Labs  Lab 01/01/21 2244  01/02/21 0447  WBC 6.6 7.1  NEUTROABS 4.7  --   HGB 11.4* 11.9*  HCT 34.7* 35.8*  MCV 91.6 89.9  PLT 191 190    Basic Metabolic Panel: Recent Labs  Lab 01/01/21 2244 01/02/21 0447  NA 139 140  K 3.5 3.5  CL 105 106  CO2 28 27  GLUCOSE 124* 125*  BUN 20 16  CREATININE 0.89 0.86  CALCIUM 9.3 9.4    GFR: CrCl cannot be calculated (Unknown ideal weight.).  Liver Function Tests: Recent Labs  Lab 01/01/21 2244  AST 18  ALT <5  ALKPHOS 84  BILITOT 0.9  PROT 7.7  ALBUMIN 4.2    CBG: No results for input(s): GLUCAP in the last 168 hours.   Recent Results (from the past 240 hour(s))  Resp Panel by RT-PCR (Flu Christina Stevenson, Covid) Nasopharyngeal Swab     Status: None   Collection Time: 01/01/21 10:45 PM   Specimen: Nasopharyngeal Swab; Nasopharyngeal(NP) swabs in vial transport medium  Stevenson Value Ref Range Status   SARS Coronavirus 2 by RT PCR NEGATIVE NEGATIVE Final    Comment: (NOTE) SARS-CoV-2 target nucleic acids are NOT DETECTED.  The SARS-CoV-2 RNA is generally detectable in upper respiratory specimens during the acute phase of infection. The lowest concentration of SARS-CoV-2 viral copies this assay can detect is 138 copies/mL. Christina Stevenson negative Stevenson does not preclude SARS-Cov-2 infection and should not be used as the sole basis for treatment or other patient management decisions. Christina Stevenson with  improper specimen collection/handling, submission of specimen other than nasopharyngeal swab, presence of viral mutation(s) within the areas targeted by this assay, and inadequate number of viral copies(<138 copies/mL). Christina Stevenson must be combined with clinical observations, patient history, and epidemiological information. The expected Stevenson is Negative.  Fact Sheet for Patients:  BloggerCourse.com  Fact Sheet for Healthcare Providers:  SeriousBroker.it  This test is no t yet approved or  cleared by the [place] FDA and  has been authorized for detection and/or diagnosis of SARS-CoV-2 by FDA under an Emergency Use Authorization (EUA). This EUA will remain  in effect (meaning this test can be used) for the duration of the COVID-19 declaration under Section 564(b)(1) of the Act, 21 U.S.C.section 360bbb-3(b)(1), unless the authorization is terminated  or revoked sooner.       Influenza Aleph Nickson by PCR NEGATIVE NEGATIVE Final   Influenza B by PCR NEGATIVE NEGATIVE Final    Comment: (NOTE) The Xpert Xpress SARS-CoV-2/FLU/RSV plus assay is intended as an aid in the diagnosis of influenza from Nasopharyngeal swab specimens and should not be used as Jalyah Weinheimer sole basis for treatment. Nasal washings and aspirates are unacceptable for Xpert Xpress SARS-CoV-2/FLU/RSV testing.  Fact Sheet for Patients: BloggerCourse.com  Fact Sheet for Healthcare Providers: SeriousBroker.it  This test is not yet approved or cleared by the [place] FDA and has been  authorized for detection and/or diagnosis of SARS-CoV-2 by FDA under an Emergency Use Authorization (EUA). This EUA will remain in effect (meaning this test can be used) for the duration of the COVID-19 declaration under Section 564(b)(1) of the Act, 21 U.S.C. section 360bbb-3(b)(1), unless the authorization is terminated or revoked.  Performed at [facility], [address], [place], [place] [address]          Radiology Studies: DG Chest 2 View  Stevenson Date: 01/01/2021 CLINICAL DATA:  Altered mental status with multiple falls. EXAM: CHEST - 2 VIEW COMPARISON:  May 07, 2018 FINDINGS: The heart size and mediastinal contours are within normal limits. Both lungs are clear. Radiopaque surgical clips are seen overlying the cervical spine. The visualized skeletal structures are otherwise unremarkable. IMPRESSION: No active cardiopulmonary disease.  Electronically Signed   By: Aram Candela M.D.   On: 01/01/2021 23:35   CT Head Wo Contrast  Stevenson Date: 01/02/2021 CLINICAL DATA:  Delirium, altered mental status, multiple falls, EXAM: CT HEAD WITHOUT CONTRAST CT CERVICAL SPINE WITHOUT CONTRAST TECHNIQUE: Multidetector CT imaging of the head and cervical spine was performed following the standard protocol without intravenous contrast. Multiplanar CT image reconstructions of the cervical spine were also generated. COMPARISON:  None. FINDINGS: CT HEAD FINDINGS Brain: Normal anatomic configuration. Parenchymal volume loss is commensurate with the patient's age. Moderate periventricular white matter changes are present likely reflecting the sequela of small vessel ischemia. No abnormal intra or extra-axial mass lesion or fluid collection. No abnormal mass effect or midline shift. No evidence of acute intracranial hemorrhage or infarct. Ventricular size is normal. Cerebellum unremarkable. Vascular: No asymmetric hyperdense vasculature at the skull base. Skull: Intact Sinuses/Orbits: Paranasal sinuses are clear. Orbits are unremarkable. Other: Mastoid air cells and middle ear cavities are clear. CT CERVICAL SPINE FINDINGS Alignment: Anterior cervical discectomy infusion has been performed at C3-C7 with solid fusion of the involved segments with instrumentation noted at C4-C7. There is 2-3 mm anterolisthesis of C7 upon T1, likely degenerative in nature. Skull base and vertebrae: The craniocervical junction is unremarkable. The atlantodental interval is not widened. There is ankylosis of the Dellis Voght left C2-3 facet joint. There is no acute fracture of the cervical spine. Soft tissues and spinal canal: There is mild central canal stenosis at C3-4 secondary to posterior callus formation resulting in effacement of the anterior canal space and mild flattening of the thecal sac. The AP diameter of the spinal canal at this level is 8-9 mm at minimum. The spinal canal is  otherwise widely patent. The prevertebral soft tissues are not thickened. No paraspinal fluid collections are identified. No canal hematoma is identified. Disc levels: There is intervertebral disc space narrowing and endplate remodeling at the residual C7-T1 intervertebral disc space in keeping with moderate degenerative disc disease. Mild endplate changes noted at C2-3 are in keeping with mild degenerative disc disease at this level. Facet arthropathy results in multilevel mild neuroforaminal narrowing, best appreciated on the right at C4-5 and bilaterally at C5-6. Upper chest: Unremarkable Other: None IMPRESSION: No acute intracranial abnormality.  No calvarial fracture. Moderate periventricular white matter changes most in keeping with small vessel ischemia. No acute fracture or listhesis of the cervical spine. Anterior cervical discectomy and fusion with instrumentation C3-C7. Mild central canal stenosis C3-4. Mild multilevel facet arthrosis resulting in mild neuroforaminal narrowing as described above. Electronically Signed   By: Helyn Numbers MD   On: 01/02/2021 00:19   CT Cervical Spine Wo Contrast  Stevenson Date:  01/02/2021 CLINICAL DATA:  Delirium, altered mental status, multiple falls, EXAM: CT HEAD WITHOUT CONTRAST CT CERVICAL SPINE WITHOUT CONTRAST TECHNIQUE: Multidetector CT imaging of the head and cervical spine was performed following the standard protocol without intravenous contrast. Multiplanar CT image reconstructions of the cervical spine were also generated. COMPARISON:  None. FINDINGS: CT HEAD FINDINGS Brain: Normal anatomic configuration. Parenchymal volume loss is commensurate with the patient's age. Moderate periventricular white matter changes are present likely reflecting the sequela of small vessel ischemia. No abnormal intra or extra-axial mass lesion or fluid collection. No abnormal mass effect or midline shift. No evidence of acute intracranial hemorrhage or infarct. Ventricular  size is normal. Cerebellum unremarkable. Vascular: No asymmetric hyperdense vasculature at the skull base. Skull: Intact Sinuses/Orbits: Paranasal sinuses are clear. Orbits are unremarkable. Other: Mastoid air cells and middle ear cavities are clear. CT CERVICAL SPINE FINDINGS Alignment: Anterior cervical discectomy infusion has been performed at C3-C7 with solid fusion of the involved segments with instrumentation noted at C4-C7. There is 2-3 mm anterolisthesis of C7 upon T1, likely degenerative in nature. Skull base and vertebrae: The craniocervical junction is unremarkable. The atlantodental interval is not widened. There is ankylosis of the Idella Lamontagne left C2-3 facet joint. There is no acute fracture of the cervical spine. Soft tissues and spinal canal: There is mild central canal stenosis at C3-4 secondary to posterior callus formation resulting in effacement of the anterior canal space and mild flattening of the thecal sac. The AP diameter of the spinal canal at this level is 8-9 mm at minimum. The spinal canal is otherwise widely patent. The prevertebral soft tissues are not thickened. No paraspinal fluid collections are identified. No canal hematoma is identified. Disc levels: There is intervertebral disc space narrowing and endplate remodeling at the residual C7-T1 intervertebral disc space in keeping with moderate degenerative disc disease. Mild endplate changes noted at C2-3 are in keeping with mild degenerative disc disease at this level. Facet arthropathy results in multilevel mild neuroforaminal narrowing, best appreciated on the right at C4-5 and bilaterally at C5-6. Upper chest: Unremarkable Other: None IMPRESSION: No acute intracranial abnormality.  No calvarial fracture. Moderate periventricular white matter changes most in keeping with small vessel ischemia. No acute fracture or listhesis of the cervical spine. Anterior cervical discectomy and fusion with instrumentation C3-C7. Mild central canal stenosis  C3-4. Mild multilevel facet arthrosis resulting in mild neuroforaminal narrowing as described above. Electronically Signed   By: Helyn Numbers MD   On: 01/02/2021 00:19   DG Hip Unilat W or Wo Pelvis 2-3 Views Right  Stevenson Date: 01/01/2021 CLINICAL DATA:  Status post fall. EXAM: DG HIP (WITH OR WITHOUT PELVIS) 2-3V RIGHT COMPARISON:  None. FINDINGS: There is no evidence of hip fracture or dislocation. Degenerative changes seen involving both hips in the form of joint space narrowing and acetabular sclerosis. IMPRESSION: No acute osseous abnormalities. Electronically Signed   By: Aram Candela M.D.   On: 01/01/2021 23:37        Scheduled Meds: . atorvastatin  20 mg Oral Daily  . carbidopa-levodopa  1.5 tablet Oral 2 times per day  . carbidopa-levodopa  2 tablet Oral QAC breakfast  . Carbidopa-Levodopa ER  1 tablet Oral QHS  . clonazePAM  0.5 mg Oral QHS  . cyanocobalamin  1,000 mcg Intramuscular Daily  . enoxaparin (LOVENOX) injection  40 mg Subcutaneous Q24H  . pantoprazole  20 mg Oral BID  . QUEtiapine  12.5 mg Oral TID  . rOPINIRole  0.5 mg  Oral BID  . sertraline  50 mg Oral Daily   Continuous Infusions: . [START ON 01/03/2021] cefTRIAXone (ROCEPHIN)  IV       LOS: 0 days    Time spent: over 30 min    Lacretia Nicksaldwell Powell, MD [facility]   To contact the attending provider between 7A-7P or the covering provider during after hours 7P-7A, please log into the web site [email or web address] and access using universal [place] password for that web site. If you do not have the password, please call the hospital operator.  01/02/2021, 1:50 PM

## 2021-01-02 NOTE — H&P (Signed)
History and Physical    Christina Stevenson GYI:[id number] DOB: Oct 02, 1945 DOA: 01/01/2021  PCP: Versie Starks, DO  Patient coming from: Home.  Chief Complaint: Confusion and hallucinations.  HPI: Christina Stevenson is a 75 y.o. female with history of Parkinson's disease with hyperlipidemia has been having increasing confusion delusions and hallucinations recently.  Patient's neurologist Dr. Anne Hahn has been trying to taper all Requip.  Per family (history patient was threatening her husband yesterday and was brought to the ER.  Patient also was recently placed on antibiotics for UTI.  ED Course: In the ER patient is afebrile and has episodic confusion and hallucination.  CT head and C-spine was unremarkable.  COVID test was negative.  UA showing some WBCs and leukocyte esterase positive.  Patient admitted for further observation.  Labs show anemia.  Review of Systems: As per HPI, rest all negative.   Past Medical History:  Diagnosis Date  . Allergic rhinitis 09/16/2006   Qualifier: Diagnosis of  By: Ardyth Harps MD, Minerva Ends    . Allergic rhinitis, cause unspecified   . Anxiety   . BACK PAIN 07/09/2010   Qualifier: Diagnosis of  By: Coralee Pesa MD, Levada Schilling   . Bundle branch block, unspecified   . Cervical spondylosis without myelopathy 08/08/2015  . Depression 08/2009   multiple stressors: including son who has suffered 2 CVA's and is in a rehab facility, financial difficulties, loss of pets, etc.  . Depression   . Dermatitis   . GERD (gastroesophageal reflux disease)    on bid omeprazole  . Heart palpitations 12/24/2011  . HERPES ZOSTER 09/11/2009   Qualifier: Diagnosis of  By: Coralee Pesa MD, Levada Schilling   . History of shingles   . Hot flashes 12/24/2011  . Hyperlipemia   . Hypertension   . Irritable bowel syndrome   . Memory disturbance   . Nocturnal leg cramps 11/19/2016  . 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 Nummular dermatitis 11/24/2017   Treated with clobetasol ointment 0.05% BID as needed  .  Obesity   . Osteoarthrosis, unspecified whether generalized or localized, unspecified site   . Parkinson disease (HCC) 06/02/12   Probable; being follwed by Dr. Anne Hahn of [facility], has been started on Requip  . Pins and needles sensation 02/22/2015  . REM sleep behavior disorder   . 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 Sciatica of right side   . Spinal stenosis in cervical region   . Uterovaginal prolapse, incomplete    with cystocele    Past Surgical History:  Procedure Laterality Date  . ABDOMINAL HYSTERECTOMY    . bladder resuspension procedure    . CERVICAL LAMINECTOMY    . LUMBAR LAMINECTOMY    . Trigger finger surgery Right    Thumb     reports that she has never smoked. She has never used smokeless tobacco. She reports that she does not drink alcohol and does not use drugs.  Allergies  Allergen Reactions  . Penicillins Hives    Has patient had a PCN reaction causing immediate rash, facial/tongue/throat swelling, SOB or lightheadedness with hypotension: Yes Has patient had a PCN reaction causing severe rash involving mucus membranes or skin necrosis: No Has patient had a PCN reaction that required hospitalization: No Has patient had a PCN reaction occurring within the last 10 years: No If all of the above answers are "NO", then may proceed with Cephalosporin use. *per patient has tolerated keflex before   . Prednisone     Swelling, emotionally volatile   .  Latex Rash    Family History  Problem Relation Age of Onset  . Heart Problems Father        lived to be 73 yo  . Cancer - Lung Sister     Prior to Admission medications   Medication Sig Start Date End Date Taking? Authorizing Provider  acetaminophen (TYLENOL) 500 MG tablet Take 1,000 mg by mouth as needed for moderate pain.   Yes [provider]  atorvastatin (LIPITOR) 20 MG tablet TAKE 1 TABLET BY MOUTH  DAILY Patient taking differently: Take 20 mg by mouth daily. 09/28/20  Yes Roylene Reason, MD   carbidopa-levodopa (SINEMET IR) 25-100 MG tablet 2 tablets in the morning, 1.5 tablets at midday and in the evening 06/28/20  Yes York Spaniel, MD  Carbidopa-Levodopa ER (SINEMET CR) 25-100 MG tablet controlled release Take 1 tablet by mouth at bedtime. 10/17/20  Yes York Spaniel, MD  clonazePAM (KLONOPIN) 0.5 MG tablet Take 1 tablet (0.5 mg total) by mouth at bedtime. 12/13/20  Yes York Spaniel, MD  ibuprofen (ADVIL,MOTRIN) 200 MG tablet Take 200 mg by mouth every 6 (six) hours as needed for mild pain.    Yes [provider]  pantoprazole (PROTONIX) 20 MG tablet TAKE 1 TABLET BY MOUTH  TWICE DAILY Patient taking differently: Take 20 mg by mouth 2 (two) times daily. 02/21/20  Yes Dellia Cloud, MD  QUEtiapine (SEROQUEL) 25 MG tablet TAKE ONE-HALF TABLET BY  MOUTH THREE TIMES DAILY Patient taking differently: Take 12.5 mg by mouth 3 (three) times daily. 12/13/20  Yes York Spaniel, MD  sertraline (ZOLOFT) 50 MG tablet TAKE 1 TABLET BY MOUTH  DAILY Patient taking differently: Take 50 mg by mouth daily. 07/05/20  Yes York Spaniel, MD  quinapril (ACCUPRIL) 20 MG tablet Take 0.5 tablets (10 mg total) by mouth daily. Patient not taking: Reported on 01/01/2021 06/22/20   Gust Rung, DO    Physical Exam: Constitutional: Moderately built and nourished. Vitals:   01/02/21 0130 01/02/21 0200 01/02/21 0230 01/02/21 0300  BP: 137/82 (!) 151/56 (!) 148/80 (!) 153/85  Pulse: 69 68 (!) 53 80  Resp: 12 13 15 14   SpO2: 100% 100% 100% 99%   Eyes: Anicteric no pallor. ENMT: No discharge from the ears eyes nose or mouth. Neck: No mass felt.  No neck rigidity. Respiratory: No rhonchi or crepitations. Cardiovascular: S1-S2 heard. Abdomen: Soft nontender bowel sounds present. Musculoskeletal: No edema. Skin: No rash Neurologic: Alert awake oriented to time place and person.  Moves all extremities. Psychiatric: Appears normal.  Normal affect.   Labs on Admission: I have  personally reviewed following labs and imaging studies  CBC: Recent Labs  Lab 01/01/21 2244  WBC 6.6  NEUTROABS 4.7  HGB 11.4*  HCT 34.7*  MCV 91.6  PLT 191   Basic Metabolic Panel: Recent Labs  Lab 01/01/21 2244  NA 139  K 3.5  CL 105  CO2 28  GLUCOSE 124*  BUN 20  CREATININE 0.89  CALCIUM 9.3   GFR: CrCl cannot be calculated (Unknown ideal weight.). Liver Function Tests: Recent Labs  Lab 01/01/21 2244  AST 18  ALT <5  ALKPHOS 84  BILITOT 0.9  PROT 7.7  ALBUMIN 4.2   No results for input(s): LIPASE, AMYLASE in the last 168 hours. Recent Labs  Lab 01/01/21 2244  AMMONIA <9*   Coagulation Profile: No results for input(s): INR, PROTIME in the last 168 hours. Cardiac Enzymes: No results for input(s): CKTOTAL, CKMB,  CKMBINDEX, TROPONINI in the last 168 hours. BNP (last 3 results) No results for input(s): PROBNP in the last 8760 hours. HbA1C: No results for input(s): HGBA1C in the last 72 hours. CBG: No results for input(s): GLUCAP in the last 168 hours. Lipid Profile: No results for input(s): CHOL, HDL, LDLCALC, TRIG, CHOLHDL, LDLDIRECT in the last 72 hours. Thyroid Function Tests: No results for input(s): TSH, T4TOTAL, FREET4, T3FREE, THYROIDAB in the last 72 hours. Anemia Panel: No results for input(s): VITAMINB12, FOLATE, FERRITIN, TIBC, IRON, RETICCTPCT in the last 72 hours. Urine analysis:    Component Value Date/Time   COLORURINE YELLOW 01/02/2021 0100   APPEARANCEUR CLEAR 01/02/2021 0100   APPEARANCEUR Turbid (A) 12/13/2020 1142   LABSPEC 1.016 01/02/2021 0100   PHURINE 6.0 01/02/2021 0100   GLUCOSEU NEGATIVE 01/02/2021 0100   HGBUR SMALL (A) 01/02/2021 0100   BILIRUBINUR NEGATIVE 01/02/2021 0100   BILIRUBINUR Negative 12/13/2020 1142   KETONESUR 5 (A) 01/02/2021 0100   PROTEINUR NEGATIVE 01/02/2021 0100   UROBILINOGEN 0.2 05/10/2020 1620   UROBILINOGEN 0.2 12/24/2011 1031   NITRITE NEGATIVE 01/02/2021 0100   LEUKOCYTESUR LARGE (A)  01/02/2021 0100   Sepsis Labs: (procalcitonin:4,lacticidven:4) ) Recent Results (from the past 240 hour(s))  Resp Panel by RT-PCR (Flu A&B, Covid) Nasopharyngeal Swab     Status: None   Collection Time: 01/01/21 10:45 PM   Specimen: Nasopharyngeal Swab; Nasopharyngeal(NP) swabs in vial transport medium  Result Value Ref Range Status   SARS Coronavirus 2 by RT PCR NEGATIVE NEGATIVE Final    Comment: (NOTE) SARS-CoV-2 target nucleic acids are NOT DETECTED.  The SARS-CoV-2 RNA is generally detectable in upper respiratory specimens during the acute phase of infection. The lowest concentration of SARS-CoV-2 viral copies this assay can detect is 138 copies/mL. A negative result does not preclude SARS-Cov-2 infection and should not be used as the sole basis for treatment or other patient management decisions. A negative result may occur with  improper specimen collection/handling, submission of specimen other than nasopharyngeal swab, presence of viral mutation(s) within the areas targeted by this assay, and inadequate number of viral copies(<138 copies/mL). A negative result must be combined with clinical observations, patient history, and epidemiological information. The expected result is Negative.  Fact Sheet for Patients:  BloggerCourse.com  Fact Sheet for Healthcare Providers:  SeriousBroker.it  This test is no t yet approved or cleared by the [place] FDA and  has been authorized for detection and/or diagnosis of SARS-CoV-2 by FDA under an Emergency Use Authorization (EUA). This EUA will remain  in effect (meaning this test can be used) for the duration of the COVID-19 declaration under Section 564(b)(1) of the Act, 21 U.S.C.section 360bbb-3(b)(1), unless the authorization is terminated  or revoked sooner.       Influenza A by PCR NEGATIVE NEGATIVE Final   Influenza B by PCR NEGATIVE NEGATIVE Final     Comment: (NOTE) The Xpert Xpress SARS-CoV-2/FLU/RSV plus assay is intended as an aid in the diagnosis of influenza from Nasopharyngeal swab specimens and should not be used as a sole basis for treatment. Nasal washings and aspirates are unacceptable for Xpert Xpress SARS-CoV-2/FLU/RSV testing.  Fact Sheet for Patients: BloggerCourse.com  Fact Sheet for Healthcare Providers: SeriousBroker.it  This test is not yet approved or cleared by the [place] FDA and has been authorized for detection and/or diagnosis of SARS-CoV-2 by FDA under an Emergency Use Authorization (EUA). This EUA will remain in effect (meaning this test can be used) for the duration of  the COVID-19 declaration under Section 564(b)(1) of the Act, 21 U.S.C. section 360bbb-3(b)(1), unless the authorization is terminated or revoked.  Performed at [facility], [address], [place], [place] [address]      Radiological Exams on Admission: DG Chest 2 View  Result Date: 01/01/2021 CLINICAL DATA:  Altered mental status with multiple falls. EXAM: CHEST - 2 VIEW COMPARISON:  May 07, 2018 FINDINGS: The heart size and mediastinal contours are within normal limits. Both lungs are clear. Radiopaque surgical clips are seen overlying the cervical spine. The visualized skeletal structures are otherwise unremarkable. IMPRESSION: No active cardiopulmonary disease. Electronically Signed   By: Aram Candela M.D.   On: 01/01/2021 23:35   CT Head Wo Contrast  Result Date: 01/02/2021 CLINICAL DATA:  Delirium, altered mental status, multiple falls, EXAM: CT HEAD WITHOUT CONTRAST CT CERVICAL SPINE WITHOUT CONTRAST TECHNIQUE: Multidetector CT imaging of the head and cervical spine was performed following the standard protocol without intravenous contrast. Multiplanar CT image reconstructions of the cervical spine were also generated. COMPARISON:  None. FINDINGS:  CT HEAD FINDINGS Brain: Normal anatomic configuration. Parenchymal volume loss is commensurate with the patient's age. Moderate periventricular white matter changes are present likely reflecting the sequela of small vessel ischemia. No abnormal intra or extra-axial mass lesion or fluid collection. No abnormal mass effect or midline shift. No evidence of acute intracranial hemorrhage or infarct. Ventricular size is normal. Cerebellum unremarkable. Vascular: No asymmetric hyperdense vasculature at the skull base. Skull: Intact Sinuses/Orbits: Paranasal sinuses are clear. Orbits are unremarkable. Other: Mastoid air cells and middle ear cavities are clear. CT CERVICAL SPINE FINDINGS Alignment: Anterior cervical discectomy infusion has been performed at C3-C7 with solid fusion of the involved segments with instrumentation noted at C4-C7. There is 2-3 mm anterolisthesis of C7 upon T1, likely degenerative in nature. Skull base and vertebrae: The craniocervical junction is unremarkable. The atlantodental interval is not widened. There is ankylosis of the a left C2-3 facet joint. There is no acute fracture of the cervical spine. Soft tissues and spinal canal: There is mild central canal stenosis at C3-4 secondary to posterior callus formation resulting in effacement of the anterior canal space and mild flattening of the thecal sac. The AP diameter of the spinal canal at this level is 8-9 mm at minimum. The spinal canal is otherwise widely patent. The prevertebral soft tissues are not thickened. No paraspinal fluid collections are identified. No canal hematoma is identified. Disc levels: There is intervertebral disc space narrowing and endplate remodeling at the residual C7-T1 intervertebral disc space in keeping with moderate degenerative disc disease. Mild endplate changes noted at C2-3 are in keeping with mild degenerative disc disease at this level. Facet arthropathy results in multilevel mild neuroforaminal narrowing,  best appreciated on the right at C4-5 and bilaterally at C5-6. Upper chest: Unremarkable Other: None IMPRESSION: No acute intracranial abnormality.  No calvarial fracture. Moderate periventricular white matter changes most in keeping with small vessel ischemia. No acute fracture or listhesis of the cervical spine. Anterior cervical discectomy and fusion with instrumentation C3-C7. Mild central canal stenosis C3-4. Mild multilevel facet arthrosis resulting in mild neuroforaminal narrowing as described above. Electronically Signed   By: Helyn Numbers MD   On: 01/02/2021 00:19   CT Cervical Spine Wo Contrast  Result Date: 01/02/2021 CLINICAL DATA:  Delirium, altered mental status, multiple falls, EXAM: CT HEAD WITHOUT CONTRAST CT CERVICAL SPINE WITHOUT CONTRAST TECHNIQUE: Multidetector CT imaging of the head and cervical spine was performed following  the standard protocol without intravenous contrast. Multiplanar CT image reconstructions of the cervical spine were also generated. COMPARISON:  None. FINDINGS: CT HEAD FINDINGS Brain: Normal anatomic configuration. Parenchymal volume loss is commensurate with the patient's age. Moderate periventricular white matter changes are present likely reflecting the sequela of small vessel ischemia. No abnormal intra or extra-axial mass lesion or fluid collection. No abnormal mass effect or midline shift. No evidence of acute intracranial hemorrhage or infarct. Ventricular size is normal. Cerebellum unremarkable. Vascular: No asymmetric hyperdense vasculature at the skull base. Skull: Intact Sinuses/Orbits: Paranasal sinuses are clear. Orbits are unremarkable. Other: Mastoid air cells and middle ear cavities are clear. CT CERVICAL SPINE FINDINGS Alignment: Anterior cervical discectomy infusion has been performed at C3-C7 with solid fusion of the involved segments with instrumentation noted at C4-C7. There is 2-3 mm anterolisthesis of C7 upon T1, likely degenerative in nature.  Skull base and vertebrae: The craniocervical junction is unremarkable. The atlantodental interval is not widened. There is ankylosis of the a left C2-3 facet joint. There is no acute fracture of the cervical spine. Soft tissues and spinal canal: There is mild central canal stenosis at C3-4 secondary to posterior callus formation resulting in effacement of the anterior canal space and mild flattening of the thecal sac. The AP diameter of the spinal canal at this level is 8-9 mm at minimum. The spinal canal is otherwise widely patent. The prevertebral soft tissues are not thickened. No paraspinal fluid collections are identified. No canal hematoma is identified. Disc levels: There is intervertebral disc space narrowing and endplate remodeling at the residual C7-T1 intervertebral disc space in keeping with moderate degenerative disc disease. Mild endplate changes noted at C2-3 are in keeping with mild degenerative disc disease at this level. Facet arthropathy results in multilevel mild neuroforaminal narrowing, best appreciated on the right at C4-5 and bilaterally at C5-6. Upper chest: Unremarkable Other: None IMPRESSION: No acute intracranial abnormality.  No calvarial fracture. Moderate periventricular white matter changes most in keeping with small vessel ischemia. No acute fracture or listhesis of the cervical spine. Anterior cervical discectomy and fusion with instrumentation C3-C7. Mild central canal stenosis C3-4. Mild multilevel facet arthrosis resulting in mild neuroforaminal narrowing as described above. Electronically Signed   By: Helyn Numbers MD   On: 01/02/2021 00:19   DG Hip Unilat W or Wo Pelvis 2-3 Views Right  Result Date: 01/01/2021 CLINICAL DATA:  Status post fall. EXAM: DG HIP (WITH OR WITHOUT PELVIS) 2-3V RIGHT COMPARISON:  None. FINDINGS: There is no evidence of hip fracture or dislocation. Degenerative changes seen involving both hips in the form of joint space narrowing and acetabular  sclerosis. IMPRESSION: No acute osseous abnormalities. Electronically Signed   By: Aram Candela M.D.   On: 01/01/2021 23:37    EKG: Independently reviewed.  Normal sinus rhythm.  LBBB.  Assessment/Plan Principal Problem:   Acute encephalopathy Active Problems:   HLD (hyperlipidemia)   Essential hypertension   UTI (urinary tract infection)    1. Acute confusional state examination and durations being followed by patient's neurologist and patient is on a tapering dose of Requip.  UA shows some WBCs and bacteria and leukocyte esterase.  On ceftriaxone.  Check urine cultures.  Patient takes Seroquel, Klonopin and Zoloft.  Patient's home dose of Requip has to be verified at this time and given. 2. Parkinson's disease on Sinemet. 3. Anemia -we will check anemia panel. 4. Hyperlipidemia on statins.   DVT prophylaxis: Lovenox. Code Status: Full code. Family Communication:  Patient's granddaughter. Disposition Plan: Home when stable. Consults called: None. Admission status: Observation.   Eduard ClosArshad N Jarrah Seher MD [facility] Pager [phone].  If 7PM-7AM, please contact night-coverage [email or web address] Password [email or web address]  01/02/2021, 3:29 AM

## 2021-01-02 NOTE — Plan of Care (Signed)
  Problem: Education: Goal: Knowledge of General Education information will improve Description: Including pain rating scale, medication(s)/side effects and non-pharmacologic comfort measures Outcome: Progressing   Problem: Activity: Goal: Risk for activity intolerance will decrease Outcome: Progressing   Problem: Nutrition: Goal: Adequate nutrition will be maintained Outcome: Progressing   

## 2021-01-03 LAB — PHOSPHORUS: Phosphorus: 3.7 mg/dL (ref 2.5–4.6)

## 2021-01-03 LAB — COMPREHENSIVE METABOLIC PANEL
ALT: <5 U/L (ref 0–44)
AST: 17 U/L (ref 15–41)
Albumin: 4 g/dL (ref 3.5–5.0)
Alkaline Phosphatase: 90 U/L (ref 38–126)
Anion gap: 7 (ref 5–15)
BUN: 18 mg/dL (ref 8–23)
CO2: 26 mmol/L (ref 22–32)
Calcium: 9.3 mg/dL (ref 8.9–10.3)
Chloride: 108 mmol/L (ref 98–111)
Creatinine, Ser: 0.87 mg/dL (ref 0.44–1.00)
GFR, Estimated: >60 mL/min (ref >60)
Glucose, Bld: 111 mg/dL — ABNORMAL HIGH (ref 70–99)
Potassium: 3.6 mmol/L (ref 3.5–5.1)
Sodium: 141 mmol/L (ref 135–145)
Total Bilirubin: 0.9 mg/dL (ref 0.3–1.2)
Total Protein: 7.3 g/dL (ref 6.5–8.1)

## 2021-01-03 LAB — CBC WITH DIFFERENTIAL/PLATELET
Abs Immature Granulocytes: 0.01 10*3/uL (ref 0.00–0.07)
Basophils Absolute: 0 10*3/uL (ref 0.0–0.1)
Basophils Relative: 1 %
Eosinophils Absolute: 0.2 10*3/uL (ref 0.0–0.5)
Eosinophils Relative: 3 %
HCT: 35.5 % — ABNORMAL LOW (ref 36.0–46.0)
Hemoglobin: 11.5 g/dL — ABNORMAL LOW (ref 12.0–15.0)
Immature Granulocytes: 0 %
Lymphocytes Relative: 18 %
Lymphs Abs: 0.9 10*3/uL (ref 0.7–4.0)
MCH: 29.7 pg (ref 26.0–34.0)
MCHC: 32.4 g/dL (ref 30.0–36.0)
MCV: 91.7 fL (ref 80.0–100.0)
Monocytes Absolute: 0.5 10*3/uL (ref 0.1–1.0)
Monocytes Relative: 9 %
Neutro Abs: 3.6 10*3/uL (ref 1.7–7.7)
Neutrophils Relative %: 69 %
Platelets: 170 10*3/uL (ref 150–400)
RBC: 3.87 MIL/uL (ref 3.87–5.11)
RDW: 12.7 % (ref 11.5–15.5)
WBC: 5.2 10*3/uL (ref 4.0–10.5)
nRBC: 0 % (ref 0.0–0.2)

## 2021-01-03 LAB — URINE CULTURE

## 2021-01-03 LAB — MAGNESIUM: Magnesium: 2.2 mg/dL (ref 1.7–2.4)

## 2021-01-03 LAB — AMMONIA: Ammonia: 19 umol/L (ref 9–35)

## 2021-01-03 MED ORDER — CEPHALEXIN 250 MG PO CAPS: 250.0000 mg | ORAL_CAPSULE | Freq: Four times a day (QID) | ORAL | 0 refills | Status: AC

## 2021-01-03 MED ORDER — VITAMIN B-12 1000 MCG PO TABS: 1000.0000 ug | ORAL_TABLET | Freq: Every day | ORAL | 0 refills | Status: AC

## 2021-01-03 MED ORDER — ROPINIROLE HCL 0.5 MG PO TABS: 0.5000 mg | ORAL_TABLET | Freq: Every day | ORAL | 0 refills | Status: DC

## 2021-01-03 NOTE — Evaluation (Signed)
Physical Therapy Evaluation Patient Details Name: Christina Stevenson MRN: [id number] DOB: 06-Sep-1945 Today's Date: 01/03/2021   History of Present Illness  75 y.o. female presenting with increasing confusion, delusions, and hallucinations and admitted for acute metabolic encephalopathy.  PMHx Parkinson's disease, hyperlipidemia, sleep disorder, back pain  Clinical Impression  Pt admitted with above diagnosis.  Pt currently with functional limitations due to the deficits listed below (see PT Problem List). Pt will benefit from skilled PT to increase their independence and safety with mobility to allow discharge to the venue listed below.  Pt requiring multimodal cues and significant assist for stand pivot to [facility].  Pt with difficulty turning and requiring assist for stability.  Uncertain if family can provide current assist level, so recommend SNF if they cannot.     Follow Up Recommendations SNF    Equipment Recommendations  None recommended by PT    Recommendations for Other Services       Precautions / Restrictions Precautions Precautions: Fall      Mobility  Bed Mobility Overal bed mobility: Needs Assistance Bed Mobility: Supine to Sit     Supine to sit: Mod assist     General bed mobility comments: multimodal cues for technique, assist for upper body and scooting to EOB    Transfers Overall transfer level: Needs assistance Equipment used: Rolling walker (2 wheeled) Transfers: Sit to/from [employer] Transfers Sit to Stand: Max assist Stand pivot transfers: Max assist       General transfer comment: attempted without assistive device however pt very unsteady, provided RW and multimodal cues for technique, pt with great difficulty turning feet and required increased time for step by step cues and steadying, pt had BM on BSC and assisted with hygiene, pt assisted to standing again and switched out [facility] for recliner  Ambulation/Gait             General Gait  Details: deferred for safety  Stairs            Wheelchair Mobility    Modified Rankin (Stroke Patients Only)       Balance Overall balance assessment: Needs assistance Sitting-balance support: Bilateral upper extremity supported;Feet supported Sitting balance-Leahy Scale: Poor   Postural control: Posterior lean Standing balance support: Bilateral upper extremity supported Standing balance-Leahy Scale: Zero                               Pertinent Vitals/Pain Pain Assessment: No/denies pain    Home Living Family/patient expects to be discharged to:: Private residence Living Arrangements: Other relatives;Spouse/significant other Available Help at Discharge: Family;Available 24 hours/day Type of Home: House Home Access: Stairs to enter Entrance Stairs-Rails: Right Entrance Stairs-Number of Steps: 8 Home Layout: Able to live on main level with bedroom/bathroom Home Equipment: Walker - 4 wheels;Cane - single point;Bedside commode      Prior Function Level of Independence: Needs assistance   Gait / Transfers Assistance Needed: granddaughter reports pt ambulates with assist and SPC or rollator  ADL's / Homemaking Assistance Needed: requiring assist for feeding        Hand Dominance        Extremity/Trunk Assessment        Lower Extremity Assessment Lower Extremity Assessment: Generalized weakness       Communication   Communication: No difficulties  Cognition Arousal/Alertness: Awake/alert Behavior During Therapy: WFL for tasks assessed/performed Overall Cognitive Status: History of cognitive impairments - at baseline  General Comments      Exercises     Assessment/Plan    PT Assessment Patient needs continued PT services  PT Problem List Decreased strength;Decreased activity tolerance;Decreased balance;Decreased knowledge of use of DME;Decreased mobility;Decreased  cognition       PT Treatment Interventions DME instruction;Gait training;Balance training;Therapeutic exercise;Functional mobility training;Patient/family education;Therapeutic activities    PT Goals (Current goals can be found in the Care Plan section)  Acute Rehab PT Goals PT Goal Formulation: With patient/family Time For Goal Achievement: 01/17/21 Potential to Achieve Goals: Fair    Frequency Min 2X/week   Barriers to discharge        Co-evaluation               AM-PAC PT "6 Clicks" Mobility  Outcome Measure Help needed turning from your back to your side while in a flat bed without using bedrails?: A Lot Help needed moving from lying on your back to sitting on the side of a flat bed without using bedrails?: A Lot Help needed moving to and from a bed to a chair (including a wheelchair)?: A Lot Help needed standing up from a chair using your arms (e.g., wheelchair or bedside chair)?: Total Help needed to walk in hospital room?: Total Help needed climbing 3-5 steps with a railing? : Total 6 Click Score: 9    End of Session Equipment Utilized During Treatment: Gait belt Activity Tolerance: Patient tolerated treatment well Patient left: in chair;with call bell/phone within reach;with chair alarm set;with family/visitor present Nurse Communication: Mobility status;Need for lift equipment (recommended Stedy for return to bed) PT Visit Diagnosis: Other abnormalities of gait and mobility (R26.89)    Time: 4235-3614 PT Time Calculation (min) (ACUTE ONLY): 28 min   Charges:   PT Evaluation $PT Eval Moderate Complexity: 1 Mod         Kati PT, DPT Acute Rehabilitation Services Pager: [phone] Office: [phone]   Sarajane Jews 01/03/2021, 12:35 PM

## 2021-01-03 NOTE — Discharge Summary (Signed)
Physician Discharge Summary  Christina Stevenson:[id number] DOB: September 27, 1945 DOA: 01/01/2021  PCP: [name] Geisha Abernathy, DO  Admit date: 01/01/2021 Discharge date: 01/03/2021  Time spent: 40 minutes  Recommendations for Outpatient Follow-up:  1. Follow outpatient CBC/CMP 2. Follow delirium/encephalopathy/hallucinations off requip  3. Follow UTI symptoms, improvement with abx  4. Follow B12 deficiency with PCP outpatient - additional w/u per PCP  Discharge Diagnoses:  Principal Problem:   Acute encephalopathy Active Problems:   HLD (hyperlipidemia)   Essential hypertension   UTI (urinary tract infection)   Acute metabolic encephalopathy   Discharge Condition: stable  Diet recommendation: heart healthy  There were no vitals filed for this visit.  History of present illness:  Christina Simko Hendrixis Christina Stevenson 75 y.o.femalewithhistory of Parkinson's disease with hyperlipidemia has been having increasing confusion delusions and hallucinations recently. Patient's neurologist Dr. Anne Hahn has been trying to taper all Requip. Per family (history patient was threatening her husband yesterday and was brought to the ER. Patient also was recently placed on antibiotics for UTI.  She's been admitted in the setting of acute metabolic encephalopathy, thought related to Christina Stevenson UTI.  She's gradually improved on antibiotics.  Urine culture was notable for multiple species.  Stable for discharge on 5/19, she's about 90% of her baseline per her husband.  Plan for completed antibiotic course given improvement on abx and symptoms concerning for UTI.  Continue requip taper.  Follow outpatient.    Hospital Course:  1. Acute Metabolic Encephalopathy        1. Likely 2/2 delirium in setting of UTI with parkinson's  2. Continue antibiotics (urine cx with multiple species, but reported frequency on night prior to admission and improvement on abx, will d/c with keflex to complete course) 3. Continue tapering requip per  outpatient neurology recomemndations - discharge with quick taper, discussed with our neurologist here over phone 4. UA with large LE, 21-50 WBC's - urine culture multiple species 5. B12 low, supplement 6. Delirium precautions 7. Continue seroquel, zoloft, klonopin as prescribed 8. Seems close to her baseline, will discharge home as husband notes she would not want SNF  2. Parkinson's disease on Sinemet. 1. With hallucinations recently, requip has been planned to taper (per 5/10 telephone note, plan for 0.5 mg twice daily x 2 weeks, then stop) 2. Discussed with neurology here, recommending taper over next 2 days and d/c  3. Anemia Vitamin B12 Deficiency 1. Low B12, replace and follow 2. Follow B12 def outpatient with PCP  4. Hyperlipidemia on statins.  5. GERD 1. PPI   Procedures:  none  Consultations:  none  Discharge Exam: Vitals:   01/03/21 0657 01/03/21 1300  BP: (!) 150/64 (!) 89/66  Pulse: 79 81  Resp:  20  Temp: 98.8 F (37.1 C) 98.4 F (36.9 C)  SpO2: 96% 94%   Pleasantly confused Husband notes she's about 90% back to her baseline  General: No acute distress. Cardiovascular: Heart sounds show [name] regular rate, and rhythm.  Lungs: unlabored Abdomen: Soft, nontender, nondistended Neurological: Alert and disoriented to place and time.  Able to recognize family in room, improved from yesterday. Moves all extremities 4 . Cranial nerves II through XII grossly intact. Skin: Warm and dry. No rashes or lesions. Extremities: No clubbing or cyanosis. No edema.   Discharge Instructions   Discharge Instructions    Call MD for:  difficulty breathing, headache or visual disturbances   Complete by: As directed    Call MD for:  extreme fatigue  Complete by: As directed    Call MD for:  hives   Complete by: As directed    Call MD for:  persistant dizziness or light-headedness   Complete by: As directed    Call MD for:  persistant nausea and vomiting    Complete by: As directed    Call MD for:  redness, tenderness, or signs of infection (pain, swelling, redness, odor or green/yellow discharge around incision site)   Complete by: As directed    Call MD for:  severe uncontrolled pain   Complete by: As directed    Call MD for:  temperature >100.4   Complete by: As directed    Diet - low sodium heart healthy   Complete by: As directed    Discharge instructions   Complete by: As directed    You were seen for confusion in the setting of Christina Stevenson urinary tract infection.  Your urine culture did not clearly show this, but since you've improved on antibiotics, we'll finish Christina Stevenson course for Christina Stevenson presumed infection in the setting of your symptoms.  Decrease the requip to 0.5 mg daily for the next 2 days, then stop this.   You have B12 deficiency, we'll send you home with [name] B12 supplement.  Follow up with your PCP for additional workup.  Please follow up with Dr. Anne Hahn within the next week or so.  Call your PCP for Christina Stevenson follow up appointment as well.  Return for new, recurrent, or worsening symptoms.  Please ask your PCP to request records from this hospitalization so they know what was done and what the next steps will be.   Increase activity slowly   Complete by: As directed    No wound care   Complete by: As directed      Allergies as of 01/03/2021      Reactions   Penicillins Hives   Has patient had [name] PCN reaction causing immediate rash, facial/tongue/throat swelling, SOB or lightheadedness with hypotension: Yes Has patient had [name] PCN reaction causing severe rash involving mucus membranes or skin necrosis: No Has patient had [name] PCN reaction that required hospitalization: No Has patient had [name] PCN reaction occurring within the last 10 years: No If all of the above answers are "NO", then may proceed with Cephalosporin use. *per patient has tolerated keflex before   Prednisone    Swelling, emotionally volatile   Latex Rash      Medication List    STOP  taking these medications   quinapril 20 MG tablet Commonly known as: ACCUPRIL     TAKE these medications   acetaminophen 500 MG tablet Commonly known as: TYLENOL Take 1,000 mg by mouth as needed for moderate pain.   atorvastatin 20 MG tablet Commonly known as: LIPITOR TAKE 1 TABLET BY MOUTH  DAILY   carbidopa-levodopa 25-100 MG tablet Commonly known as: SINEMET IR 2 tablets in the morning, 1.5 tablets at midday and in the evening   Carbidopa-Levodopa ER 25-100 MG tablet controlled release Commonly known as: Sinemet CR Take 1 tablet by mouth at bedtime.   cephALEXin 250 MG capsule Commonly known as: KEFLEX Take 1 capsule (250 mg total) by mouth 4 (four) times daily for 5 days.   clonazePAM 0.5 MG tablet Commonly known as: KLONOPIN Take 1 tablet (0.5 mg total) by mouth at bedtime.   ibuprofen 200 MG tablet Commonly known as: ADVIL Take 200 mg by mouth every 6 (six) hours as needed for mild pain.   pantoprazole 20 MG  tablet Commonly known as: PROTONIX TAKE 1 TABLET BY MOUTH  TWICE DAILY   QUEtiapine 25 MG tablet Commonly known as: SEROQUEL TAKE ONE-HALF TABLET BY  MOUTH THREE TIMES DAILY What changed:   how much to take  how to take this  when to take this  additional instructions   rOPINIRole 0.5 MG tablet Commonly known as: REQUIP Take 1 tablet (0.5 mg total) by mouth daily for 2 days. Start taking on: Jan 04, 2021   sertraline 50 MG tablet Commonly known as: ZOLOFT TAKE 1 TABLET BY MOUTH  DAILY   vitamin B-12 1000 MCG tablet Commonly known as: CYANOCOBALAMIN Take 1 tablet (1,000 mcg total) by mouth daily.      Allergies  Allergen Reactions  . Penicillins Hives    Has patient had Tyrion Glaude PCN reaction causing immediate rash, facial/tongue/throat swelling, SOB or lightheadedness with hypotension: Yes Has patient had Erinn Mendosa PCN reaction causing severe rash involving mucus membranes or skin necrosis: No Has patient had Curties Conigliaro PCN reaction that required  hospitalization: No Has patient had Miyonna Ormiston PCN reaction occurring within the last 10 years: No If all of the above answers are "NO", then may proceed with Cephalosporin use. *per patient has tolerated keflex before   . Prednisone     Swelling, emotionally volatile   . Latex Rash    Follow-up Information    [facility] Follow [facility].   Specialty: Rehabilitation Why: they will call you for appt. Contact information: [address] [id number] mc [place] [address] [phone]               The results of significant diagnostics from this hospitalization (including imaging, microbiology, ancillary and laboratory) are listed below for reference.    Significant Diagnostic Studies: DG Chest 2 View  Result Date: 01/01/2021 CLINICAL DATA:  Altered mental status with multiple falls. EXAM: CHEST - 2 VIEW COMPARISON:  May 07, 2018 FINDINGS: The heart size and mediastinal contours are within normal limits. Both lungs are clear. Radiopaque surgical clips are seen overlying the cervical spine. The visualized skeletal structures are otherwise unremarkable. IMPRESSION: No active cardiopulmonary disease. Electronically Signed   By: Aram Candela M.D.   On: 01/01/2021 23:35   CT Head Wo Contrast  Result Date: 01/02/2021 CLINICAL DATA:  Delirium, altered mental status, multiple falls, EXAM: CT HEAD WITHOUT CONTRAST CT CERVICAL SPINE WITHOUT CONTRAST TECHNIQUE: Multidetector CT imaging of the head and cervical spine was performed following the standard protocol without intravenous contrast. Multiplanar CT image reconstructions of the cervical spine were also generated. COMPARISON:  None. FINDINGS: CT HEAD FINDINGS Brain: Normal anatomic configuration. Parenchymal volume loss is commensurate with the patient's age. Moderate periventricular white matter changes are present likely reflecting the sequela of small vessel ischemia. No  abnormal intra or extra-axial mass lesion or fluid collection. No abnormal mass effect or midline shift. No evidence of acute intracranial hemorrhage or infarct. Ventricular size is normal. Cerebellum unremarkable. Vascular: No asymmetric hyperdense vasculature at the skull base. Skull: Intact Sinuses/Orbits: Paranasal sinuses are clear. Orbits are unremarkable. Other: Mastoid air cells and middle ear cavities are clear. CT CERVICAL SPINE FINDINGS Alignment: Anterior cervical discectomy infusion has been performed at C3-C7 with solid fusion of the involved segments with instrumentation noted at C4-C7. There is 2-3 mm anterolisthesis of C7 upon T1, likely degenerative in nature. Skull base and vertebrae: The craniocervical junction is unremarkable. The atlantodental interval is not widened. There is ankylosis of the Edelmiro Innocent left C2-3 facet joint.  There is no acute fracture of the cervical spine. Soft tissues and spinal canal: There is mild central canal stenosis at C3-4 secondary to posterior callus formation resulting in effacement of the anterior canal space and mild flattening of the thecal sac. The AP diameter of the spinal canal at this level is 8-9 mm at minimum. The spinal canal is otherwise widely patent. The prevertebral soft tissues are not thickened. No paraspinal fluid collections are identified. No canal hematoma is identified. Disc levels: There is intervertebral disc space narrowing and endplate remodeling at the residual C7-T1 intervertebral disc space in keeping with moderate degenerative disc disease. Mild endplate changes noted at C2-3 are in keeping with mild degenerative disc disease at this level. Facet arthropathy results in multilevel mild neuroforaminal narrowing, best appreciated on the right at C4-5 and bilaterally at C5-6. Upper chest: Unremarkable Other: None IMPRESSION: No acute intracranial abnormality.  No calvarial fracture. Moderate periventricular white matter changes most in keeping with  small vessel ischemia. No acute fracture or listhesis of the cervical spine. Anterior cervical discectomy and fusion with instrumentation C3-C7. Mild central canal stenosis C3-4. Mild multilevel facet arthrosis resulting in mild neuroforaminal narrowing as described above. Electronically Signed   By: Helyn Numbers MD   On: 01/02/2021 00:19   CT Cervical Spine Wo Contrast  Result Date: 01/02/2021 CLINICAL DATA:  Delirium, altered mental status, multiple falls, EXAM: CT HEAD WITHOUT CONTRAST CT CERVICAL SPINE WITHOUT CONTRAST TECHNIQUE: Multidetector CT imaging of the head and cervical spine was performed following the standard protocol without intravenous contrast. Multiplanar CT image reconstructions of the cervical spine were also generated. COMPARISON:  None. FINDINGS: CT HEAD FINDINGS Brain: Normal anatomic configuration. Parenchymal volume loss is commensurate with the patient's age. Moderate periventricular white matter changes are present likely reflecting the sequela of small vessel ischemia. No abnormal intra or extra-axial mass lesion or fluid collection. No abnormal mass effect or midline shift. No evidence of acute intracranial hemorrhage or infarct. Ventricular size is normal. Cerebellum unremarkable. Vascular: No asymmetric hyperdense vasculature at the skull base. Skull: Intact Sinuses/Orbits: Paranasal sinuses are clear. Orbits are unremarkable. Other: Mastoid air cells and middle ear cavities are clear. CT CERVICAL SPINE FINDINGS Alignment: Anterior cervical discectomy infusion has been performed at C3-C7 with solid fusion of the involved segments with instrumentation noted at C4-C7. There is 2-3 mm anterolisthesis of C7 upon T1, likely degenerative in nature. Skull base and vertebrae: The craniocervical junction is unremarkable. The atlantodental interval is not widened. There is ankylosis of the Sonna Lipsky left C2-3 facet joint. There is no acute fracture of the cervical spine. Soft tissues and spinal  canal: There is mild central canal stenosis at C3-4 secondary to posterior callus formation resulting in effacement of the anterior canal space and mild flattening of the thecal sac. The AP diameter of the spinal canal at this level is 8-9 mm at minimum. The spinal canal is otherwise widely patent. The prevertebral soft tissues are not thickened. No paraspinal fluid collections are identified. No canal hematoma is identified. Disc levels: There is intervertebral disc space narrowing and endplate remodeling at the residual C7-T1 intervertebral disc space in keeping with moderate degenerative disc disease. Mild endplate changes noted at C2-3 are in keeping with mild degenerative disc disease at this level. Facet arthropathy results in multilevel mild neuroforaminal narrowing, best appreciated on the right at C4-5 and bilaterally at C5-6. Upper chest: Unremarkable Other: None IMPRESSION: No acute intracranial abnormality.  No calvarial fracture. Moderate periventricular white matter changes  most in keeping with small vessel ischemia. No acute fracture or listhesis of the cervical spine. Anterior cervical discectomy and fusion with instrumentation C3-C7. Mild central canal stenosis C3-4. Mild multilevel facet arthrosis resulting in mild neuroforaminal narrowing as described above. Electronically Signed   By: Helyn NumbersAshesh  Parikh MD   On: 01/02/2021 00:19   DG Hip Unilat W or Wo Pelvis 2-3 Views Right  Result Date: 01/01/2021 CLINICAL DATA:  Status post fall. EXAM: DG HIP (WITH OR WITHOUT PELVIS) 2-3V RIGHT COMPARISON:  None. FINDINGS: There is no evidence of hip fracture or dislocation. Degenerative changes seen involving both hips in the form of joint space narrowing and acetabular sclerosis. IMPRESSION: No acute osseous abnormalities. Electronically Signed   By: Aram Candelahaddeus  Houston M.D.   On: 01/01/2021 23:37    Microbiology: Recent Results (from the past 240 hour(s))  Resp Panel by RT-PCR (Flu Clance Baquero&B, Covid)  Nasopharyngeal Swab     Status: None   Collection Time: 01/01/21 10:45 PM   Specimen: Nasopharyngeal Swab; Nasopharyngeal(NP) swabs in vial transport medium  Result Value Ref Range Status   SARS Coronavirus 2 by RT PCR NEGATIVE NEGATIVE Final    Comment: (NOTE) SARS-CoV-2 target nucleic acids are NOT DETECTED.  The SARS-CoV-2 RNA is generally detectable in upper respiratory specimens during the acute phase of infection. The lowest concentration of SARS-CoV-2 viral copies this assay can detect is 138 copies/mL. Harlis Champoux negative result does not preclude SARS-Cov-2 infection and should not be used as the sole basis for treatment or other patient management decisions. Parker Wherley negative result may occur with  improper specimen collection/handling, submission of specimen other than nasopharyngeal swab, presence of viral mutation(s) within the areas targeted by this assay, and inadequate number of viral copies(<138 copies/mL). Jameika Kinn negative result must be combined with clinical observations, patient history, and epidemiological information. The expected result is Negative.  Fact Sheet for Patients:  BloggerCourse.comhttps://www.fda.gov/media/152166/download  Fact Sheet for Healthcare Providers:  SeriousBroker.ithttps://www.fda.gov/media/152162/download  This test is no t yet approved or cleared by the [place] FDA and  has been authorized for detection and/or diagnosis of SARS-CoV-2 by FDA under an Emergency Use Authorization (EUA). This EUA will remain  in effect (meaning this test can be used) for the duration of the COVID-19 declaration under Section 564(b)(1) of the Act, 21 U.S.C.section 360bbb-3(b)(1), unless the authorization is terminated  or revoked sooner.       Influenza Morse Brueggemann by PCR NEGATIVE NEGATIVE Final   Influenza B by PCR NEGATIVE NEGATIVE Final    Comment: (NOTE) The Xpert Xpress SARS-CoV-2/FLU/RSV plus assay is intended as an aid in the diagnosis of influenza from Nasopharyngeal swab specimens and should not be  used as Larren Copes sole basis for treatment. Nasal washings and aspirates are unacceptable for Xpert Xpress SARS-CoV-2/FLU/RSV testing.  Fact Sheet for Patients: BloggerCourse.comhttps://www.fda.gov/media/152166/download  Fact Sheet for Healthcare Providers: SeriousBroker.ithttps://www.fda.gov/media/152162/download  This test is not yet approved or cleared by the [place] FDA and has been authorized for detection and/or diagnosis of SARS-CoV-2 by FDA under an Emergency Use Authorization (EUA). This EUA will remain in effect (meaning this test can be used) for the duration of the COVID-19 declaration under Section 564(b)(1) of the Act, 21 U.S.C. section 360bbb-3(b)(1), unless the authorization is terminated or revoked.  Performed at [facility], [address] [place], [place] [id number]   Urine culture     Status: Abnormal   Collection Time: 01/02/21  1:00 AM   Specimen: Urine, Clean Catch  Result Value Ref Range Status  Specimen Description   Final    URINE, CLEAN CATCH Performed at [facility], [address] [place], [place] [address]    Special Requests   Final    NONE Performed at [facility], [address] [place], [place] [address]    Culture MULTIPLE SPECIES PRESENT, SUGGEST RECOLLECTION (Talya Quain)  Final   Report Status 01/03/2021 FINAL  Final     Labs: Basic Metabolic Panel: Recent Labs  Lab 01/01/21 2244 01/02/21 0447 01/03/21 0445  NA 139 140 141  K 3.5 3.5 3.6  CL 105 106 108  CO2 28 27 26   GLUCOSE 124* 125* 111*  BUN 20 16 18   CREATININE 0.89 0.86 0.87  CALCIUM 9.3 9.4 9.3  MG  --   --  2.2  PHOS  --   --  3.7   Liver Function Tests: Recent Labs  Lab 01/01/21 2244 01/03/21 0445  AST 18 17  ALT <5 <5  ALKPHOS 84 90  BILITOT 0.9 0.9  PROT 7.7 7.3  ALBUMIN 4.2 4.0   No results for input(s): LIPASE, AMYLASE in the last 168 hours. Recent Labs  Lab 01/01/21 2244 01/03/21 0802  AMMONIA <9* 19   CBC: Recent Labs   Lab 01/01/21 2244 01/02/21 0447 01/03/21 0445  WBC 6.6 7.1 5.2  NEUTROABS 4.7  --  3.6  HGB 11.4* 11.9* 11.5*  HCT 34.7* 35.8* 35.5*  MCV 91.6 89.9 91.7  PLT 191 190 170   Cardiac Enzymes: No results for input(s): CKTOTAL, CKMB, CKMBINDEX, TROPONINI in the last 168 hours. BNP: BNP (last 3 results) No results for input(s): BNP in the last 8760 hours.  ProBNP (last 3 results) No results for input(s): PROBNP in the last 8760 hours.  CBG: No results for input(s): GLUCAP in the last 168 hours.     Signed:  01/04/21 MD.  [facility] 01/03/2021, 7:56 PM

## 2021-01-03 NOTE — TOC Transition Note (Signed)
Transition of Care [facility]) - CM/SW Discharge Note   Patient Details  Name: CHAUNTAE HULTS MRN: [id number] Date of Birth: 08/19/1945  [facility] CM/SW Contact:  Lanier Clam, RN Phone Number: 01/03/2021, 3:26 PM   Clinical Narrative: Spouse declines SNF.  I spoke to spouse in rm about no HHC agency to accept. He understands & says there will be family in & out-has 2 grandaughters to asst. She has rw already. There are no wounds for nsg, & he gives her her meds. He will f/u with the pcp within 1 wk & ask about HHC if needed, or contact the insurance.MD/Nsg/supv updated.  3:38p- AFter conversation w/supv-recc otpt PT-Spuse in agreement-[facility] neuro rehab referral sent. MD updated.       Final next level of care: Home/Self Care Barriers to Discharge: No Home Care Agency will accept this patient   Patient Goals and CMS Choice Patient states their goals for this hospitalization and ongoing recovery are:: go home CMS Medicare.gov Compare Post Acute Care list provided to:: Patient Represenative (must comment) Choice offered to / list presented to : Spouse  Discharge Placement                       Discharge Plan and Services   Discharge Planning Services: CM Consult                                 Social Determinants of Health (SDOH) Interventions     Readmission Risk Interventions No flowsheet data found.

## 2021-01-07 ENCOUNTER — Encounter (HOSPITAL_COMMUNITY): Payer: Self-pay

## 2021-01-07 ENCOUNTER — Emergency Department (HOSPITAL_COMMUNITY)
Admission: EM | Admit: 2021-01-07 | Discharge: 2021-01-09 | Disposition: A | Discharge status: Home / Self Care | Payer: Medicare Other | Attending: Emergency Medicine | Admitting: Emergency Medicine

## 2021-01-07 ENCOUNTER — Emergency Department (HOSPITAL_COMMUNITY): Payer: Medicare Other

## 2021-01-07 ENCOUNTER — Other Ambulatory Visit: Payer: Self-pay

## 2021-01-07 DIAGNOSIS — G20 Parkinson's disease: Secondary | ICD-10-CM | POA: Insufficient documentation

## 2021-01-07 DIAGNOSIS — I10 Essential (primary) hypertension: Secondary | ICD-10-CM | POA: Insufficient documentation

## 2021-01-07 DIAGNOSIS — Z79899 Other long term (current) drug therapy: Secondary | ICD-10-CM | POA: Insufficient documentation

## 2021-01-07 DIAGNOSIS — Z20822 Contact with and (suspected) exposure to covid-19: Secondary | ICD-10-CM | POA: Insufficient documentation

## 2021-01-07 DIAGNOSIS — Z743 Need for continuous supervision: Secondary | ICD-10-CM | POA: Diagnosis not present

## 2021-01-07 DIAGNOSIS — R531 Weakness: Secondary | ICD-10-CM | POA: Diagnosis not present

## 2021-01-07 DIAGNOSIS — F600 Paranoid personality disorder: Secondary | ICD-10-CM | POA: Insufficient documentation

## 2021-01-07 DIAGNOSIS — Z91040 Latex allergy status: Secondary | ICD-10-CM | POA: Diagnosis not present

## 2021-01-07 DIAGNOSIS — F0281 Dementia in other diseases classified elsewhere with behavioral disturbance: Secondary | ICD-10-CM | POA: Insufficient documentation

## 2021-01-07 LAB — URINALYSIS, ROUTINE W REFLEX MICROSCOPIC
Bacteria, UA: NONE SEEN
Bilirubin Urine: NEGATIVE
Glucose, UA: NEGATIVE mg/dL
Hgb urine dipstick: NEGATIVE
Ketones, ur: 5 mg/dL — AB
Nitrite: NEGATIVE
Protein, ur: NEGATIVE mg/dL
Specific Gravity, Urine: 1.014 (ref 1.005–1.030)
pH: 6 (ref 5.0–8.0)

## 2021-01-07 MED ORDER — PANTOPRAZOLE SODIUM 20 MG PO TBEC
20.0000 mg | DELAYED_RELEASE_TABLET | Freq: Two times a day (BID) | ORAL | Status: DC
  Administered 2021-01-07 – 2021-01-09 (×4): 20 mg via ORAL
  Filled 2021-01-07 (×4): qty 1

## 2021-01-07 MED ORDER — QUETIAPINE FUMARATE 25 MG PO TABS
12.5000 mg | ORAL_TABLET | Freq: Three times a day (TID) | ORAL | Status: DC
  Administered 2021-01-07 – 2021-01-09 (×5): 12.5 mg via ORAL
  Filled 2021-01-07 (×5): qty 1

## 2021-01-07 MED ORDER — CARBIDOPA-LEVODOPA ER 25-100 MG PO TBCR: 2.0000 | EXTENDED_RELEASE_TABLET | Freq: Every day | ORAL | Status: DC

## 2021-01-07 MED ORDER — ACETAMINOPHEN 325 MG PO TABS: 650.0000 mg | ORAL_TABLET | ORAL | Status: DC | PRN

## 2021-01-07 MED ORDER — VITAMIN B-12 1000 MCG PO TABS: 1000.0000 ug | ORAL_TABLET | Freq: Every day | ORAL | Status: DC

## 2021-01-07 MED ORDER — CARBIDOPA-LEVODOPA 25-100 MG PO TABS
1.5000 | ORAL_TABLET | Freq: Two times a day (BID) | ORAL | Status: DC
  Administered 2021-01-08: 1.5 via ORAL
  Filled 2021-01-07 (×2): qty 1.5

## 2021-01-07 MED ORDER — ONDANSETRON HCL 4 MG PO TABS: 4.0000 mg | ORAL_TABLET | Freq: Three times a day (TID) | ORAL | Status: DC | PRN

## 2021-01-07 MED ORDER — SERTRALINE HCL 50 MG PO TABS
50.0000 mg | ORAL_TABLET | Freq: Every day | ORAL | Status: DC
  Administered 2021-01-08 – 2021-01-09 (×2): 50 mg via ORAL
  Filled 2021-01-07 (×3): qty 1

## 2021-01-07 MED ORDER — CLONAZEPAM 0.5 MG PO TABS
0.5000 mg | ORAL_TABLET | Freq: Every day | ORAL | Status: DC
  Administered 2021-01-07 – 2021-01-08 (×2): 0.5 mg via ORAL
  Filled 2021-01-07 (×2): qty 1

## 2021-01-07 MED ORDER — ATORVASTATIN CALCIUM 10 MG PO TABS
20.0000 mg | ORAL_TABLET | Freq: Every day | ORAL | Status: DC
  Administered 2021-01-08 – 2021-01-09 (×2): 20 mg via ORAL
  Filled 2021-01-07 (×2): qty 2

## 2021-01-07 NOTE — ED Triage Notes (Signed)
Patient come from home. Patient has sundown, has some aggressive behavior so family was told by PCP to bring to her to the ED for evaluation
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
## 2021-01-08 ENCOUNTER — Emergency Department (HOSPITAL_COMMUNITY): Payer: Medicare Other

## 2021-01-08 LAB — COMPREHENSIVE METABOLIC PANEL
ALT: <5 U/L (ref 0–44)
AST: 25 U/L (ref 15–41)
Albumin: 3.8 g/dL (ref 3.5–5.0)
Alkaline Phosphatase: 78 U/L (ref 38–126)
Anion gap: 4 — ABNORMAL LOW (ref 5–15)
BUN: 16 mg/dL (ref 8–23)
CO2: 31 mmol/L (ref 22–32)
Calcium: 9.4 mg/dL (ref 8.9–10.3)
Chloride: 106 mmol/L (ref 98–111)
Creatinine, Ser: 0.78 mg/dL (ref 0.44–1.00)
GFR, Estimated: >60 mL/min (ref >60)
Glucose, Bld: 110 mg/dL — ABNORMAL HIGH (ref 70–99)
Potassium: 3.5 mmol/L (ref 3.5–5.1)
Sodium: 141 mmol/L (ref 135–145)
Total Bilirubin: 1.2 mg/dL (ref 0.3–1.2)
Total Protein: 7.2 g/dL (ref 6.5–8.1)

## 2021-01-08 LAB — CBC WITH DIFFERENTIAL/PLATELET
Abs Immature Granulocytes: 0.01 10*3/uL (ref 0.00–0.07)
Basophils Absolute: 0 10*3/uL (ref 0.0–0.1)
Basophils Relative: 1 %
Eosinophils Absolute: 0.2 10*3/uL (ref 0.0–0.5)
Eosinophils Relative: 4 %
HCT: 33.9 % — ABNORMAL LOW (ref 36.0–46.0)
Hemoglobin: 11.1 g/dL — ABNORMAL LOW (ref 12.0–15.0)
Immature Granulocytes: 0 %
Lymphocytes Relative: 20 %
Lymphs Abs: 1 10*3/uL (ref 0.7–4.0)
MCH: 29.9 pg (ref 26.0–34.0)
MCHC: 32.7 g/dL (ref 30.0–36.0)
MCV: 91.4 fL (ref 80.0–100.0)
Monocytes Absolute: 0.4 10*3/uL (ref 0.1–1.0)
Monocytes Relative: 8 %
Neutro Abs: 3.5 10*3/uL (ref 1.7–7.7)
Neutrophils Relative %: 67 %
Platelets: 186 10*3/uL (ref 150–400)
RBC: 3.71 MIL/uL — ABNORMAL LOW (ref 3.87–5.11)
RDW: 12.7 % (ref 11.5–15.5)
WBC: 5.1 10*3/uL (ref 4.0–10.5)
nRBC: 0 % (ref 0.0–0.2)

## 2021-01-08 LAB — RESP PANEL BY RT-PCR (FLU A&B, COVID) ARPGX2
Influenza A by PCR: NEGATIVE
Influenza B by PCR: NEGATIVE
SARS Coronavirus 2 by RT PCR: NEGATIVE

## 2021-01-08 LAB — TROPONIN I (HIGH SENSITIVITY): Troponin I (High Sensitivity): 5 ng/L (ref <18)

## 2021-01-08 MED ORDER — CARBIDOPA-LEVODOPA 25-250 MG PO TABS
2.0000 | ORAL_TABLET | Freq: Every day | ORAL | Status: DC
  Administered 2021-01-09: 2 via ORAL
  Filled 2021-01-08: qty 2

## 2021-01-08 MED ORDER — QUETIAPINE FUMARATE 25 MG PO TABS: ORAL_TABLET | ORAL | 3 refills | Status: DC

## 2021-01-08 MED ORDER — VITAMIN B-12 1000 MCG PO TABS
1000.0000 ug | ORAL_TABLET | Freq: Every day | ORAL | Status: DC
  Administered 2021-01-08 – 2021-01-09 (×2): 1000 ug via ORAL
  Filled 2021-01-08 (×2): qty 1

## 2021-01-08 MED ORDER — CARBIDOPA-LEVODOPA 25-100 MG PO TABS
1.5000 | ORAL_TABLET | Freq: Two times a day (BID) | ORAL | Status: DC
  Administered 2021-01-09: 1.5 via ORAL
  Filled 2021-01-08 (×3): qty 1.5

## 2021-01-08 MED ORDER — CARBIDOPA-LEVODOPA ER 25-100 MG PO TBCR
2.0000 | EXTENDED_RELEASE_TABLET | Freq: Every day | ORAL | Status: DC
  Administered 2021-01-08: 2 via ORAL
  Filled 2021-01-08: qty 2

## 2021-01-08 NOTE — Progress Notes (Signed)
..   Transition of Care [facility]) - Emergency Department Mini Assessment   Patient Details  Name: Christina Stevenson MRN: [id number] Date of Birth: 1946-03-27  Transition of Care [facility]) CM/SW Contact:    Lossie Faes Tarpley-Carter, LCSWA Phone Number: 01/08/2021, 12:03 PM   Clinical Narrative: TOC CSW spoke with pts husband/George Kellison [phone].  Greggory Stallion would like a referral to [facility] to assist with finding a future placement for wife.  Kaegan Hettich Tarpley-Carter, MSW, LCSW-A Pronouns:  She, Her, Hers                  [facility] ED Transitions of CareClinical Social Worker [email or web address] [phone]   ED Mini Assessment: What brought you to the Emergency Department? : Altered Mental Status  Barriers to Discharge: No Barriers Identified     Means of departure: Not know  Interventions which prevented an admission or readmission: SNF Placement    Patient Contact and Communications Key Contact 1: Beckey Rutter   Spoke with: Beckey Rutter Contact Date: 01/08/21,     Contact Phone Number: [phone] Call outcome: Greggory Stallion would like a referral to [facility].      Choice offered to / list presented to : NA  Admission diagnosis:  Dementia  Patient Active Problem List   Diagnosis Date Noted  . Acute encephalopathy 01/02/2021  . Acute metabolic encephalopathy 01/02/2021  . Globus sensation 05/13/2020  . Chest tightness 02/01/2020  . Insomnia 05/27/2018  . Hallucinations 05/27/2018  . Constipation 07/01/2017  . Nocturnal leg cramps 11/19/2016  . UTI (urinary tract infection) 11/12/2016  . Cervical spondylosis without myelopathy 08/08/2015  . Headache 02/22/2015  . Gait abnormality 11/23/2012  . Memory loss 10/05/2012  . 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 Dysphagia 02/11/2011  . Preventative health care 01/14/2011  . Depression 08/18/2009  . HLD (hyperlipidemia) 09/16/2006  . Essential hypertension  09/16/2006  . GERD 09/16/2006   PCP:  Versie Starks, DO Pharmacy:   [facility] [address] [facility] - [place], [place] - [address] [place] [address] Phone: [phone] Fax: [phone]  [facility] [place], [place] - [address] [place], [address] [place], [address] [place] [address] Phone: [phone] Fax: [phone]  [id number] [place], [place] - [address] AT [facility] OF [facility] [address] [place] [address] Phone: [phone] Fax: [phone]
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
## 2021-01-08 NOTE — BH Assessment (Signed)
Clinician made contact with pt's nurse, Hala RN, in an effort to complete pt's MH Assessment. Pt's nurse shared that pt is currently asleep and unable to participate at this time.  Pt's nurse then contacted the EDP, Dr. Estell Harpin, to determine if the [facility] Assessment for pt is still necessary due to pt now being a "boarder." It was determined that the [facility] Assessment is not necessary at this time and will be d/c.

## 2021-01-08 NOTE — BH Assessment (Signed)
Sent patient's nurse Caleen Jobs, RN), a secure chat requesting TTS machine to be placed in patient's room.

## 2021-01-08 NOTE — Addendum Note (Signed)
Addended by: York Spaniel on: 01/08/2021 04:50 PM   Modules accepted: Orders

## 2021-01-08 NOTE — BH Assessment (Signed)
Pt is asleep and her husband has left.  TTS to see patient when she is able to participate in assessmetn.

## 2021-01-08 NOTE — Telephone Encounter (Signed)
I called the husband.  The patient is sundowning, she does well in the morning, but as the evening InSu should comes confused and agitated.  We will need to go up on the Seroquel taking 1/2 tablet in the morning, 1 full tab around 4 PM and 1 full tablet at bedtime.  Currently, the patient is getting psychiatric evaluation in the emergency room.  When the patient left this afternoon, she was started to get confused, staring off her close and pulling the wires off of her.

## 2021-01-09 DIAGNOSIS — I10 Essential (primary) hypertension: Secondary | ICD-10-CM | POA: Diagnosis not present

## 2021-01-09 LAB — URINE CULTURE: Culture: NO GROWTH

## 2021-01-09 NOTE — ED Notes (Signed)
Husband remains at the bedside- assisting with meal tray.  The patient appears pleasant and cooperative at this time

## 2021-01-09 NOTE — ED Provider Notes (Addendum)
Emergency Medicine Observation Re-evaluation Note  Christina Stevenson is a 75 y.o. female, seen on rounds today.  Pt initially presented to the ED for complaints of beh and Dementia Currently, the patient is sleeping.  Physical Exam  BP 112/63   Pulse 61   Temp 98.1 F (36.7 C) (Oral)   Resp 16   SpO2 94%  Physical Exam General: sleeping NAD  Cardiac: regular rhythm Lungs: no respiratory distress and no tachypnea Psych: demented, but not currently agitated.  ED Course / MDM  EKG:EKG Interpretation  Date/Time:  Monday Jan 07 2021 23:27:18 EDT Ventricular Rate:  74 PR Interval:    QRS Duration: 128 QT Interval:  421 QTC Calculation: 468 R Axis:   102 Text Interpretation: Normal sinus rhythm Nonspecific intraventricular conduction delay Minimal ST depression Borderline ST elevation, anterior leads Confirmed by Alona Bene [phone]) on 01/08/2021 9:40:10 AM   I have reviewed the labs performed to date as well as medications administered while in observation.  Recent changes in the last 24 hours include none.  Plan  Current plan is for pt no longer felt to need TTS but needs placement.  Pt will need PT eval.  TOC looking for placement.  Husband and pt agreeable to this plan.   2:13 PM Pt evaluated and felt pt could do outpt neurorehab PT.  Husband desires to take the patient home. Gwyneth Sprout, MD 01/09/21 [id number]    Gwyneth Sprout, MD 01/09/21 [phone]

## 2021-01-09 NOTE — ED Notes (Signed)
Patient appears pleasant this morning and cooperative.  Her husband is at the bedside

## 2021-01-09 NOTE — ED Notes (Signed)
V/O to d/c TTS as pt denies SI/HI/does not appear to be having hallucinations and has not been aggressive since admission

## 2021-01-09 NOTE — ED Notes (Signed)
Pt given meal tray.

## 2021-01-09 NOTE — ED Notes (Signed)
Husband still at bedside, assisting with lunch tray.

## 2021-01-09 NOTE — Evaluation (Signed)
Physical Therapy Evaluation Patient Details Name: Christina Stevenson MRN: [id number] DOB: 1946/07/28 Today's Date: 01/09/2021   History of Present Illness  75 y.o. female with history of Parkinson's disease with hyperlipidemia has been having increasing confusion delusions and hallucinations recently. Recent admission 01/01/21- 01/03/21 with acute encephalopathy.  Clinical Impression  Pt admitted with above diagnosis. Pt ambulated 50' with cane and hand held assist without loss of balance. Pt's husband stated he can provide 24/7 assist at home and would like to have pt go to outpatient neurorehab for PT.  Pt currently with functional limitations due to the deficits listed below (see PT Problem List). Pt will benefit from skilled PT to increase their independence and safety with mobility to allow discharge to the venue listed below.       Follow Up Recommendations Outpatient PT (neurorehab); assist for mobility    Equipment Recommendations  None recommended by PT    Recommendations for Other Services       Precautions / Restrictions Precautions Precautions: Fall Precaution Comments: 4-5 falls at home in the past year Restrictions Weight Bearing Restrictions: No      Mobility  Bed Mobility Overal bed mobility: Needs Assistance Bed Mobility: Supine to Sit     Supine to sit: Mod assist     General bed mobility comments: multimodal cues for technique, assist for upper body and scooting to EOB    Transfers Overall transfer level: Needs assistance Equipment used: Straight cane;1 person hand held assist Transfers: Sit to/from Stand Sit to Stand: Mod assist         General transfer comment: mod A to power up, increased time, VCs for technique  Ambulation/Gait Ambulation/Gait assistance: Min assist Gait Distance (Feet): 50 Feet Assistive device: 1 person hand held assist;Straight cane Gait Pattern/deviations: Step-through pattern;Decreased stride length Gait velocity: decr    General Gait Details: decreased velocity, VCs for encouragement, no loss of balance, husband present and provided encouragement  Stairs            Wheelchair Mobility    Modified Rankin (Stroke Patients Only)       Balance Overall balance assessment: Needs assistance Sitting-balance support: Bilateral upper extremity supported;Feet unsupported Sitting balance-Leahy Scale: Fair   Postural control: Posterior lean Standing balance support: Bilateral upper extremity supported Standing balance-Leahy Scale: Poor Standing balance comment: posterior lean initially upon standing                             Pertinent Vitals/Pain Pain Assessment: No/denies pain    Home Living Family/patient expects to be discharged to:: Private residence Living Arrangements: Other relatives;Spouse/significant other Available Help at Discharge: Family;Available 24 hours/day Type of Home: House Home Access: Stairs to enter;Ramped entrance Entrance Stairs-Rails: Right Entrance Stairs-Number of Steps: 8 Home Layout: Able to live on main level with bedroom/bathroom Home Equipment: Walker - 4 wheels;Cane - single point;Bedside commode;Shower seat Additional Comments: husband is home 24/7, thinking to bring in private aide    Prior Function Level of Independence: Needs assistance   Gait / Transfers Assistance Needed: granddaughter reports pt ambulates with assist and SPC or rollator  ADL's / Homemaking Assistance Needed: requiring assist for feeding and for bathing/dressing; pt steps into tub and sits on shower seat        Hand Dominance        Extremity/Trunk Assessment   Upper Extremity Assessment Upper Extremity Assessment: Overall WFL for tasks assessed    Lower Extremity Assessment  Lower Extremity Assessment: Overall WFL for tasks assessed (B knee ext +4/5)    Cervical / Trunk Assessment Cervical / Trunk Assessment: Normal  Communication   Communication: No  difficulties  Cognition Arousal/Alertness: Awake/alert Behavior During Therapy: WFL for tasks assessed/performed Overall Cognitive Status: History of cognitive impairments - at baseline                                        General Comments      Exercises     Assessment/Plan    PT Assessment Patient needs continued PT services  PT Problem List Decreased strength;Decreased activity tolerance;Decreased balance;Decreased knowledge of use of DME;Decreased mobility;Decreased cognition       PT Treatment Interventions DME instruction;Gait training;Balance training;Therapeutic exercise;Functional mobility training;Patient/family education;Therapeutic activities    PT Goals (Current goals can be found in the Care Plan section)  Acute Rehab PT Goals Patient Stated Goal: walk farther PT Goal Formulation: With patient/family Time For Goal Achievement: 01/23/21 Potential to Achieve Goals: Fair    Frequency Min 3X/week   Barriers to discharge        Co-evaluation               AM-PAC PT "6 Clicks" Mobility  Outcome Measure Help needed turning from your back to your side while in a flat bed without using bedrails?: A Lot Help needed moving from lying on your back to sitting on the side of a flat bed without using bedrails?: A Lot Help needed moving to and from a bed to a chair (including a wheelchair)?: A Lot Help needed standing up from a chair using your arms (e.g., wheelchair or bedside chair)?: A Lot Help needed to walk in hospital room?: A Lot Help needed climbing 3-5 steps with a railing? : A Lot 6 Click Score: 12    End of Session Equipment Utilized During Treatment: Gait belt Activity Tolerance: Patient tolerated treatment well Patient left: in chair;with call bell/phone within reach;with family/visitor present Nurse Communication: Mobility status (recommended Stedy for return to bed) PT Visit Diagnosis: Other abnormalities of gait and mobility  (R26.89)    Time: 6301-6010 PT Time Calculation (min) (ACUTE ONLY): 25 min   Charges:   PT Evaluation $PT Eval Moderate Complexity: 1 Mod          Tamala Ser PT 01/09/2021  Acute Rehabilitation Services Pager [phone] Office [phone]

## 2021-01-24 ENCOUNTER — Other Ambulatory Visit: Payer: Self-pay | Admitting: Internal Medicine

## 2021-01-24 ENCOUNTER — Other Ambulatory Visit: Payer: Self-pay | Admitting: Emergency Medicine

## 2021-01-24 ENCOUNTER — Other Ambulatory Visit: Payer: Self-pay | Admitting: Neurology

## 2021-01-24 DIAGNOSIS — K219 Gastro-esophageal reflux disease without esophagitis: Secondary | ICD-10-CM

## 2021-01-31 ENCOUNTER — Encounter: Payer: Self-pay | Admitting: *Deleted

## 2021-02-05 ENCOUNTER — Other Ambulatory Visit: Payer: Self-pay | Admitting: Neurology

## 2021-02-19 ENCOUNTER — Encounter: Payer: Self-pay | Admitting: *Deleted

## 2021-03-02 ENCOUNTER — Other Ambulatory Visit: Payer: Self-pay | Admitting: Neurology

## 2021-03-04 ENCOUNTER — Ambulatory Visit: Payer: Medicare Other | Admitting: Neurology

## 2021-03-04 ENCOUNTER — Encounter: Payer: Self-pay | Admitting: Neurology

## 2021-03-04 VITALS — BP 124/64 | HR 82 | Ht 60.0 in | Wt 146.5 lb

## 2021-03-04 DIAGNOSIS — G249 Dystonia, unspecified: Secondary | ICD-10-CM

## 2021-03-04 DIAGNOSIS — R413 Other amnesia: Secondary | ICD-10-CM | POA: Diagnosis not present

## 2021-03-04 DIAGNOSIS — G20 Parkinson's disease: Secondary | ICD-10-CM | POA: Diagnosis not present

## 2021-03-04 DIAGNOSIS — R269 Unspecified abnormalities of gait and mobility: Secondary | ICD-10-CM

## 2021-03-04 MED ORDER — QUETIAPINE FUMARATE 25 MG PO TABS: ORAL_TABLET | ORAL | 3 refills | Status: DC

## 2021-03-04 MED ORDER — CARBIDOPA-LEVODOPA ER 50-200 MG PO TBCR: 1.0000 | EXTENDED_RELEASE_TABLET | Freq: Three times a day (TID) | ORAL | 1 refills | Status: DC

## 2021-03-04 NOTE — Progress Notes (Signed)
Reason for visit: Parkinson's disease, dementia  Christina Stevenson is an 75 y.o. female  History of present illness:  Ms. Christina Stevenson is a 75 year old right-handed white female with a history of Parkinson's disease and dementia.  The patient has had increasing issues with hallucinations, confusion, and a decline in overall functional level.  She comes in today with her husband.  She was in the hospital on 17 May and also on 23 May with increased confusion and agitation.  The patient has been tapered off of her Requip and her donepezil.  The patient is on Sinemet 25/100 mg tablets taking 2 in the morning and 1.5 in the evening and midday.  The patient takes Sinemet CR 25/100 mg in the evening.  She gets to sleep around 8 PM, she will wake up about 3:30 in the morning to use the bathroom, she may have difficulty getting back into bed.  She usually can get back to sleep.  She has been doing better in this regard.  Her best functional time is between around 9 or 10 in the morning to about 1 or 2 in the afternoon, after that, she becomes increasingly agitated.  The patient complains of mid and low back pain.  She has had occasional falls when she tries to go outside on uneven ground.  She uses a walker or cane for ambulation.  She returns for further evaluation.  She requires assistance with bathing and dressing.  Past Medical History:  Diagnosis Date   Allergic rhinitis 09/16/2006   Qualifier: Diagnosis of  By: Ardyth Harps MD, Estela     Allergic rhinitis, cause unspecified    Anxiety    BACK PAIN 07/09/2010   Qualifier: Diagnosis of  By: Coralee Pesa MD, Levada Schilling    Bundle branch block, unspecified    Cervical spondylosis without myelopathy 08/08/2015   Depression 08/2009   multiple stressors: including son who has suffered 2 CVA's and is in a rehab facility, financial difficulties, loss of pets, etc.   Depression    Dermatitis    GERD (gastroesophageal reflux disease)    on bid omeprazole   Heart  palpitations 12/24/2011   HERPES ZOSTER 09/11/2009   Qualifier: Diagnosis of  By: Coralee Pesa MD, Joen Laura E    History of shingles    Hot flashes 12/24/2011   Hyperlipemia    Hypertension    Irritable bowel syndrome    Memory disturbance    Nocturnal leg cramps 11/19/2016   Nummular dermatitis 11/24/2017   Nummular dermatitis 11/24/2017   Treated with clobetasol ointment 0.05% BID as needed   Obesity    Osteoarthrosis, unspecified whether generalized or localized, unspecified site    Parkinson disease (HCC) 06/02/12   Probable; being follwed by Dr. 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 She has never used smokeless tobacco. She reports that she does not drink alcohol and does not use drugs.    Allergies  Allergen Reactions   Penicillins Hives    Has patient had a PCN reaction causing immediate rash, facial/tongue/throat swelling, SOB or lightheadedness with hypotension: Yes Has patient had a PCN reaction causing severe rash involving mucus membranes or skin necrosis: No Has patient had a PCN reaction that required hospitalization: No Has patient had a PCN reaction occurring within the last 10 years: No If all of the above answers are "NO", then may proceed with Cephalosporin use. *per patient has tolerated keflex before    Prednisone     Swelling, emotionally volatile     Latex Rash    Medications:  Prior to Admission medications   Medication Sig Start Date End Date Taking? Authorizing Provider  acetaminophen (TYLENOL) 500 MG tablet Take 1,000 mg by mouth as needed for moderate pain.   Yes [provider]  atorvastatin (LIPITOR) 20 MG tablet TAKE 1 TABLET BY MOUTH  DAILY Patient taking differently: Take 20 mg by mouth daily. 09/28/20  Yes Roylene Reason, MD  carbidopa-levodopa (SINEMET IR) 25-100 MG tablet 2 tablets in the morning, 1.5 tablets at midday and in the evening Patient taking differently: Take 1.5-2 tablets by mouth See admin instructions. 2 tablets in the morning, 1.5 tablets at midday and  1.5 tabs in the evening 06/28/20  Yes York Spaniel, MD  clonazePAM (KLONOPIN) 0.5 MG tablet TAKE 3 TABLETS BY MOUTH AT  BEDTIME 01/24/21  Yes York Spaniel, MD  Carbidopa-Levodopa ER (SINEMET CR) 25-100 MG tablet controlled release Take 1 tablet by mouth at bedtime. 10/17/20   York Spaniel, MD  ibuprofen (ADVIL,MOTRIN) 200 MG tablet Take 200 mg by mouth every 6 (six) hours as needed for mild pain.     [provider]  pantoprazole (PROTONIX) 20 MG tablet TAKE 1 TABLET BY MOUTH  TWICE DAILY 01/24/21   Dolan Amen, MD  QUEtiapine (SEROQUEL) 25 MG tablet 1/2 tablet in the morning, 1 tablet at 4 PM and at bedtime 01/08/21   York Spaniel, MD  sertraline (ZOLOFT) 50 MG tablet TAKE 1 TABLET BY MOUTH  DAILY Patient taking differently: Take 50 mg by mouth daily. 07/05/20   York Spaniel, MD    ROS:  Out of a complete 14 system review of symptoms, the patient complains only of the following symptoms, and all other reviewed systems are negative.  Confusion, hallucinations Walking difficulty Memory problems  Blood pressure 124/64, pulse 82, height 5' (1.524 m), weight 146 lb 8 oz (66.5 kg), SpO2 95 %.  Physical Exam  General: The patient is alert and cooperative at the time of the examination.  The patient is mildly  obese.  Skin: No significant peripheral edema is noted.   Neurologic Exam  Mental status: The patient is alert and oriented x 2, she is oriented to person and place.   Cranial nerves: Facial symmetry is present. Speech is normal, no aphasia or dysarthria is noted. Extraocular movements are full. Visual fields are full.  Masking the face is seen.  Motor: The patient has good strength in all 4 extremities.  Sensory examination: Soft touch sensation is symmetric on the face, arms, and legs.  Coordination: The patient has good finger-nose-finger and heel-to-shin bilaterally.  The patient does have apraxia with use of the extremities.  Some athetoid movements are noted.  Gait and station: The patient is able to walk with a walker.  She needs some assistance with standing but when she is up on her feet she is able to walk slowly with a walker with fairly good safety and good turns.  Reflexes: Deep tendon reflexes are symmetric.   CT head 01/08/21:  IMPRESSION: No acute intracranial abnormality. Stable periventricular white matter changes most in keeping with small vessel ischemia.  * CT scan images were reviewed online. I agree with the written report.    Assessment/Plan:  1.  Gait disturbance  2.  Parkinson's disease  3.  Memory disturbance, hallucinations  4.  Vitamin B12 deficiency  The patient was found to have a B12 deficiency in the hospital.  She is now on supplementation.  The patient will be taken off of her current Sinemet dosing, she will be switched to Sinemet CR 50/200 mg tablets taking 1 tablet 3 times daily.  She is off the Requip.  She will continue the Seroquel, a prescription was sent in.  The patient will take half of a Seroquel 25 mg tablet in the morning, and 1 around midday and in the evening.  She will follow-up here in 4 months.  Marlan Palau MD 03/04/2021 11:25 AM  [facility] [address] [place], [place]  [address]  Phone [phone] Fax [phone]

## 2021-03-11 ENCOUNTER — Telehealth: Payer: Self-pay | Admitting: Neurology

## 2021-03-11 MED ORDER — CARBIDOPA-LEVODOPA 25-100 MG PO TABS: 0.5000 | ORAL_TABLET | Freq: Three times a day (TID) | ORAL | 1 refills | Status: DC

## 2021-03-11 NOTE — Telephone Encounter (Signed)
I called the husband.  The patient has had some worsening of her ability to ambulate with the change in the Sinemet.  We will add back one half of a 25/100 mg IR Sinemet tablet 3 times a day with the CR.  The patient has had some issues feeling like she needs to void at night, this husband has placed her on antibiotics for a possible bladder infection, if this is not helpful, we may use Myrbetriq in the future to see if this helps her sensation of urgency at night.

## 2021-03-11 NOTE — Telephone Encounter (Signed)
Husband is asking for a call to discuss what can be done about pt waking up and from 3am to about 10 am pt having difficulty urinating, please call husband to discuss.

## 2021-03-11 NOTE — Telephone Encounter (Signed)
Contacted pt husband back, he stated also stated her tremors has worsen, she can barely walk and she does not sleep long. If she goes to bed at 9/8 she will sleep until 3am and be up the rest of the night. I advised him to also see PCP regarding urination.  Do you have any other suggestions ?

## 2021-03-11 NOTE — Addendum Note (Signed)
Addended by: York Spaniel on: 03/11/2021 04:59 PM   Modules accepted: Orders

## 2021-04-24 ENCOUNTER — Telehealth: Payer: Self-pay | Admitting: Neurology

## 2021-04-24 DIAGNOSIS — F02818 Dementia in other diseases classified elsewhere, unspecified severity, with other behavioral disturbance: Secondary | ICD-10-CM

## 2021-04-24 DIAGNOSIS — F0281 Dementia in other diseases classified elsewhere with behavioral disturbance: Secondary | ICD-10-CM

## 2021-04-24 MED ORDER — CARBIDOPA-LEVODOPA 25-100 MG PO TABS: 1.0000 | ORAL_TABLET | Freq: Three times a day (TID) | ORAL | 1 refills | Status: AC

## 2021-04-24 MED ORDER — QUETIAPINE FUMARATE 25 MG PO TABS: ORAL_TABLET | ORAL | 3 refills | Status: AC

## 2021-04-24 NOTE — Telephone Encounter (Signed)
Pt's husband states pt is declining quickly, she is going down hill re: her Parkinson's. Husband states pt's hallucinations are wosening, pt is incontinent, she is unable to talk much.  Pt at max is able to walk 5 feet.  Husband is asking for a call to discuss.

## 2021-04-24 NOTE — Telephone Encounter (Signed)
I called the patient and talk with husband.  The patient has had worsening mobility increased confusion and hallucinations.  He has taken off of the Sinemet CR 50/200 mg tablets, she is now on a 25/100 mg tablets taking 1 3 times daily.  She takes clonazepam 1.5 mg at night which helps her sleep along with the Seroquel taking 25 mg at 4 PM and at bedtime and a half a tablet in the morning.  In the past she has had problems with bladder infections.  I discussed getting her in the office for blood work and urinalysis, but the husband wants to "keep her comfortable".  We will go up on the Seroquel taking 1 full tablet 3 times daily, could add another 0.5 mg of clonazepam in the morning.  Keep the Sinemet at the current dose.  I discussed getting hospice involved, he will let me know if he wishes to have this done.

## 2021-04-25 NOTE — Telephone Encounter (Signed)
Pt's husband called back and stated that he has thought about the Hospice order and he has decided that he would like provider to go ahead and put in the order for the pt. Please advise.

## 2021-04-25 NOTE — Telephone Encounter (Signed)
I called the patient, I will make the referral.

## 2021-04-25 NOTE — Addendum Note (Signed)
Addended by: York Spaniel on: 04/25/2021 04:42 PM   Modules accepted: Orders

## 2021-04-25 NOTE — Telephone Encounter (Signed)
FYI. Please advise.

## 2021-05-01 ENCOUNTER — Ambulatory Visit: Payer: Medicare Other | Admitting: Pharmacist

## 2021-05-02 ENCOUNTER — Ambulatory Visit: Payer: Medicare Other | Admitting: Pharmacist

## 2021-06-18 DEATH — deceased

## 2021-07-08 ENCOUNTER — Ambulatory Visit: Payer: Medicare Other | Admitting: Neurology

## 2022-06-04 IMAGING — CT CT CERVICAL SPINE W/O CM
3 of 4 series · 11 of 35 positions shown, 13 images · non-contrast
Comparison: None.

CLINICAL DATA: Delirium, altered mental status, multiple falls,

EXAM:
CT HEAD WITHOUT CONTRAST
CT CERVICAL SPINE WITHOUT CONTRAST
TECHNIQUE: Multidetector CT imaging of the head and cervical spine was
performed following the standard protocol without intravenous
contrast. Multiplanar CT image reconstructions of the cervical spine
were also generated.

[Series 6: orthogonal bone · axial · 0.21mm/px · z∈[-324,-208]mm · 3 of 94 slices shown, 4 images]
[im 16/94  soft-tissue]
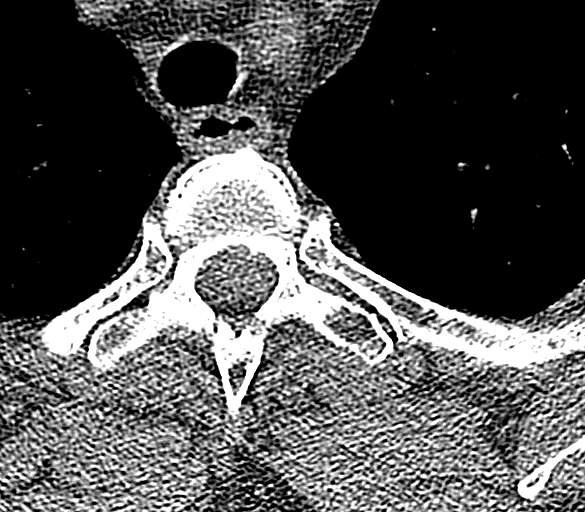
[im 16/94  bone]
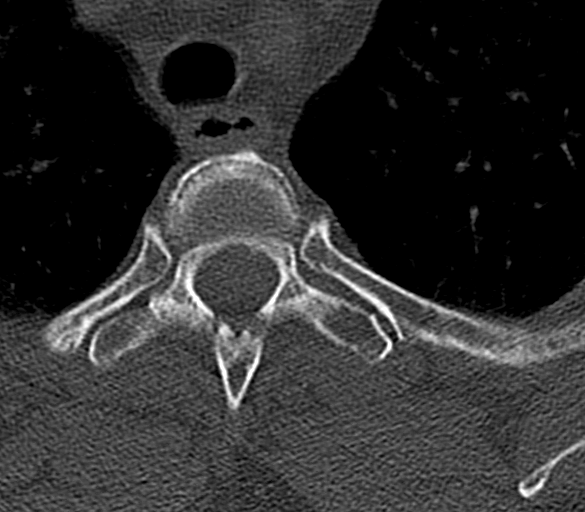
[im 47/94  bone]
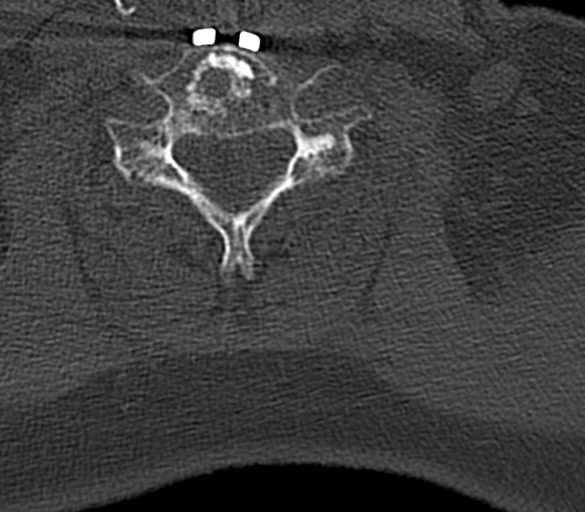
[im 78/94  bone]
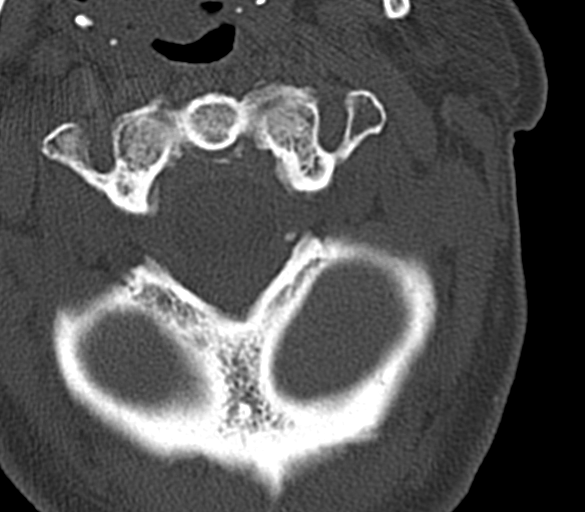

[Series 7: coronal bone · coronal · 0.27mm/px · 3 of 55 slices shown]
[im 11/55  bone]
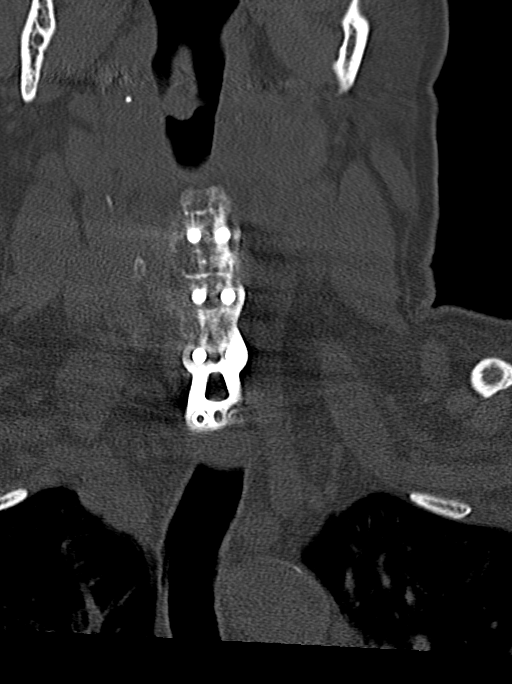
[im 22/55  bone]
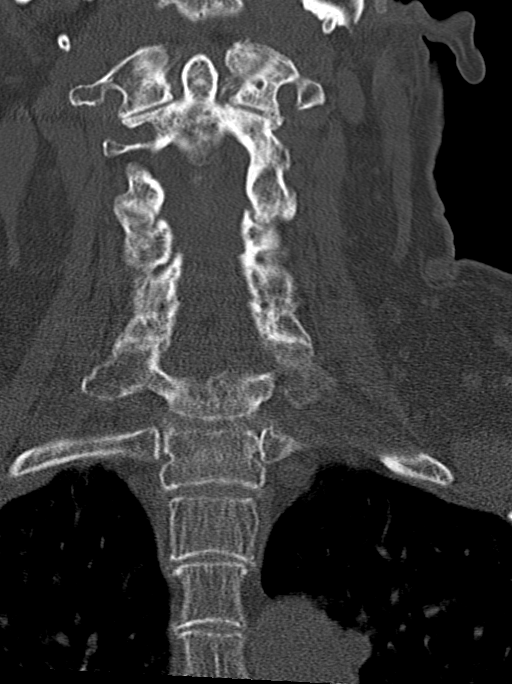
[im 33/55  bone]
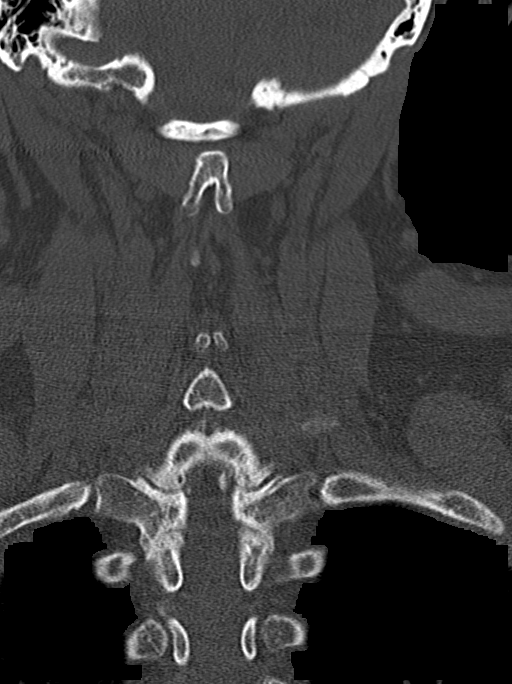

[Series 8: sagittal bone · sagittal · 0.22mm/px · 5 of 58 slices shown, 6 images]
[im 20/58  bone]
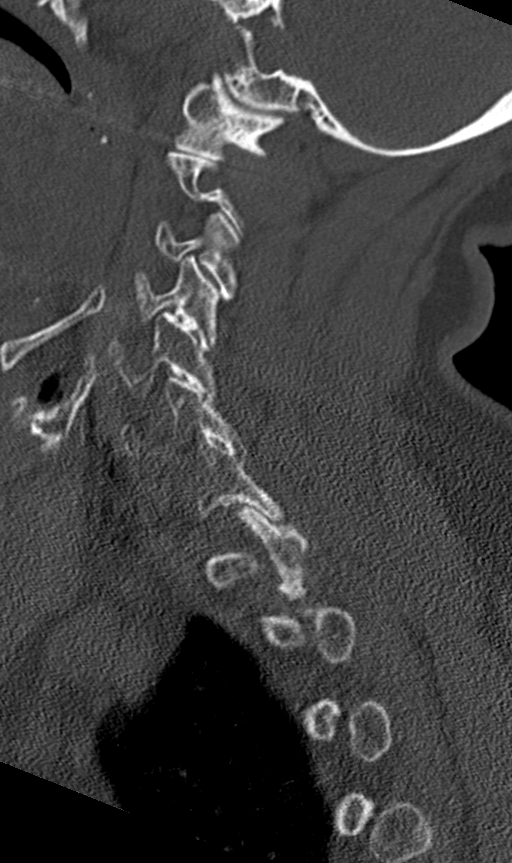
[im 24/58  bone]
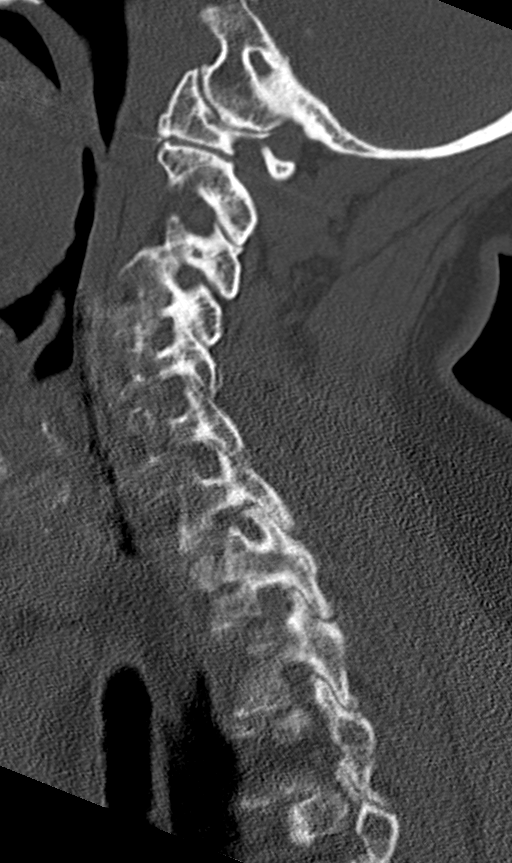
[im 29/58  soft-tissue]
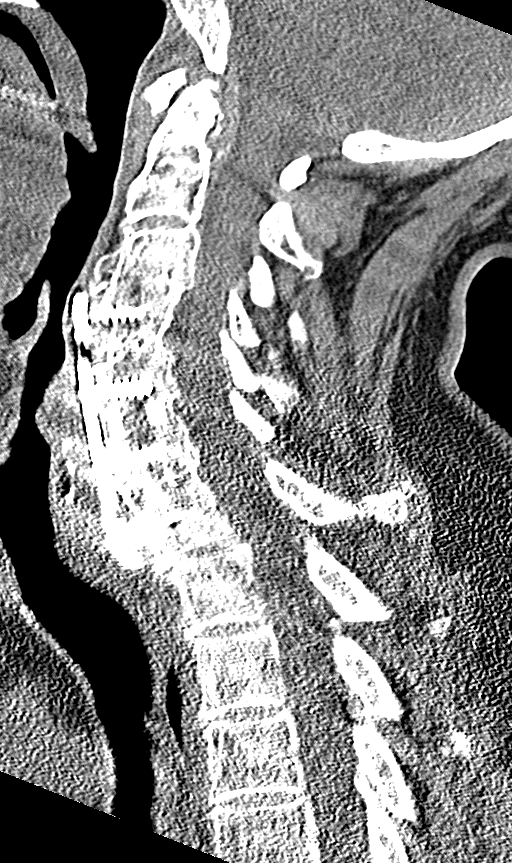
[im 29/58  bone]
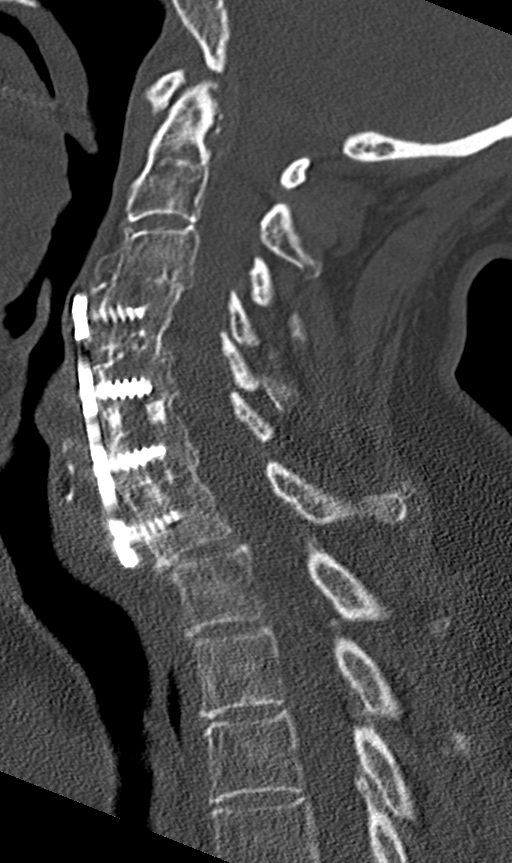
[im 34/58  bone]
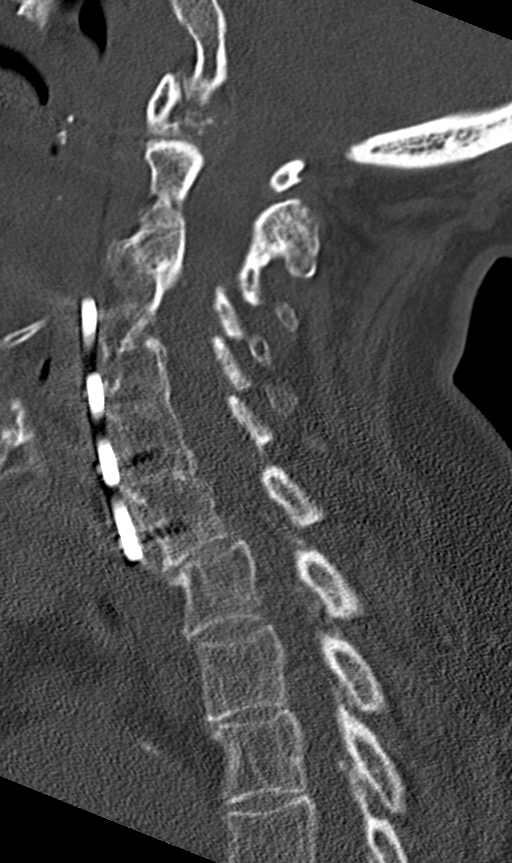
[im 39/58  bone]
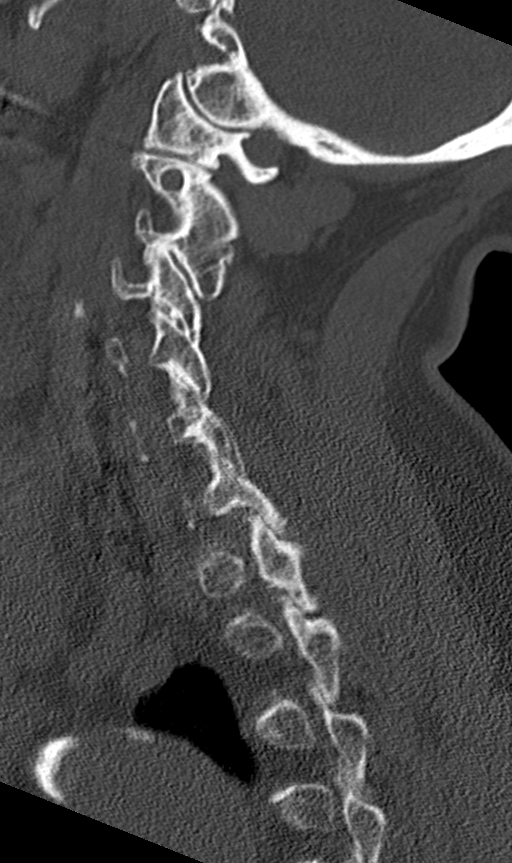

[11 of 35 positions shown; findings below may reference images not displayed]

FINDINGS: CT HEAD FINDINGS

Brain: Normal anatomic configuration. Parenchymal volume loss is
commensurate with the patient's age. Moderate periventricular white
matter changes are present likely reflecting the sequela of small
vessel ischemia. No abnormal intra or extra-axial mass lesion or
fluid collection. No abnormal mass effect or midline shift. No
evidence of acute intracranial hemorrhage or infarct. Ventricular
size is normal. Cerebellum unremarkable.

Vascular: No asymmetric hyperdense vasculature at the skull base.

Skull: Intact

Sinuses/Orbits: Paranasal sinuses are clear. Orbits are
unremarkable.

Other: Mastoid air cells and middle ear cavities are clear.

CT CERVICAL SPINE FINDINGS

Alignment: Anterior cervical discectomy infusion has been performed
at C3-C7 with solid fusion of the involved segments with
instrumentation noted at C4-C7. There is 2-3 mm anterolisthesis of
C7 upon T1, likely degenerative in nature.

Skull base and vertebrae: The craniocervical junction is
unremarkable. The atlantodental interval is not widened. There is
ankylosis of the a left C2-3 facet joint. There is no acute fracture
of the cervical spine.

Soft tissues and spinal canal: There is mild central canal stenosis
at C3-4 secondary to posterior callus formation resulting in
effacement of the anterior canal space and mild flattening of the
thecal sac. The AP diameter of the spinal canal at this level is 8-9
mm at minimum. The spinal canal is otherwise widely patent. The
prevertebral soft tissues are not thickened. No paraspinal fluid
collections are identified. No canal hematoma is identified.

Disc levels: There is intervertebral disc space narrowing and
endplate remodeling at the residual C7-T1 intervertebral disc space
in keeping with moderate degenerative disc disease. Mild endplate
changes noted at C2-3 are in keeping with mild degenerative disc
disease at this level. Facet arthropathy results in multilevel mild
neuroforaminal narrowing, best appreciated on the right at C4-5 and
bilaterally at C5-6.

Upper chest: Unremarkable

Other: None
IMPRESSION: No acute intracranial abnormality.  No calvarial fracture.

Moderate periventricular white matter changes most in keeping with
small vessel ischemia.

No acute fracture or listhesis of the cervical spine.

Anterior cervical discectomy and fusion with instrumentation C3-C7.
Mild central canal stenosis C3-4. Mild multilevel facet arthrosis
resulting in mild neuroforaminal narrowing as described above.
